# Patient Record
Sex: Male | Born: 1955 | Hispanic: No | Marital: Married | Smoking: Current every day smoker
Health system: Southern US, Community
[De-identification: ages and names within clinical notes are randomized; demographics above are authoritative.]

## PROBLEM LIST (undated history)

## (undated) DIAGNOSIS — F329 Major depressive disorder, single episode, unspecified: Secondary | ICD-10-CM

## (undated) DIAGNOSIS — J439 Emphysema, unspecified: Secondary | ICD-10-CM

## (undated) DIAGNOSIS — M1990 Unspecified osteoarthritis, unspecified site: Secondary | ICD-10-CM

## (undated) DIAGNOSIS — J449 Chronic obstructive pulmonary disease, unspecified: Secondary | ICD-10-CM

## (undated) DIAGNOSIS — N200 Calculus of kidney: Secondary | ICD-10-CM

## (undated) DIAGNOSIS — I219 Acute myocardial infarction, unspecified: Secondary | ICD-10-CM

## (undated) DIAGNOSIS — F32A Depression, unspecified: Secondary | ICD-10-CM

## (undated) DIAGNOSIS — M109 Gout, unspecified: Secondary | ICD-10-CM

## (undated) HISTORY — DX: Gout, unspecified: M10.9

## (undated) HISTORY — DX: Depression, unspecified: F32.A

## (undated) HISTORY — DX: Unspecified osteoarthritis, unspecified site: M19.90

## (undated) HISTORY — DX: Emphysema, unspecified: J43.9

## (undated) HISTORY — DX: Chronic obstructive pulmonary disease, unspecified: J44.9

## (undated) HISTORY — DX: Acute myocardial infarction, unspecified: I21.9

## (undated) HISTORY — DX: Calculus of kidney: N20.0

## (undated) HISTORY — PX: CARDIAC CATHETERIZATION: SHX172

---
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
## 2019-09-12 ENCOUNTER — Ambulatory Visit: Payer: HRSA Program | Attending: Internal Medicine

## 2019-09-12 DIAGNOSIS — Z20822 Contact with and (suspected) exposure to covid-19: Secondary | ICD-10-CM

## 2019-09-12 DIAGNOSIS — Z20828 Contact with and (suspected) exposure to other viral communicable diseases: Secondary | ICD-10-CM | POA: Insufficient documentation

## 2019-09-13 LAB — NOVEL CORONAVIRUS, NAA: SARS-CoV-2, NAA: NOT DETECTED

## 2019-09-25 ENCOUNTER — Telehealth: Payer: Self-pay

## 2019-09-25 NOTE — Telephone Encounter (Signed)
Confirmed appointment with patient. klh °

## 2019-09-27 ENCOUNTER — Encounter (INDEPENDENT_AMBULATORY_CARE_PROVIDER_SITE_OTHER): Payer: Self-pay

## 2019-09-27 ENCOUNTER — Other Ambulatory Visit: Payer: Self-pay

## 2019-09-27 ENCOUNTER — Ambulatory Visit: Payer: Medicare Other | Admitting: Adult Health

## 2019-09-27 ENCOUNTER — Encounter: Payer: Self-pay | Admitting: Adult Health

## 2019-09-27 VITALS — BP 118/73 | HR 60 | Temp 96.7°F | Resp 16 | Ht 73.0 in | Wt 231.0 lb

## 2019-09-27 DIAGNOSIS — J449 Chronic obstructive pulmonary disease, unspecified: Secondary | ICD-10-CM | POA: Diagnosis not present

## 2019-09-27 DIAGNOSIS — Z0000 Encounter for general adult medical examination without abnormal findings: Secondary | ICD-10-CM | POA: Diagnosis not present

## 2019-09-27 DIAGNOSIS — F17210 Nicotine dependence, cigarettes, uncomplicated: Secondary | ICD-10-CM

## 2019-09-27 DIAGNOSIS — R5383 Other fatigue: Secondary | ICD-10-CM

## 2019-09-27 DIAGNOSIS — M1712 Unilateral primary osteoarthritis, left knee: Secondary | ICD-10-CM

## 2019-09-27 MED ORDER — DICLOFENAC SODIUM 1 % EX GEL: 4.0000 g | Freq: Four times a day (QID) | CUTANEOUS | 0 refills | Status: DC

## 2019-09-27 NOTE — Progress Notes (Signed)
[facility] [place], [place] [address]  Internal MEDICINE  Office Visit Note  Patient Name: [name] [name]  [id number]  [id number]  Date of Service: 09/30/2019   Complaints/HPI Pt is here for establishment of PCP. Chief Complaint  Patient presents with  . New Patient (Initial Visit)  . Knee Pain    left   HPI  Patient is here today to establish care as a new patient. He recently moved to the area 5 weeks ago from [place]. 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 He has a sponsor now that is local and reports he has been sober for 2 years as of January 1st. His relatives such as mother and father and older brothers all suffer from addiction and still suffer. He has been diagnosed with COPD, he has been smoking since he was 64 years old but has recently started to back off. He was started on Chantix before leaving [place] and is down to smoking about 10 cigarettes a day. He has recently stopped taking Chantix as he was starting to have terrible nightmares as a side effect from the medication. He has never used a daily inhaler for his COPD but had an albuterol inhaler that he used on as needed basis, but has been without a prescription for several months. Feels as though his COPD is well controlled without the use of a daily inhaler. Reports a "smokers" cough that has been persistent for the last year, non-productive cough. He has not tried any treatments for the cough. Was treated in [place] for hepatitis C, completed the treatment course. His only acute complaint today is left knee pain, he was told in [place] that he had arthritis in that knee and was given a cortisone injection about 3-4 months ago. He feels as though that relieved his pain but it is starting to slowly  come back. He reports taking ibuprofen as well as tylenol arthritis OTC for his knee pain that provides him with some relief. He would like to see about getting another cortisone injection, I explained to him we would need to refer him to orthopaedics for this.   Current Medication: Outpatient Encounter Medications as of 09/27/2019  Medication Sig  . ibuprofen (ADVIL) 800 MG tablet Take 800 mg by mouth 2 (two) times daily as needed.  . diclofenac Sodium (VOLTAREN) 1 % GEL Apply 4 g topically 4 (four) times daily.   No facility-administered encounter medications on file as of 09/27/2019.    Surgical History: History reviewed. No pertinent surgical history.  Medical History: Past Medical History:  Diagnosis Date  . Arthritis   . COPD (chronic obstructive pulmonary disease) ([place])   . Depression   . Emphysema of lung ([place])     Family History: Family History  Problem Relation Age of Onset  . Alcohol abuse Mother   . Alcohol abuse Father   . Alcohol abuse Brother     Social History   Socioeconomic History  . Marital status: Single    Spouse name: Not on file  . Number of children: Not on file  . Years of education: Not on file  . Highest education level: Not on file  Occupational History  . Not on file  Tobacco Use  . Smoking status: Current Every Day Smoker  Types: Cigarettes  . Smokeless tobacco: Never Used  . Tobacco comment: 10 a day   Substance and Sexual Activity  . Alcohol use: Not Currently  . Drug use: Never  . Sexual activity: Not on file  Other Topics Concern  . Not on file  Social History Narrative  . Not on file   Social Determinants of Health   Financial Resource Strain:   . Difficulty of Paying Living Expenses: Not on file  Food Insecurity:   . Worried About Charity fundraiser in the Last Year: Not on file  . Ran Out of Food in the Last Year: Not on file  Transportation Needs:   . Lack of Transportation (Medical): Not on file  . Lack of  Transportation (Non-Medical): Not on file  Physical Activity:   . Days of Exercise per Week: Not on file  . Minutes of Exercise per Session: Not on file  Stress:   . Feeling of Stress : Not on file  Social Connections:   . Frequency of Communication with Friends and Family: Not on file  . Frequency of Social Gatherings with Friends and Family: Not on file  . Attends Religious Services: Not on file  . Active Member of Clubs or Organizations: Not on file  . Attends Archivist Meetings: Not on file  . Marital Status: Not on file  Intimate Partner Violence:   . Fear of Current or Ex-Partner: Not on file  . Emotionally Abused: Not on file  . Physically Abused: Not on file  . Sexually Abused: Not on file     Review of Systems  Constitutional: Negative.  Negative for chills, fatigue and unexpected weight change.  HENT: Negative.  Negative for congestion, rhinorrhea, sneezing and sore throat.   Eyes: Negative for redness.  Respiratory: Negative.  Negative for cough, chest tightness and shortness of breath.   Cardiovascular: Negative.  Negative for chest pain and palpitations.  Gastrointestinal: Negative.  Negative for abdominal pain, constipation, diarrhea, nausea and vomiting.  Endocrine: Negative.   Genitourinary: Negative.  Negative for dysuria and frequency.  Musculoskeletal: Negative.  Negative for arthralgias, back pain, joint swelling and neck pain.       Left knee pain  Skin: Negative.  Negative for rash.  Allergic/Immunologic: Negative.   Neurological: Negative.  Negative for tremors and numbness.  Hematological: Negative for adenopathy. Does not bruise/bleed easily.  Psychiatric/Behavioral: Negative.  Negative for behavioral problems, sleep disturbance and suicidal ideas. The patient is not nervous/anxious.       Vital Signs: BP 118/73   Pulse 60   Temp (!) 96.7 F (35.9 C)   Resp 16   Ht 6\' 1"  (1.854 m)   Wt 231 lb (104.8 kg)   SpO2 97%   BMI 30.48 kg/m     Physical Exam Vitals and nursing note reviewed.  Constitutional:      General: He is not in acute distress.    Appearance: He is well-developed. He is not diaphoretic.  HENT:     Head: Normocephalic and atraumatic.     Mouth/Throat:     Pharynx: No oropharyngeal exudate.  Eyes:     Pupils: Pupils are equal, round, and reactive to light.  Neck:     Thyroid: No thyromegaly.     Vascular: No JVD.     Trachea: No tracheal deviation.  Cardiovascular:     Rate and Rhythm: Normal rate and regular rhythm.     Heart sounds: Normal heart sounds. No murmur.  No friction rub. No gallop.   Pulmonary:     Effort: Pulmonary effort is normal. No respiratory distress.     Breath sounds: Normal breath sounds. No wheezing or rales.  Chest:     Chest wall: No tenderness.  Abdominal:     Palpations: Abdomen is soft.     Tenderness: There is no abdominal tenderness. There is no guarding.  Musculoskeletal:        General: Normal range of motion.     Cervical back: Normal range of motion and neck supple.  Lymphadenopathy:     Cervical: No cervical adenopathy.  Skin:    General: Skin is warm and dry.  Neurological:     Mental Status: He is alert and oriented to person, place, and time.     Cranial Nerves: No cranial nerve deficit.  Psychiatric:        Behavior: Behavior normal.        Thought Content: Thought content normal.        Judgment: Judgment normal.      Assessment/Plan: 1. Chronic obstructive pulmonary disease, unspecified COPD type ([place]) Cigarette smoker since age of 25, never used a daily inhaler has only been prescribed albuterol as a rescue inhaler that he rarely used. Does not have a current prescription for albuterol inhaler. States his last PFT was about a year ago. - Pulmonary function test; Future  2. Arthritis of left knee Had a cortisone injection 3-4 months ago in [place]. Is not ready at this point for surgery as he would like to continue with the cortisone  injections. - Ambulatory referral to Orthopedic Surgery  3. Cigarette nicotine dependence without complication Smoking cessation counseling: 1. Pt acknowledges the risks of long term smoking, she will try to quite smoking. 2. Options for different medications including nicotine products, chewing gum, patch etc, Wellbutrin and Chantix is discussed 3. Goal and date of compete cessation is discussed 4. Total time spent in smoking cessation is 15 min.   4. Encounter for medical examination to establish care Will follow-up with patient and schedule a physical to discuss labs at that appointment. - Lipid Panel With LDL/HDL Ratio - TSH - T4, free - Comprehensive metabolic panel - Urinalysis - PSA  5. Other fatigue - CBC with Differential/Platelet   General Counseling: Cecile verbalizes understanding of the findings of todays visit and agrees with plan of treatment. I have discussed any further diagnostic evaluation that may be needed or ordered today. We also reviewed his medications today. he has been encouraged to call the office with any questions or concerns that should arise related to todays visit.  Orders Placed This Encounter  Procedures  . CBC with Differential/Platelet  . Lipid Panel With LDL/HDL Ratio  . TSH  . T4, free  . Comprehensive metabolic panel  . Urinalysis  . PSA  . Ambulatory referral to Orthopedic Surgery  . Pulmonary function test    Meds ordered this encounter  Medications  . diclofenac Sodium (VOLTAREN) 1 % GEL    Sig: Apply 4 g topically 4 (four) times daily.    Dispense:  350 g    Refill:  0    Time spent:30 Minutes   This patient was seen by Orson Gear AGNP-C in Collaboration with Dr Lavera Guise as a part of collaborative care agreement  Kendell Bane AGNP-C Internal Medicine

## 2019-10-17 ENCOUNTER — Ambulatory Visit: Payer: Medicare Other | Admitting: Internal Medicine

## 2019-10-29 ENCOUNTER — Telehealth: Payer: Self-pay

## 2019-10-29 NOTE — Telephone Encounter (Signed)
Confirmed appointment on 10/31/2019 with patient and screened for covid. klh

## 2019-10-30 ENCOUNTER — Ambulatory Visit: Payer: Medicare Other | Admitting: Internal Medicine

## 2019-10-31 ENCOUNTER — Ambulatory Visit: Payer: Medicare Other | Admitting: Internal Medicine

## 2019-11-02 ENCOUNTER — Telehealth: Payer: Self-pay

## 2019-11-02 NOTE — Telephone Encounter (Signed)
Called lmom informing patient need to reschedule appointment on 11/12/2019 after the pft is done on 11/14/2019. klh

## 2019-11-03 LAB — TSH: TSH: 1.34 u[IU]/mL (ref 0.450–4.500)

## 2019-11-03 LAB — CBC WITH DIFFERENTIAL/PLATELET
Basophils Absolute: 0 10*3/uL (ref 0.0–0.2)
Basos: 0 %
EOS (ABSOLUTE): 0.2 10*3/uL (ref 0.0–0.4)
Eos: 2 %
Hematocrit: 47.7 % (ref 37.5–51.0)
Hemoglobin: 15.8 g/dL (ref 13.0–17.7)
Immature Grans (Abs): 0 10*3/uL (ref 0.0–0.1)
Immature Granulocytes: 0 %
Lymphocytes Absolute: 2.1 10*3/uL (ref 0.7–3.1)
Lymphs: 25 %
MCH: 29 pg (ref 26.6–33.0)
MCHC: 33.1 g/dL (ref 31.5–35.7)
MCV: 88 fL (ref 79–97)
Monocytes Absolute: 0.7 10*3/uL (ref 0.1–0.9)
Monocytes: 9 %
Neutrophils Absolute: 5.4 10*3/uL (ref 1.4–7.0)
Neutrophils: 64 %
Platelets: 204 10*3/uL (ref 150–450)
RBC: 5.44 x10E6/uL (ref 4.14–5.80)
RDW: 12.6 % (ref 11.6–15.4)
WBC: 8.4 10*3/uL (ref 3.4–10.8)

## 2019-11-03 LAB — COMPREHENSIVE METABOLIC PANEL
ALT: 26 IU/L (ref 0–44)
AST: 17 IU/L (ref 0–40)
Albumin/Globulin Ratio: 1.8 (ref 1.2–2.2)
Albumin: 4.7 g/dL (ref 3.8–4.8)
Alkaline Phosphatase: 80 IU/L (ref 39–117)
BUN/Creatinine Ratio: 13 (ref 10–24)
BUN: 15 mg/dL (ref 8–27)
Bilirubin Total: 0.2 mg/dL (ref 0.0–1.2)
CO2: 26 mmol/L (ref 20–29)
Calcium: 9.3 mg/dL (ref 8.6–10.2)
Chloride: 102 mmol/L (ref 96–106)
Creatinine, Ser: 1.15 mg/dL (ref 0.76–1.27)
GFR calc Af Amer: 76 mL/min/{1.73_m2} (ref >59)
GFR calc non Af Amer: 66 mL/min/{1.73_m2} (ref >59)
Globulin, Total: 2.6 g/dL (ref 1.5–4.5)
Glucose: 84 mg/dL (ref 65–99)
Potassium: 4.4 mmol/L (ref 3.5–5.2)
Sodium: 140 mmol/L (ref 134–144)
Total Protein: 7.3 g/dL (ref 6.0–8.5)

## 2019-11-03 LAB — LIPID PANEL WITH LDL/HDL RATIO
Cholesterol, Total: 167 mg/dL (ref 100–199)
HDL: 38 mg/dL — ABNORMAL LOW (ref >39)
LDL Chol Calc (NIH): 108 mg/dL — ABNORMAL HIGH (ref 0–99)
LDL/HDL Ratio: 2.8 ratio (ref 0.0–3.6)
Triglycerides: 118 mg/dL (ref 0–149)
VLDL Cholesterol Cal: 21 mg/dL (ref 5–40)

## 2019-11-03 LAB — PSA: Prostate Specific Ag, Serum: 7.5 ng/mL — ABNORMAL HIGH (ref 0.0–4.0)

## 2019-11-03 LAB — T4, FREE: Free T4: 1.28 ng/dL (ref 0.82–1.77)

## 2019-11-06 ENCOUNTER — Telehealth: Payer: Self-pay

## 2019-11-06 NOTE — Telephone Encounter (Signed)
Called confirmed appointment on 11/08/2019 and screened for covid. klh 

## 2019-11-07 ENCOUNTER — Telehealth: Payer: Self-pay

## 2019-11-07 NOTE — Telephone Encounter (Signed)
Confirmed virtual visit on 11/08/2019. klh

## 2019-11-08 ENCOUNTER — Ambulatory Visit: Payer: Medicare Other | Admitting: Adult Health

## 2019-11-08 ENCOUNTER — Other Ambulatory Visit: Payer: Self-pay

## 2019-11-12 ENCOUNTER — Ambulatory Visit: Payer: Medicare Other | Admitting: Internal Medicine

## 2019-11-14 ENCOUNTER — Other Ambulatory Visit: Payer: Self-pay

## 2019-11-14 ENCOUNTER — Other Ambulatory Visit: Payer: Self-pay | Admitting: Adult Health

## 2019-11-14 ENCOUNTER — Ambulatory Visit: Payer: Medicare Other | Admitting: Internal Medicine

## 2019-11-14 DIAGNOSIS — R0602 Shortness of breath: Secondary | ICD-10-CM

## 2019-11-14 DIAGNOSIS — R972 Elevated prostate specific antigen [PSA]: Secondary | ICD-10-CM

## 2019-11-14 LAB — PULMONARY FUNCTION TEST

## 2019-11-14 MED ORDER — BUPROPION HCL ER (SR) 150 MG PO TB12: 150.0000 mg | ORAL_TABLET | Freq: Two times a day (BID) | ORAL | 2 refills | Status: DC

## 2019-11-14 NOTE — Progress Notes (Signed)
Spoke with patient about elevated PSA.  Sent referral for urology.  Also sent RX for Wellbutrin to assist with smoking cessation per patient request.

## 2019-11-17 NOTE — Procedures (Signed)
[place], [id number]  DATE OF SERVICE: November 14, 2019  Complete Pulmonary Function Testing Interpretation:  FINDINGS:  Forced vital capacity is normal.  The FEV1 is normal.  FEV1 FVC ratio is normal.  Postbronchodilator no significant change in the FEV1 clinical improvement may still occur in the absence of spirometric improvement.  Total lung capacity is normal.  Residual volume is normal.  Residual in total lung capacity ratio is increased.  FRC is normal.  DLCO is moderately decreased.  IMPRESSION:  Pulmonary function study is consistent with normal spirometry and lung volumes the DLCO is moderately decreased.  Clinical correlation is recommended  Allyne Gee, MD [facility] Pulmonary Critical Care Medicine Sleep Medicine

## 2019-11-29 ENCOUNTER — Telehealth: Payer: Self-pay

## 2019-11-29 NOTE — Telephone Encounter (Signed)
Called lmom informing patient of appointment on 12/03/2019. klh

## 2019-12-03 ENCOUNTER — Encounter: Payer: Self-pay | Admitting: Adult Health

## 2019-12-03 ENCOUNTER — Ambulatory Visit (INDEPENDENT_AMBULATORY_CARE_PROVIDER_SITE_OTHER): Payer: Medicare Other | Admitting: Adult Health

## 2019-12-03 ENCOUNTER — Other Ambulatory Visit: Payer: Self-pay

## 2019-12-03 VITALS — BP 116/67 | HR 63 | Temp 97.1°F | Resp 16 | Ht 73.0 in | Wt 227.0 lb

## 2019-12-03 DIAGNOSIS — R300 Dysuria: Secondary | ICD-10-CM | POA: Diagnosis not present

## 2019-12-03 DIAGNOSIS — F410 Panic disorder [episodic paroxysmal anxiety] without agoraphobia: Secondary | ICD-10-CM | POA: Diagnosis not present

## 2019-12-03 DIAGNOSIS — R972 Elevated prostate specific antigen [PSA]: Secondary | ICD-10-CM | POA: Diagnosis not present

## 2019-12-03 DIAGNOSIS — Z0001 Encounter for general adult medical examination with abnormal findings: Secondary | ICD-10-CM | POA: Diagnosis not present

## 2019-12-03 DIAGNOSIS — F17210 Nicotine dependence, cigarettes, uncomplicated: Secondary | ICD-10-CM | POA: Diagnosis not present

## 2019-12-03 DIAGNOSIS — R0602 Shortness of breath: Secondary | ICD-10-CM

## 2019-12-03 MED ORDER — BUSPIRONE HCL 10 MG PO TABS: 10.0000 mg | ORAL_TABLET | Freq: Two times a day (BID) | ORAL | 1 refills | Status: DC

## 2019-12-03 NOTE — Progress Notes (Signed)
[facility] [place], [place] [address]  Internal MEDICINE  Office Visit Note  Patient Name: [name] [name]  [id number]  [id number]  Date of Service: 12/30/2019  Chief Complaint  Patient presents with  . Medicare Wellness    test results  . Depression     HPI Pt is here for routine health maintenance examination. He expresses concern today about anxiety/panic attacks. He is struggling with not being able to see his teenage son due to issues with his ex-wife and financial strain. He reports feeling his body jump and having extreme anxiety during the day, while resting. He reports he is sleeping well at this time. His blood pressure is stable today, denies chest pain or palpitations. Currently smoking 0.5 pack of cigarettes per day, has shortness of breath at baseline but this has been stable over time, not on oxygen therapy or using any inhalers. Scheduled to see urologist the first of next month due to elevated PSA levels on routine blood work, also adding to his anxiety.  Current Medication: Outpatient Encounter Medications as of 12/03/2019  Medication Sig  . buPROPion (WELLBUTRIN SR) 150 MG 12 hr tablet Take 1 tablet (150 mg total) by mouth 2 (two) times daily.  . diclofenac Sodium (VOLTAREN) 1 % GEL Apply 4 g topically 4 (four) times daily.  Marland Kitchen ibuprofen (ADVIL) 800 MG tablet Take 800 mg by mouth 2 (two) times daily as needed.  . busPIRone (BUSPAR) 10 MG tablet Take 1 tablet (10 mg total) by mouth 2 (two) times daily.   No facility-administered encounter medications on file as of 12/03/2019.    Surgical History: History reviewed. No pertinent surgical history.  Medical History: Past Medical History:  Diagnosis Date  . Arthritis   . COPD (chronic obstructive pulmonary disease) ([place])   . Depression   . Emphysema of lung ([place])     Family History: Family History  Problem Relation Age of Onset  . Alcohol abuse Mother   . Alcohol abuse Father    . Alcohol abuse Brother       Review of Systems  Constitutional: Negative.  Negative for chills, fatigue and unexpected weight change.  HENT: Negative.  Negative for congestion, rhinorrhea, sneezing and sore throat.   Eyes: Negative for redness.  Respiratory: Positive for shortness of breath. Negative for cough and chest tightness.   Cardiovascular: Negative.  Negative for chest pain and palpitations.  Gastrointestinal: Negative.  Negative for abdominal pain, constipation, diarrhea, nausea and vomiting.  Endocrine: Negative.   Genitourinary: Negative.  Negative for dysuria and frequency.  Musculoskeletal: Negative.  Negative for arthralgias, back pain, joint swelling and neck pain.  Skin: Negative.  Negative for rash.  Allergic/Immunologic: Negative.   Neurological: Negative.  Negative for tremors and numbness.  Hematological: Negative for adenopathy. Does not bruise/bleed easily.  Psychiatric/Behavioral: Negative for behavioral problems, sleep disturbance and suicidal ideas. The patient is nervous/anxious.        Panic attacks     Vital Signs: BP 116/67   Pulse 63   Temp (!) 97.1 F (36.2 C)   Resp 16   Ht 6\' 1"  (1.854 m)   Wt 227 lb (103 kg)   SpO2 98%   BMI 29.95 kg/m    Physical Exam Vitals and nursing note reviewed.  Constitutional:      General: He is not in acute distress.    Appearance: He is well-developed. He is not diaphoretic.  HENT:     Head: Normocephalic and atraumatic.  Mouth/Throat:     Pharynx: No oropharyngeal exudate.  Eyes:     Pupils: Pupils are equal, round, and reactive to light.  Neck:     Thyroid: No thyromegaly.     Vascular: No JVD.     Trachea: No tracheal deviation.  Cardiovascular:     Rate and Rhythm: Normal rate and regular rhythm.     Heart sounds: Normal heart sounds. No murmur. No friction rub. No gallop.   Pulmonary:     Effort: Pulmonary effort is normal. No respiratory distress.     Breath sounds: Normal breath  sounds. No wheezing or rales.  Chest:     Chest wall: No tenderness.  Abdominal:     Palpations: Abdomen is soft.     Tenderness: There is no abdominal tenderness. There is no guarding.  Musculoskeletal:        General: Normal range of motion.     Cervical back: Normal range of motion and neck supple.  Lymphadenopathy:     Cervical: No cervical adenopathy.  Skin:    General: Skin is warm and dry.  Neurological:     Mental Status: He is alert and oriented to person, place, and time.     Cranial Nerves: No cranial nerve deficit.  Psychiatric:        Behavior: Behavior normal.        Thought Content: Thought content normal.        Judgment: Judgment normal.      LABS: Recent Results (from the past 2160 hour(s))  CBC with Differential/Platelet     Status: None   Collection Time: 11/02/19  3:53 PM  Result Value Ref Range   WBC 8.4 3.4 - 10.8 x10E3/uL   RBC 5.44 4.14 - 5.80 x10E6/uL   Hemoglobin 15.8 13.0 - 17.7 g/dL   Hematocrit 47.7 37.5 - 51.0 %   MCV 88 79 - 97 fL   MCH 29.0 26.6 - 33.0 pg   MCHC 33.1 31.5 - 35.7 g/dL   RDW 12.6 11.6 - 15.4 %   Platelets 204 150 - 450 x10E3/uL   Neutrophils 64 Not Estab. %   Lymphs 25 Not Estab. %   Monocytes 9 Not Estab. %   Eos 2 Not Estab. %   Basos 0 Not Estab. %   Neutrophils Absolute 5.4 1.4 - 7.0 x10E3/uL   Lymphocytes Absolute 2.1 0.7 - 3.1 x10E3/uL   Monocytes Absolute 0.7 0.1 - 0.9 x10E3/uL   EOS (ABSOLUTE) 0.2 0.0 - 0.4 x10E3/uL   Basophils Absolute 0.0 0.0 - 0.2 x10E3/uL   Immature Granulocytes 0 Not Estab. %   Immature Grans (Abs) 0.0 0.0 - 0.1 x10E3/uL  Lipid Panel With LDL/HDL Ratio     Status: Abnormal   Collection Time: 11/02/19  3:53 PM  Result Value Ref Range   Cholesterol, Total 167 100 - 199 mg/dL   Triglycerides 118 0 - 149 mg/dL   HDL 38 (L) >39 mg/dL   VLDL Cholesterol Cal 21 5 - 40 mg/dL   LDL Chol Calc (NIH) 108 (H) 0 - 99 mg/dL   LDL/HDL Ratio 2.8 0.0 - 3.6 ratio    Comment:                                      LDL/HDL Ratio  Men  Women                               1/2 Avg.Risk  1.0    1.5                                   Avg.Risk  3.6    3.2                                2X Avg.Risk  6.2    5.0                                3X Avg.Risk  8.0    6.1   TSH     Status: None   Collection Time: 11/02/19  3:53 PM  Result Value Ref Range   TSH 1.340 0.450 - 4.500 uIU/mL  T4, free     Status: None   Collection Time: 11/02/19  3:53 PM  Result Value Ref Range   Free T4 1.28 0.82 - 1.77 ng/dL  Comprehensive metabolic panel     Status: None   Collection Time: 11/02/19  3:53 PM  Result Value Ref Range   Glucose 84 65 - 99 mg/dL   BUN 15 8 - 27 mg/dL   Creatinine, Ser 1.15 0.76 - 1.27 mg/dL   GFR calc non Af Amer 66 >59 mL/min/1.73   GFR calc Af Amer 76 >59 mL/min/1.73   BUN/Creatinine Ratio 13 10 - 24   Sodium 140 134 - 144 mmol/L   Potassium 4.4 3.5 - 5.2 mmol/L   Chloride 102 96 - 106 mmol/L   CO2 26 20 - 29 mmol/L   Calcium 9.3 8.6 - 10.2 mg/dL   Total Protein 7.3 6.0 - 8.5 g/dL   Albumin 4.7 3.8 - 4.8 g/dL   Globulin, Total 2.6 1.5 - 4.5 g/dL   Albumin/Globulin Ratio 1.8 1.2 - 2.2   Bilirubin Total 0.2 0.0 - 1.2 mg/dL   Alkaline Phosphatase 80 39 - 117 IU/L   AST 17 0 - 40 IU/L   ALT 26 0 - 44 IU/L  PSA     Status: Abnormal   Collection Time: 11/02/19  3:53 PM  Result Value Ref Range   Prostate Specific Ag, Serum 7.5 (H) 0.0 - 4.0 ng/mL    Comment: Roche ECLIA methodology. According to the American Urological Association, Serum PSA should decrease and remain at undetectable levels after radical prostatectomy. The AUA defines biochemical recurrence as an initial PSA value 0.2 ng/mL or greater followed by a subsequent confirmatory PSA value 0.2 ng/mL or greater. Values obtained with different assay methods or kits cannot be used interchangeably. Results cannot be interpreted as absolute evidence of the presence or absence of  malignant disease.   UA/M w/rflx Culture, Routine     Status: None   Collection Time: 12/03/19 12:00 AM   Specimen: Urine   URINE  Result Value Ref Range   Specific Gravity, UA 1.017 1.005 - 1.030   pH, UA 5.5 5.0 - 7.5   Color, UA Yellow Yellow   Appearance Ur Clear Clear   Leukocytes,UA Negative Negative   Protein,UA Negative Negative/Trace   Glucose, UA Negative Negative   Ketones, UA Negative Negative   RBC,  UA Negative Negative   Bilirubin, UA Negative Negative   Urobilinogen, Ur 1.0 0.2 - 1.0 mg/dL   Nitrite, UA Negative Negative   Microscopic Examination Comment     Comment: Microscopic follows if indicated.   Microscopic Examination See below:     Comment: Microscopic was indicated and was performed.   Urinalysis Reflex Comment     Comment: This specimen will not reflex to a Urine Culture.  Microscopic Examination     Status: None   Collection Time: 12/03/19 12:00 AM   URINE  Result Value Ref Range   WBC, UA 0-5 0 - 5 /hpf   RBC None seen 0 - 2 /hpf   Epithelial Cells (non renal) None seen 0 - 10 /hpf   Casts None seen None seen /lpf   Bacteria, UA None seen None seen/Few    Assessment/Plan: 1. Encounter for general adult medical examination with abnormal findings Well appearing 64 year old male, up to date on PHM.  2. Cigarette nicotine dependence without complication Smoking cessation counseling: 1. Pt acknowledges the risks of long term smoking, she will try to quite smoking. 2. Options for different medications including nicotine products, chewing gum, patch etc, Wellbutrin and Chantix is discussed 3. Goal and date of compete cessation is discussed 4. Total time spent in smoking cessation is 15 min.  3. Anxiety attack Has baseline anxiety that has been controlled with Wellbutrin for many years. Reports several new stressors in his life at this time that are causing panic attack symptoms. Will add Buspar to his daily routine to help with his overall anxiety  level, will follow-up. - busPIRone (BUSPAR) 10 MG tablet; Take 1 tablet (10 mg total) by mouth 2 (two) times daily.  Dispense: 60 tablet; Refill: 1  4. Elevated PSA PSA elevated on routine blood work, scheduled to see urologist beginning of April, will schedule next follow-up appointment closely after urology referral.  5. Shortness of breath Stable at this time, not requiring oxygen therapy or use of inhalers. Will continue to monitor. Most recent PFT WNL.  6. Dysuria - UA/M w/rflx Culture, Routine  General Counseling: [name] [name] verbalizes understanding of the findings of todays visit and agrees with plan of treatment. I have discussed any further diagnostic evaluation that may be needed or ordered today. We also reviewed his medications today. he has been encouraged to call the office with any questions or concerns that should arise related to todays visit.   Orders Placed This Encounter  Procedures  . Microscopic Examination  . UA/M w/rflx Culture, Routine    Meds ordered this encounter  Medications  . busPIRone (BUSPAR) 10 MG tablet    Sig: Take 1 tablet (10 mg total) by mouth 2 (two) times daily.    Dispense:  60 tablet    Refill:  1    Time spent: 30 Minutes   This patient was seen by Orson Gear AGNP-C in Collaboration with Dr Lavera Guise as a part of collaborative care agreement    Kendell Bane AGNP-C Internal Medicine

## 2019-12-04 LAB — MICROSCOPIC EXAMINATION
Bacteria, UA: NONE SEEN
Casts: NONE SEEN /lpf
Epithelial Cells (non renal): NONE SEEN /hpf (ref 0–10)
RBC, Urine: NONE SEEN /hpf (ref 0–2)

## 2019-12-04 LAB — UA/M W/RFLX CULTURE, ROUTINE
Bilirubin, UA: NEGATIVE
Glucose, UA: NEGATIVE
Ketones, UA: NEGATIVE
Leukocytes,UA: NEGATIVE
Nitrite, UA: NEGATIVE
Protein,UA: NEGATIVE
RBC, UA: NEGATIVE
Specific Gravity, UA: 1.017 (ref 1.005–1.030)
Urobilinogen, Ur: 1 mg/dL (ref 0.2–1.0)
pH, UA: 5.5 (ref 5.0–7.5)

## 2019-12-24 ENCOUNTER — Ambulatory Visit: Payer: Medicare Other | Admitting: Internal Medicine

## 2019-12-26 ENCOUNTER — Other Ambulatory Visit: Payer: Self-pay

## 2019-12-26 ENCOUNTER — Ambulatory Visit (INDEPENDENT_AMBULATORY_CARE_PROVIDER_SITE_OTHER): Payer: Medicare Other | Admitting: Urology

## 2019-12-26 ENCOUNTER — Encounter: Payer: Self-pay | Admitting: Urology

## 2019-12-26 VITALS — BP 120/82 | HR 64 | Ht 73.0 in | Wt 225.0 lb

## 2019-12-26 DIAGNOSIS — N401 Enlarged prostate with lower urinary tract symptoms: Secondary | ICD-10-CM | POA: Diagnosis not present

## 2019-12-26 DIAGNOSIS — R972 Elevated prostate specific antigen [PSA]: Secondary | ICD-10-CM | POA: Insufficient documentation

## 2019-12-26 DIAGNOSIS — R350 Frequency of micturition: Secondary | ICD-10-CM | POA: Diagnosis not present

## 2019-12-26 MED ORDER — TAMSULOSIN HCL 0.4 MG PO CAPS: 0.4000 mg | ORAL_CAPSULE | Freq: Every day | ORAL | 0 refills | Status: DC

## 2019-12-26 NOTE — Progress Notes (Signed)
12/26/2019 8:41 AM   [name] [name] 1956-03-19 [id number]  Referring provider: Kendell Bane, NP [place],  [place] [address]  Chief Complaint  Patient presents with  . Elevated PSA    HPI: [name] [name] is a 64 y.o. male seen at the request of [name] Lacks, NP for evaluation of an elevated PSA.  -PSA 11/02/2019 elevated at 7.5 -No prior PSA results for comparison -Denies history elevated PSA or prior urologic problems -Also complains of urinary frequency, urgency, nocturia x2 -Denies UTI, dysuria, gross hematuria -Denies flank, abdominal or pelvic pain   PMH: Past Medical History:  Diagnosis Date  . Arthritis   . COPD (chronic obstructive pulmonary disease) ([place])   . Depression   . Emphysema of lung [facility])     Surgical History: No past surgical history on file.  Home Medications:  Allergies as of 12/26/2019   No Known Allergies     Medication List       Accurate as of December 26, 2019  8:41 AM. If you have any questions, ask your nurse or doctor.        buPROPion 150 MG 12 hr tablet Commonly known as: Wellbutrin SR Take 1 tablet (150 mg total) by mouth 2 (two) times daily.   busPIRone 10 MG tablet Commonly known as: BUSPAR Take 1 tablet (10 mg total) by mouth 2 (two) times daily.   diclofenac Sodium 1 % Gel Commonly known as: Voltaren Apply 4 g topically 4 (four) times daily.   ibuprofen 800 MG tablet Commonly known as: ADVIL Take 800 mg by mouth 2 (two) times daily as needed.       Allergies: No Known Allergies  Family History: Family History  Problem Relation Age of Onset  . Alcohol abuse Mother   . Alcohol abuse Father   . Alcohol abuse Brother     Social History:  reports that he has been smoking cigarettes. He has never used smokeless tobacco. He reports previous alcohol use. He reports that he does not use drugs.   Physical Exam: BP 120/82   Pulse 64   Ht 6\' 1"  (1.854 m)   Wt 225 lb (102.1 kg)   BMI 29.69  kg/m   Constitutional:  Alert and oriented, No acute distress. HEENT: [place] AT, moist mucus membranes.  Trachea midline, no masses. Cardiovascular: No clubbing, cyanosis, or edema. Respiratory: Normal respiratory effort, no increased work of breathing. GU: Prostate 60+ grams, smooth without nodules Skin: No rashes, bruises or suspicious lesions. Neurologic: Grossly intact, no focal deficits, moving all 4 extremities. Psychiatric: Normal mood and affect.   Assessment & Plan:    - Elevated PSA Although PSA is a prostate cancer screening test he was informed that cancer is not the most common cause of an elevated PSA. Other potential causes including BPH and inflammation were discussed. He was informed that the only way to adequately diagnose prostate cancer would be a transrectal ultrasound and biopsy of the prostate. The procedure was discussed including potential risks of bleeding and infection/sepsis. He was also informed that a negative biopsy does not conclusively rule out the possibility that prostate cancer may be present and that continued monitoring is required. The use of newer adjunctive blood tests including PHI and 4kScore were discussed. The use of multiparametric prostate MRI was also discussed however is not typically used for initial evaluation of an elevated PSA. Continued periodic surveillance was also discussed.  Based on his urinary symptoms will start tamsulosin 0.4 mg daily  and obtain a repeat PSA in 1 month.  If PSA remains elevated would recommend prostate MRI.  - Urinary frequency/urgency Most likely secondary to BPH.  Rx tamsulosin as above.   Abbie Sons, [place] [address] [place], [place] [address] [phone]

## 2020-01-10 ENCOUNTER — Telehealth: Payer: Self-pay

## 2020-01-10 NOTE — Telephone Encounter (Signed)
Confirmed appointment on 01/14/2020 and screened for covid. klh 

## 2020-01-14 ENCOUNTER — Other Ambulatory Visit: Payer: Self-pay

## 2020-01-14 ENCOUNTER — Encounter: Payer: Self-pay | Admitting: Adult Health

## 2020-01-14 ENCOUNTER — Ambulatory Visit: Payer: Medicare Other | Admitting: Adult Health

## 2020-01-14 VITALS — BP 94/66 | HR 65 | Temp 97.2°F | Resp 16 | Ht 73.0 in | Wt 223.6 lb

## 2020-01-14 DIAGNOSIS — N528 Other male erectile dysfunction: Secondary | ICD-10-CM

## 2020-01-14 DIAGNOSIS — J449 Chronic obstructive pulmonary disease, unspecified: Secondary | ICD-10-CM

## 2020-01-14 DIAGNOSIS — R972 Elevated prostate specific antigen [PSA]: Secondary | ICD-10-CM

## 2020-01-14 DIAGNOSIS — F17210 Nicotine dependence, cigarettes, uncomplicated: Secondary | ICD-10-CM

## 2020-01-14 MED ORDER — SILDENAFIL CITRATE 100 MG PO TABS: 100.0000 mg | ORAL_TABLET | Freq: Every day | ORAL | 0 refills | Status: DC | PRN

## 2020-01-14 NOTE — Progress Notes (Signed)
[facility], [place] [address]  Internal MEDICINE  Office Visit Note  Patient Name: [name] [name]  [id number]  [id number]  Date of Service: 01/28/2020  Chief Complaint  Patient presents with  . Follow-up  . Depression    HPI  Pt is here to follow up on depression, copd, and anxiety.  He reports he is doing well at this time. His depression/anxiety is currently treated with wellbutrin and buspar.  His copd is managed without inhalers at this time. He has an elevated PSA and has been seen by urology at this time. He reports his urinary frequency has improved with flomax since seeing urology.    Current Medication: Outpatient Encounter Medications as of 01/14/2020  Medication Sig  . buPROPion (WELLBUTRIN SR) 150 MG 12 hr tablet Take 1 tablet (150 mg total) by mouth 2 (two) times daily.  . busPIRone (BUSPAR) 10 MG tablet Take 1 tablet (10 mg total) by mouth 2 (two) times daily.  . diclofenac Sodium (VOLTAREN) 1 % GEL Apply 4 g topically 4 (four) times daily.  Marland Kitchen ibuprofen (ADVIL) 800 MG tablet Take 800 mg by mouth 2 (two) times daily as needed.  . tamsulosin (FLOMAX) 0.4 MG CAPS capsule Take 1 capsule (0.4 mg total) by mouth daily.  . sildenafil (VIAGRA) 100 MG tablet Take 1 tablet (100 mg total) by mouth daily as needed for erectile dysfunction.   No facility-administered encounter medications on file as of 01/14/2020.    Surgical History: History reviewed. No pertinent surgical history.  Medical History: Past Medical History:  Diagnosis Date  . Arthritis   . COPD (chronic obstructive pulmonary disease) ([place])   . Depression   . Emphysema of lung ([place])     Family History: Family History  Problem Relation Age of Onset  . Alcohol abuse Mother   . Alcohol abuse Father   . Alcohol abuse Brother     Social History   Socioeconomic History  . Marital status: Single    Spouse name: Not on file  . Number of children: Not on file  .  Years of education: Not on file  . Highest education level: Not on file  Occupational History  . Not on file  Tobacco Use  . Smoking status: Current Every Day Smoker    Types: Cigarettes  . Smokeless tobacco: Never Used  . Tobacco comment: 10 a day   Substance and Sexual Activity  . Alcohol use: Not Currently  . Drug use: Never  . Sexual activity: Not on file  Other Topics Concern  . Not on file  Social History Narrative  . Not on file   Social Determinants of Health   Financial Resource Strain:   . Difficulty of Paying Living Expenses:   Food Insecurity:   . Worried About Charity fundraiser in the Last Year:   . Arboriculturist in the Last Year:   Transportation Needs:   . Film/video editor (Medical):   Marland Kitchen Lack of Transportation (Non-Medical):   Physical Activity:   . Days of Exercise per Week:   . Minutes of Exercise per Session:   Stress:   . Feeling of Stress :   Social Connections:   . Frequency of Communication with Friends and Family:   . Frequency of Social Gatherings with Friends and Family:   . Attends Religious Services:   . Active Member of Clubs or Organizations:   . Attends Archivist Meetings:   .  Marital Status:   Intimate Partner Violence:   . Fear of Current or Ex-Partner:   . Emotionally Abused:   Marland Kitchen Physically Abused:   . Sexually Abused:       Review of Systems  Constitutional: Negative.  Negative for chills, fatigue and unexpected weight change.  HENT: Negative.  Negative for congestion, rhinorrhea, sneezing and sore throat.   Eyes: Negative for redness.  Respiratory: Negative.  Negative for cough, chest tightness and shortness of breath.   Cardiovascular: Negative.  Negative for chest pain and palpitations.  Gastrointestinal: Negative.  Negative for abdominal pain, constipation, diarrhea, nausea and vomiting.  Endocrine: Negative.   Genitourinary: Negative.  Negative for dysuria and frequency.  Musculoskeletal: Negative.   Negative for arthralgias, back pain, joint swelling and neck pain.  Skin: Negative.  Negative for rash.  Allergic/Immunologic: Negative.   Neurological: Negative.  Negative for tremors and numbness.  Hematological: Negative for adenopathy. Does not bruise/bleed easily.  Psychiatric/Behavioral: Negative.  Negative for behavioral problems, sleep disturbance and suicidal ideas. The patient is not nervous/anxious.     Vital Signs: BP 94/66   Pulse 65   Temp (!) 97.2 F (36.2 C)   Resp 16   Ht 6\' 1"  (1.854 m)   Wt 223 lb 9.6 oz (101.4 kg)   SpO2 95%   BMI 29.50 kg/m    Physical Exam Vitals and nursing note reviewed.  Constitutional:      General: He is not in acute distress.    Appearance: He is well-developed. He is not diaphoretic.  HENT:     Head: Normocephalic and atraumatic.     Mouth/Throat:     Pharynx: No oropharyngeal exudate.  Eyes:     Pupils: Pupils are equal, round, and reactive to light.  Neck:     Thyroid: No thyromegaly.     Vascular: No JVD.     Trachea: No tracheal deviation.  Cardiovascular:     Rate and Rhythm: Normal rate and regular rhythm.     Heart sounds: Normal heart sounds. No murmur. No friction rub. No gallop.   Pulmonary:     Effort: Pulmonary effort is normal. No respiratory distress.     Breath sounds: Normal breath sounds. No wheezing or rales.  Chest:     Chest wall: No tenderness.  Abdominal:     Palpations: Abdomen is soft.     Tenderness: There is no abdominal tenderness. There is no guarding.  Musculoskeletal:        General: Normal range of motion.     Cervical back: Normal range of motion and neck supple.  Lymphadenopathy:     Cervical: No cervical adenopathy.  Skin:    General: Skin is warm and dry.  Neurological:     Mental Status: He is alert and oriented to person, place, and time.     Cranial Nerves: No cranial nerve deficit.  Psychiatric:        Behavior: Behavior normal.        Thought Content: Thought content  normal.        Judgment: Judgment normal.     Assessment/Plan: 1. Chronic obstructive pulmonary disease, unspecified COPD type ([place]) Controlled, continue current management.  2. Elevated PSA Continue to follow up per urology recommendations.   3. Other male erectile dysfunction Use Viagra as directed.  - sildenafil (VIAGRA) 100 MG tablet; Take 1 tablet (100 mg total) by mouth daily as needed for erectile dysfunction.  Dispense: 10 tablet; Refill: 0  4. Cigarette  nicotine dependence without complication Smoking cessation counseling: 1. Pt acknowledges the risks of long term smoking, she will try to quite smoking. 2. Options for different medications including nicotine products, chewing gum, patch etc, Wellbutrin and Chantix is discussed 3. Goal and date of compete cessation is discussed 4. Total time spent in smoking cessation is 15 min.   General Counseling: Olon verbalizes understanding of the findings of todays visit and agrees with plan of treatment. I have discussed any further diagnostic evaluation that may be needed or ordered today. We also reviewed his medications today. he has been encouraged to call the office with any questions or concerns that should arise related to todays visit.    No orders of the defined types were placed in this encounter.   Meds ordered this encounter  Medications  . sildenafil (VIAGRA) 100 MG tablet    Sig: Take 1 tablet (100 mg total) by mouth daily as needed for erectile dysfunction.    Dispense:  10 tablet    Refill:  0    Time spent: 30 Minutes   This patient was seen by Orson Gear AGNP-C in Collaboration with Dr Lavera Guise as a part of collaborative care agreement     Kendell Bane AGNP-C Internal medicine

## 2020-01-24 ENCOUNTER — Other Ambulatory Visit: Payer: Self-pay

## 2020-01-24 DIAGNOSIS — R972 Elevated prostate specific antigen [PSA]: Secondary | ICD-10-CM

## 2020-01-25 ENCOUNTER — Other Ambulatory Visit: Payer: Self-pay

## 2020-01-25 ENCOUNTER — Other Ambulatory Visit: Payer: Medicare Other

## 2020-01-25 DIAGNOSIS — R972 Elevated prostate specific antigen [PSA]: Secondary | ICD-10-CM

## 2020-01-26 LAB — PSA: Prostate Specific Ag, Serum: 6.6 ng/mL — ABNORMAL HIGH (ref 0.0–4.0)

## 2020-01-27 ENCOUNTER — Other Ambulatory Visit: Payer: Self-pay | Admitting: Urology

## 2020-01-27 DIAGNOSIS — R972 Elevated prostate specific antigen [PSA]: Secondary | ICD-10-CM

## 2020-01-28 ENCOUNTER — Telehealth: Payer: Self-pay | Admitting: *Deleted

## 2020-01-28 NOTE — Telephone Encounter (Signed)
-----   Message from Abbie Sons, MD sent at 01/27/2020 11:22 AM EDT ----- PSA lower but remains elevated at 6.6.  Recommend scheduling prostate MRI.  Order was entered.

## 2020-01-28 NOTE — Telephone Encounter (Signed)
Notified patient as instructed, patient pleased. Discussed follow-up appointments, patient agrees  

## 2020-02-05 ENCOUNTER — Other Ambulatory Visit: Payer: Self-pay | Admitting: *Deleted

## 2020-02-05 MED ORDER — TAMSULOSIN HCL 0.4 MG PO CAPS: 0.4000 mg | ORAL_CAPSULE | Freq: Every day | ORAL | 0 refills | Status: DC

## 2020-02-12 ENCOUNTER — Ambulatory Visit: Admission: RE | Admit: 2020-02-12 | Payer: Medicare Other | Source: Ambulatory Visit

## 2020-02-26 ENCOUNTER — Ambulatory Visit
Admission: RE | Admit: 2020-02-26 | Discharge: 2020-02-26 | Disposition: A | Discharge status: Home / Self Care | Payer: Medicare Other | Source: Ambulatory Visit | Attending: Urology | Admitting: Urology

## 2020-02-26 ENCOUNTER — Other Ambulatory Visit: Payer: Self-pay

## 2020-02-26 DIAGNOSIS — R972 Elevated prostate specific antigen [PSA]: Secondary | ICD-10-CM | POA: Insufficient documentation

## 2020-02-26 MED ORDER — GADOBUTROL 1 MMOL/ML IV SOLN
10.0000 mL | Freq: Once | INTRAVENOUS | Status: AC | PRN
  Administered 2020-02-26: 10 mL via INTRAVENOUS

## 2020-02-28 ENCOUNTER — Telehealth: Payer: Self-pay | Admitting: Radiology

## 2020-02-28 DIAGNOSIS — R972 Elevated prostate specific antigen [PSA]: Secondary | ICD-10-CM

## 2020-02-28 DIAGNOSIS — R935 Abnormal findings on diagnostic imaging of other abdominal regions, including retroperitoneum: Secondary | ICD-10-CM

## 2020-02-28 NOTE — Telephone Encounter (Signed)
Patient would like to discuss prostate MRI results. He may be reached at [phone]. He does not currently have a follow up appointment scheduled.

## 2020-02-29 NOTE — Telephone Encounter (Signed)
Called patient and got voicemail.

## 2020-02-29 NOTE — Telephone Encounter (Signed)
Patient called office again wanting to know his MRI results.

## 2020-03-01 NOTE — Telephone Encounter (Signed)
Called again and got VM-left message

## 2020-03-03 ENCOUNTER — Telehealth: Payer: Self-pay | Admitting: Urology

## 2020-03-03 NOTE — Telephone Encounter (Signed)
MRI results discussed in detail with patient.  He has a PI-RADS 4 lesion right apical area with possible extracapsular extension.  Referral for fusion biopsy at [facility] was entered

## 2020-03-03 NOTE — Telephone Encounter (Signed)
Patient called the after hours line on 03-01-20 said he was returning our call

## 2020-03-03 NOTE — Addendum Note (Signed)
Addended by: John Giovanni C on: 03/03/2020 01:02 PM   Modules accepted: Orders

## 2020-04-11 ENCOUNTER — Telehealth: Payer: Self-pay

## 2020-04-11 NOTE — Telephone Encounter (Signed)
Called lmom informing patient of appointment on 04/14/2020. klh

## 2020-04-14 ENCOUNTER — Ambulatory Visit: Payer: Medicare Other | Admitting: Adult Health

## 2020-04-14 ENCOUNTER — Telehealth: Payer: Self-pay

## 2020-04-14 NOTE — Telephone Encounter (Signed)
Called lmom informing patient of appointment on 04/15/2020. klh 

## 2020-04-15 ENCOUNTER — Telehealth: Payer: Self-pay

## 2020-04-15 NOTE — Telephone Encounter (Signed)
Called lmom informing patient of appointment on 04/17/2020. klh

## 2020-04-16 ENCOUNTER — Telehealth: Payer: Self-pay

## 2020-04-16 NOTE — Telephone Encounter (Signed)
Confirmed appointment on 04/17/2020 and screened for covid. klh 

## 2020-04-17 ENCOUNTER — Telehealth: Payer: Self-pay

## 2020-04-17 ENCOUNTER — Encounter: Payer: Self-pay | Admitting: Adult Health

## 2020-04-17 ENCOUNTER — Other Ambulatory Visit: Payer: Self-pay

## 2020-04-17 ENCOUNTER — Ambulatory Visit: Payer: Medicare Other | Admitting: Adult Health

## 2020-04-17 ENCOUNTER — Other Ambulatory Visit: Payer: Self-pay | Admitting: Urology

## 2020-04-17 VITALS — BP 129/79 | HR 56 | Temp 97.0°F | Resp 16 | Ht 73.0 in | Wt 223.3 lb

## 2020-04-17 DIAGNOSIS — J449 Chronic obstructive pulmonary disease, unspecified: Secondary | ICD-10-CM

## 2020-04-17 DIAGNOSIS — R972 Elevated prostate specific antigen [PSA]: Secondary | ICD-10-CM | POA: Diagnosis not present

## 2020-04-17 DIAGNOSIS — F17210 Nicotine dependence, cigarettes, uncomplicated: Secondary | ICD-10-CM

## 2020-04-17 DIAGNOSIS — F419 Anxiety disorder, unspecified: Secondary | ICD-10-CM | POA: Diagnosis not present

## 2020-04-17 MED ORDER — SHINGRIX 50 MCG/0.5ML IM SUSR: 0.5000 mL | Freq: Once | INTRAMUSCULAR | 0 refills | Status: AC

## 2020-04-17 NOTE — Telephone Encounter (Signed)
Confirmed appointment on 04/21/2020. klh 

## 2020-04-17 NOTE — Progress Notes (Signed)
[facility] [place], [place] [address]  Internal MEDICINE  Office Visit Note  Patient Name: [name] [name]  [id number]  [id number]  Date of Service: 04/17/2020  Chief Complaint  Patient presents with  . Follow-up  . COPD  . Quality Metric Gaps    tdap, covid-19 vaccine    HPI  Pt is here for follow up on depression, copd, and anxiety. Overall he is doing well.  He feels that he is at baseline.  He recently had his prostate biopsy performed.  He has not heard any results at this time. He continues to see urology as scheduled.  At our last visit we added Wellbutrin due to increased anxiety and depression.  This has done well, and he reports his symptoms are very manageable at this time.         Current Medication: Outpatient Encounter Medications as of 04/17/2020  Medication Sig  . buPROPion (WELLBUTRIN SR) 150 MG 12 hr tablet Take 1 tablet (150 mg total) by mouth 2 (two) times daily.  . busPIRone (BUSPAR) 10 MG tablet Take 1 tablet (10 mg total) by mouth 2 (two) times daily.  . diclofenac Sodium (VOLTAREN) 1 % GEL Apply 4 g topically 4 (four) times daily.  Marland Kitchen ibuprofen (ADVIL) 800 MG tablet Take 800 mg by mouth 2 (two) times daily as needed.  . sildenafil (VIAGRA) 100 MG tablet Take 1 tablet (100 mg total) by mouth daily as needed for erectile dysfunction.  . tamsulosin (FLOMAX) 0.4 MG CAPS capsule Take 1 capsule (0.4 mg total) by mouth daily.   No facility-administered encounter medications on file as of 04/17/2020.    Surgical History: History reviewed. No pertinent surgical history.  Medical History: Past Medical History:  Diagnosis Date  . Arthritis   . COPD (chronic obstructive pulmonary disease) ([place])   . Depression   . Emphysema of lung ([place])     Family History: Family History  Problem Relation Age of Onset  . Alcohol abuse Mother   . Alcohol abuse Father   . Alcohol abuse Brother     Social History   Socioeconomic History  .  Marital status: Single    Spouse name: Not on file  . Number of children: Not on file  . Years of education: Not on file  . Highest education level: Not on file  Occupational History  . Not on file  Tobacco Use  . Smoking status: Current Every Day Smoker    Types: Cigarettes  . Smokeless tobacco: Never Used  . Tobacco comment: 10 a day   Substance and Sexual Activity  . Alcohol use: Not Currently  . Drug use: Never  . Sexual activity: Not on file  Other Topics Concern  . Not on file  Social History Narrative  . Not on file   Social Determinants of Health   Financial Resource Strain:   . Difficulty of Paying Living Expenses:   Food Insecurity:   . Worried About Charity fundraiser in the Last Year:   . Arboriculturist in the Last Year:   Transportation Needs:   . Film/video editor (Medical):   Marland Kitchen Lack of Transportation (Non-Medical):   Physical Activity:   . Days of Exercise per Week:   . Minutes of Exercise per Session:   Stress:   . Feeling of Stress :   Social Connections:   . Frequency of Communication with Friends and Family:   . Frequency of Social Gatherings with  Friends and Family:   . Attends Religious Services:   . Active Member of Clubs or Organizations:   . Attends Archivist Meetings:   Marland Kitchen Marital Status:   Intimate Partner Violence:   . Fear of Current or Ex-Partner:   . Emotionally Abused:   Marland Kitchen Physically Abused:   . Sexually Abused:       Review of Systems  Constitutional: Negative.  Negative for chills, fatigue and unexpected weight change.  HENT: Negative.  Negative for congestion, rhinorrhea, sneezing and sore throat.   Eyes: Negative for redness.  Respiratory: Negative.  Negative for cough, chest tightness and shortness of breath.   Cardiovascular: Negative.  Negative for chest pain and palpitations.  Gastrointestinal: Negative.  Negative for abdominal pain, constipation, diarrhea, nausea and vomiting.  Endocrine: Negative.    Genitourinary: Negative.  Negative for dysuria and frequency.  Musculoskeletal: Negative.  Negative for arthralgias, back pain, joint swelling and neck pain.  Skin: Negative.  Negative for rash.  Allergic/Immunologic: Negative.   Neurological: Negative.  Negative for tremors and numbness.  Hematological: Negative for adenopathy. Does not bruise/bleed easily.  Psychiatric/Behavioral: Negative.  Negative for behavioral problems, sleep disturbance and suicidal ideas. The patient is not nervous/anxious.     Vital Signs: BP (!) 129/79   Pulse 56   Temp (!) 97 F (36.1 C)   Resp 16   Ht 6\' 1"  (1.854 m)   Wt (!) 223 lb 4.8 oz (101.3 kg)   SpO2 99%   BMI 29.46 kg/m    Physical Exam Vitals and nursing note reviewed.  Constitutional:      General: He is not in acute distress.    Appearance: He is well-developed. He is not diaphoretic.  HENT:     Head: Normocephalic and atraumatic.     Mouth/Throat:     Pharynx: No oropharyngeal exudate.  Eyes:     Pupils: Pupils are equal, round, and reactive to light.  Neck:     Thyroid: No thyromegaly.     Vascular: No JVD.     Trachea: No tracheal deviation.  Cardiovascular:     Rate and Rhythm: Normal rate and regular rhythm.     Heart sounds: Normal heart sounds. No murmur heard.  No friction rub. No gallop.   Pulmonary:     Effort: Pulmonary effort is normal. No respiratory distress.     Breath sounds: Normal breath sounds. No wheezing or rales.  Chest:     Chest wall: No tenderness.  Abdominal:     Palpations: Abdomen is soft.     Tenderness: There is no abdominal tenderness. There is no guarding.  Musculoskeletal:        General: Normal range of motion.     Cervical back: Normal range of motion and neck supple.  Lymphadenopathy:     Cervical: No cervical adenopathy.  Skin:    General: Skin is warm and dry.  Neurological:     Mental Status: He is alert and oriented to person, place, and time.     Cranial Nerves: No cranial  nerve deficit.  Psychiatric:        Behavior: Behavior normal.        Thought Content: Thought content normal.        Judgment: Judgment normal.    Assessment/Plan: 1. Chronic obstructive pulmonary disease, unspecified COPD type ([place]) Good relief of symptoms, continue current management.   2. Anxiety Good control of symptoms, continue to follow. Continues current treatment.   3.  Elevated PSA Awaiting results of prostate biopsy. Continue to monitor.   4. Cigarette nicotine dependence without complication Smoking cessation counseling: 1. Pt acknowledges the risks of long term smoking, she will try to quite smoking. 2. Options for different medications including nicotine products, chewing gum, patch etc, Wellbutrin and Chantix is discussed 3. Goal and date of compete cessation is discussed 4. Total time spent in smoking cessation is 15 min.   General Counseling: Paolo verbalizes understanding of the findings of todays visit and agrees with plan of treatment. I have discussed any further diagnostic evaluation that may be needed or ordered today. We also reviewed his medications today. he has been encouraged to call the office with any questions or concerns that should arise related to todays visit.    No orders of the defined types were placed in this encounter.   No orders of the defined types were placed in this encounter.   Time spent: 30 Minutes   This patient was seen by Orson Gear AGNP-C in Collaboration with Dr Lavera Guise as a part of collaborative care agreement     Kendell Bane AGNP-C Internal medicine

## 2020-04-21 ENCOUNTER — Ambulatory Visit: Payer: Medicare Other | Admitting: Hospice and Palliative Medicine

## 2020-04-21 ENCOUNTER — Other Ambulatory Visit: Payer: Self-pay

## 2020-04-21 ENCOUNTER — Encounter: Payer: Self-pay | Admitting: Internal Medicine

## 2020-04-21 VITALS — BP 111/71 | HR 58 | Temp 97.5°F | Resp 16 | Ht 73.0 in | Wt 224.0 lb

## 2020-04-21 DIAGNOSIS — H6123 Impacted cerumen, bilateral: Secondary | ICD-10-CM

## 2020-04-21 DIAGNOSIS — F419 Anxiety disorder, unspecified: Secondary | ICD-10-CM | POA: Diagnosis not present

## 2020-04-21 NOTE — Progress Notes (Signed)
[facility] [place], [place] [address]  Internal MEDICINE  Office Visit Note  Patient Name: [name] [name]  [id number]  [id number]  Date of Service: 04/28/2020  Chief Complaint  Patient presents with  . Acute Visit    ear impaction  . Quality Metric Gaps    hep C     HPI Pt is here for a sick visit.  Complaints of bilateral ear fullness and hearing loss. Recently went to be seen for potential hearing aids as he felt as though his hearing had diminished. At appointment for hearing aids, was told he needed to be seen by PCP to have ears cleaned out as they found cerumen impaction. No other complaints at this time.   Current Medication:  Outpatient Encounter Medications as of 04/21/2020  Medication Sig  . buPROPion (WELLBUTRIN SR) 150 MG 12 hr tablet Take 1 tablet (150 mg total) by mouth 2 (two) times daily.  . busPIRone (BUSPAR) 10 MG tablet Take 1 tablet (10 mg total) by mouth 2 (two) times daily.  . diclofenac Sodium (VOLTAREN) 1 % GEL Apply 4 g topically 4 (four) times daily.  Marland Kitchen ibuprofen (ADVIL) 800 MG tablet Take 800 mg by mouth 2 (two) times daily as needed.  . sildenafil (VIAGRA) 100 MG tablet Take 1 tablet (100 mg total) by mouth daily as needed for erectile dysfunction.  . tamsulosin (FLOMAX) 0.4 MG CAPS capsule Take 1 capsule (0.4 mg total) by mouth daily.   No facility-administered encounter medications on file as of 04/21/2020.    Medical History: Past Medical History:  Diagnosis Date  . Arthritis   . COPD (chronic obstructive pulmonary disease) ([place])   . Depression   . Emphysema of lung ([place])    Review of Systems  Constitutional: Negative.   HENT: Negative.   Eyes: Negative.   Gastrointestinal: Negative.   Genitourinary: Negative.   Musculoskeletal: Negative.     Vital Signs: BP 111/71   Pulse (!) 58   Temp (!) 97.5 F (36.4 C)   Resp 16   Ht 6\' 1"  (1.854 m)   Wt 224 lb (101.6 kg)   SpO2 95%   BMI 29.55 kg/m       Physical Exam Constitutional:      Appearance: Normal appearance.  HENT:     Right Ear: Decreased hearing noted. There is impacted cerumen.     Left Ear: Decreased hearing noted. There is impacted cerumen.     Mouth/Throat:     Mouth: Mucous membranes are moist.     Pharynx: Oropharynx is clear.  Cardiovascular:     Rate and Rhythm: Normal rate and regular rhythm.     Pulses: Normal pulses.  Pulmonary:     Effort: Pulmonary effort is normal.     Breath sounds: Normal breath sounds.  Abdominal:     General: Abdomen is flat. Bowel sounds are normal.  Musculoskeletal:        General: Normal range of motion.  Skin:    General: Skin is warm and dry.  Neurological:     General: No focal deficit present.     Mental Status: He is alert and oriented to person, place, and time. Mental status is at baseline.  Psychiatric:        Mood and Affect: Mood normal.        Thought Content: Thought content normal.    Assessment/Plan: 1. Bilateral impacted cerumen Cerumen removed from bilateral ears by lavage. Post lavage, TM visualized and  hearing returned to baseline. Discussed appropriate interventions in keeping ears clean. Avoid Q-tips, may use cerumen softener as needed to avoid impaction. - Ear Lavage  2. Anxiety Stable at this time, continue with current therapy.  General Counseling: [name] [name] verbalizes understanding of the findings of todays visit and agrees with plan of treatment. I have discussed any further diagnostic evaluation that may be needed or ordered today. We also reviewed his medications today. he has been encouraged to call the office with any questions or concerns that should arise related to todays visit.   Orders Placed This Encounter  Procedures  . Ear Lavage     Time spent:35  Minutes  This patient was seen by [name] [name], AGNP-C in collaboration with Dr. Lavera [name] as part of a collaborative care agreement.  [name] [name] [name] [name] Internal  Medicine

## 2020-04-28 ENCOUNTER — Telehealth: Payer: Self-pay

## 2020-04-28 NOTE — Telephone Encounter (Signed)
Patient left a vmail returning your call.

## 2020-04-30 NOTE — Telephone Encounter (Signed)
Patient left a vmail returning your call for results

## 2020-05-01 ENCOUNTER — Ambulatory Visit (INDEPENDENT_AMBULATORY_CARE_PROVIDER_SITE_OTHER): Payer: Medicare Other | Admitting: Urology

## 2020-05-01 ENCOUNTER — Other Ambulatory Visit: Payer: Self-pay

## 2020-05-01 ENCOUNTER — Encounter: Payer: Self-pay | Admitting: Urology

## 2020-05-01 VITALS — BP 109/71 | HR 69 | Ht 73.0 in | Wt 224.0 lb

## 2020-05-01 DIAGNOSIS — C61 Malignant neoplasm of prostate: Secondary | ICD-10-CM | POA: Diagnosis not present

## 2020-05-02 ENCOUNTER — Encounter: Payer: Self-pay | Admitting: Urology

## 2020-05-02 NOTE — Progress Notes (Signed)
05/01/2020 7:31 AM   [name] [name] 1956-05-24 [id number]  Referring provider: Kendell Bane, NP [place],  [place] [address]  Chief Complaint  Patient presents with  . Follow-up    HPI: 64 y.o. male presents for prostate biopsy follow-up.   Initially seen 12/2019 elevated PSA 7.5 drawn 11/02/2019  Repeat PSA remains elevated 6.6  Prostate MRI 02/2020 39 cc gland  PI-RADS 4 lesion right medial apex with suspicion mild extracapsular extension  Fusion biopsy [facility] 04/08/2020  No post biopsy complaints  55 cc gland  4 ROI biopsies with 12 standard biopsies  ROI lesion with Gleason 3+3 adenocarcinoma involving 5% of core  Foci atypical glands and HGPIN in 2 additional cores  12 core biopsy with foci atypia and HGPIN 4/6 cores on right  PMH: Past Medical History:  Diagnosis Date  . Arthritis   . COPD (chronic obstructive pulmonary disease) ([place])   . Depression   . Emphysema of lung [facility])     Surgical History: No past surgical history on file.  Home Medications:  Allergies as of 05/01/2020   No Known Allergies     Medication List       Accurate as of May 01, 2020 11:59 PM. If you have any questions, ask your nurse or doctor.        buPROPion 150 MG 12 hr tablet Commonly known as: Wellbutrin SR Take 1 tablet (150 mg total) by mouth 2 (two) times daily.   busPIRone 10 MG tablet Commonly known as: BUSPAR Take 1 tablet (10 mg total) by mouth 2 (two) times daily.   diazepam 10 MG tablet Commonly known as: VALIUM Take 10 mg by mouth daily.   diclofenac Sodium 1 % Gel Commonly known as: Voltaren Apply 4 g topically 4 (four) times daily.   ibuprofen 800 MG tablet Commonly known as: ADVIL Take 800 mg by mouth 2 (two) times daily as needed.   sildenafil 100 MG tablet Commonly known as: VIAGRA Take 1 tablet (100 mg total) by mouth daily as needed for erectile dysfunction.   tamsulosin 0.4 MG Caps capsule Commonly known  as: FLOMAX Take 1 capsule (0.4 mg total) by mouth daily.       Allergies: No Known Allergies  Family History: Family History  Problem Relation Age of Onset  . Alcohol abuse Mother   . Alcohol abuse Father   . Alcohol abuse Brother     Social History:  reports that he has been smoking cigarettes. He has never used smokeless tobacco. He reports previous alcohol use. He reports that he does not use drugs.   Physical Exam: BP 109/71   Pulse 69   Ht 6\' 1"  (1.854 m)   Wt 224 lb (101.6 kg)   SpO2 93%   BMI 29.55 kg/m   Constitutional:  Alert and oriented, No acute distress. HEENT: [place] AT, moist mucus membranes.  Trachea midline, no masses. Cardiovascular: No clubbing, cyanosis, or edema. Respiratory: Normal respiratory effort, no increased work of breathing.   Assessment & Plan:    1.  Clinical T1c prostate cancer  The pathology report was discussed in detail with [name] [name] and his wife  Although ROI biopsy showed 5% Gleason 3+3 adenocarcinoma there is a suggestion of extracapsular extension on MRI  We discussed the most common curative treatment options of radical prostatectomy and radiation modalities including IMRT and brachytherapy  Active surveillance was also discussed however MRI suggest extracapsular extension  I recommended a second opinion at  the [facility] multidisciplinary oncology clinic which they are interested in pursuing   Abbie Sons, [place] [address] [place], [place] [address] [phone]

## 2020-05-02 NOTE — Telephone Encounter (Signed)
Had office visit 05/01/2020

## 2020-06-10 ENCOUNTER — Other Ambulatory Visit: Payer: Self-pay | Admitting: Urology

## 2020-06-10 DIAGNOSIS — C61 Malignant neoplasm of prostate: Secondary | ICD-10-CM

## 2020-08-26 ENCOUNTER — Encounter: Payer: Self-pay | Admitting: Hospice and Palliative Medicine

## 2020-08-26 ENCOUNTER — Other Ambulatory Visit: Payer: Self-pay

## 2020-08-26 ENCOUNTER — Ambulatory Visit (INDEPENDENT_AMBULATORY_CARE_PROVIDER_SITE_OTHER): Payer: Medicare Other | Admitting: Hospice and Palliative Medicine

## 2020-08-26 VITALS — BP 110/72 | HR 62 | Temp 97.0°F | Resp 16 | Ht 73.0 in | Wt 224.0 lb

## 2020-08-26 DIAGNOSIS — M1712 Unilateral primary osteoarthritis, left knee: Secondary | ICD-10-CM | POA: Diagnosis not present

## 2020-08-26 DIAGNOSIS — M109 Gout, unspecified: Secondary | ICD-10-CM | POA: Diagnosis not present

## 2020-08-26 MED ORDER — COLCHICINE 0.6 MG PO TABS: 0.6000 mg | ORAL_TABLET | Freq: Two times a day (BID) | ORAL | 0 refills | Status: DC

## 2020-08-26 NOTE — Progress Notes (Signed)
[facility] [place], [place] [address]  Internal MEDICINE  Office Visit Note  Patient Name: [name] [name]  [id number]  [id number]  Date of Service: 08/29/2020  Chief Complaint  Patient presents with  . Acute Visit    left cafe pt has gout  . Gastroesophageal Reflux  . Depression  . COPD  . policy update form    received  . Quality Metric Gaps    PNA     HPI Pt is here for a sick visit. He was seen by orthopaedics and received left knee cortisone injection yesterday, they also obtained an xray of his left knee and advised him he has gout in his joint and to contact PCP for treatment He has never had gout in the past Pain in his knee radiates to the back of his left leg and down into his calf muscle, painful to the touch, no recent injury Pain has not improved since cortisone injection which prompted him to come into today to get treatment for his gout  Current Medication:  Outpatient Encounter Medications as of 08/26/2020  Medication Sig  . buPROPion (WELLBUTRIN SR) 150 MG 12 hr tablet Take 1 tablet (150 mg total) by mouth 2 (two) times daily.  . busPIRone (BUSPAR) 10 MG tablet Take 1 tablet (10 mg total) by mouth 2 (two) times daily.  . colchicine 0.6 MG tablet Take 1 tablet (0.6 mg total) by mouth 2 (two) times daily.  . diazepam (VALIUM) 10 MG tablet Take 10 mg by mouth daily.  . diclofenac Sodium (VOLTAREN) 1 % GEL Apply 4 g topically 4 (four) times daily.  Marland Kitchen ibuprofen (ADVIL) 800 MG tablet Take 800 mg by mouth 2 (two) times daily as needed.  . sildenafil (VIAGRA) 100 MG tablet Take 1 tablet (100 mg total) by mouth daily as needed for erectile dysfunction.  . tamsulosin (FLOMAX) 0.4 MG CAPS capsule Take 1 capsule (0.4 mg total) by mouth daily.   No facility-administered encounter medications on file as of 08/26/2020.      Medical History: Past Medical History:  Diagnosis Date  . Arthritis   . COPD (chronic obstructive pulmonary  disease) ([place])   . Depression   . Emphysema of lung ([facility])      Vital Signs: BP 110/72   Pulse 62   Temp (!) 97 F (36.1 C)   Resp 16   Ht 6\' 1"  (1.854 m)   Wt 224 lb (101.6 kg)   SpO2 96%   BMI 29.55 kg/m    Review of Systems  Constitutional: Negative for chills, fatigue and unexpected weight change.  HENT: Negative for congestion, postnasal drip, rhinorrhea, sneezing and sore throat.   Eyes: Negative for redness.  Respiratory: Negative for cough, chest tightness and shortness of breath.   Cardiovascular: Negative for chest pain and palpitations.  Gastrointestinal: Negative for abdominal pain, constipation, diarrhea, nausea and vomiting.  Genitourinary: Negative for dysuria and frequency.  Musculoskeletal: Positive for arthralgias. Negative for back pain, joint swelling and neck pain.       Left knee pain, radiates to left calf muscle  Skin: Negative for rash.  Neurological: Negative for tremors and numbness.  Hematological: Negative for adenopathy. Does not bruise/bleed easily.  Psychiatric/Behavioral: Negative for behavioral problems (Depression), sleep disturbance and suicidal ideas. The patient is not nervous/anxious.     Physical Exam Vitals reviewed.  Constitutional:      Appearance: Normal appearance. He is normal weight.  Cardiovascular:     Rate and  Rhythm: Normal rate and regular rhythm.     Pulses: Normal pulses.     Heart sounds: Normal heart sounds.  Pulmonary:     Effort: Pulmonary effort is normal.     Breath sounds: Normal breath sounds.  Musculoskeletal:        General: Tenderness present. Normal range of motion.     Comments: Left knee tender to touch  Skin:    General: Skin is warm.  Neurological:     General: No focal deficit present.     Mental Status: He is alert and oriented to person, place, and time. Mental status is at baseline.  Psychiatric:        Mood and Affect: Mood normal.        Behavior: Behavior normal.        Thought  Content: Thought content normal.    Assessment/Plan: 1. Acute gout of left knee, unspecified cause Start colchicine for acute gout flare Will reassess pain on Friday--may consider switching to preventative therapy at that time if pain improved, it not may consider uric acid level monitoring - colchicine 0.6 MG tablet; Take 1 tablet (0.6 mg total) by mouth 2 (two) times daily.  Dispense: 30 tablet; Refill: 0  2. Arthritis of left knee Continue with orthopaedic plan and routine cortisone injections for symptom improvement  General Counseling: Cliff verbalizes understanding of the findings of todays visit and agrees with plan of treatment. I have discussed any further diagnostic evaluation that may be needed or ordered today. We also reviewed his medications today. he has been encouraged to call the office with any questions or concerns that should arise related to todays visit.     Meds ordered this encounter  Medications  . colchicine 0.6 MG tablet    Sig: Take 1 tablet (0.6 mg total) by mouth 2 (two) times daily.    Dispense:  30 tablet    Refill:  0    Time spent: 30 Minutes Time spent includes review of chart, medications, test results and follow-up plan with the patient. This patient was seen by Theodoro Grist AGNP-C in Collaboration with Dr Lavera Guise as a part of collaborative care agreement.  Tanna Furry [facility] Internal Medicine

## 2020-08-29 ENCOUNTER — Other Ambulatory Visit: Payer: Self-pay | Admitting: Hospice and Palliative Medicine

## 2020-08-29 ENCOUNTER — Encounter: Payer: Self-pay | Admitting: Hospice and Palliative Medicine

## 2020-08-29 ENCOUNTER — Telehealth: Payer: Self-pay

## 2020-08-29 DIAGNOSIS — M109 Gout, unspecified: Secondary | ICD-10-CM

## 2020-08-29 NOTE — Patient Instructions (Signed)
 Gout  Gout is painful swelling of your joints. Gout is a type of arthritis. It is caused by having too much uric acid in your body. Uric acid is a chemical that is made when your body breaks down substances called purines. If your body has too much uric acid, sharp crystals can form and build up in your joints. This causes pain and swelling. Gout attacks can happen quickly and be very painful (acute gout). Over time, the attacks can affect more joints and happen more often (chronic gout). What are the causes?  Too much uric acid in your blood. This can happen because: ? Your kidneys do not remove enough uric acid from your blood. ? Your body makes too much uric acid. ? You eat too many foods that are high in purines. These foods include organ meats, some seafood, and beer.  Trauma or stress. What increases the risk?  Having a family history of gout.  Being male and middle-aged.  Being male and having gone through menopause.  Being very overweight (obese).  Drinking alcohol, especially beer.  Not having enough water in the body (being dehydrated).  Losing weight too quickly.  Having an organ transplant.  Having lead poisoning.  Taking certain medicines.  Having kidney disease.  Having a skin condition called psoriasis. What are the signs or symptoms? An attack of acute gout usually happens in just one joint. The most common place is the big toe. Attacks often start at night. Other joints that may be affected include joints of the feet, ankle, knee, fingers, wrist, or elbow. Symptoms of an attack may include:  Very bad pain.  Warmth.  Swelling.  Stiffness.  Shiny, red, or purple skin.  Tenderness. The affected joint may be very painful to touch.  Chills and fever. Chronic gout may cause symptoms more often. More joints may be involved. You may also have white or yellow lumps (tophi) on your hands or feet or in other areas near your joints. How is this  treated?  Treatment for this condition has two phases: treating an acute attack and preventing future attacks.  Acute gout treatment may include: ? NSAIDs. ? Steroids. These are taken by mouth or injected into a joint. ? Colchicine. This medicine relieves pain and swelling. It can be given by mouth or through an IV tube.  Preventive treatment may include: ? Taking small doses of NSAIDs or colchicine daily. ? Using a medicine that reduces uric acid levels in your blood. ? Making changes to your diet. You may need to see a food expert (dietitian) about what to eat and drink to prevent gout. Follow these instructions at home: During a gout attack   If told, put ice on the painful area: ? Put ice in a plastic bag. ? Place a towel between your skin and the bag. ? Leave the ice on for 20 minutes, 2-3 times a day.  Raise (elevate) the painful joint above the level of your heart as often as you can.  Rest the joint as much as possible. If the joint is in your leg, you may be given crutches.  Follow instructions from your doctor about what you cannot eat or drink. Avoiding future gout attacks  Eat a low-purine diet. Avoid foods and drinks such as: ? Liver. ? Kidney. ? Anchovies. ? Asparagus. ? Herring. ? Mushrooms. ? Mussels. ? Beer.  Stay at a healthy weight. If you want to lose weight, talk with your doctor. Do not lose   weight too fast.  Start or continue an exercise plan as told by your doctor. Eating and drinking  Drink enough fluids to keep your pee (urine) pale yellow.  If you drink alcohol: ? Limit how much you use to:  0-1 drink a day for women.  0-2 drinks a day for men. ? Be aware of how much alcohol is in your drink. In the [place] one drink equals one 12 oz bottle of beer (355 mL), one 5 oz glass of wine (148 mL), or one 1 oz glass of hard liquor (44 mL). General instructions  Take over-the-counter and prescription medicines only as told by your doctor.  Do  not drive or use heavy machinery while taking prescription pain medicine.  Return to your normal activities as told by your doctor. Ask your doctor what activities are safe for you.  Keep all follow-up visits as told by your doctor. This is important. Contact a doctor if:  You have another gout attack.  You still have symptoms of a gout attack after 10 days of treatment.  You have problems (side effects) because of your medicines.  You have chills or a fever.  You have burning pain when you pee (urinate).  You have pain in your lower back or belly. Get help right away if:  You have very bad pain.  Your pain cannot be controlled.  You cannot pee. Summary  Gout is painful swelling of the joints.  The most common site of pain is the big toe, but it can affect other joints.  Medicines and avoiding some foods can help to prevent and treat gout attacks. This information is not intended to replace advice given to you by your health care provider. Make sure you discuss any questions you have with your health care provider. Document Revised: 03/29/2018 Document Reviewed: 03/29/2018 Elsevier Patient Education  2020 Elsevier Inc.  

## 2020-09-02 ENCOUNTER — Other Ambulatory Visit: Payer: Self-pay | Admitting: Hospice and Palliative Medicine

## 2020-09-02 LAB — URIC ACID: Uric Acid: 6.1 mg/dL (ref 3.8–8.4)

## 2020-09-02 MED ORDER — ALLOPURINOL 100 MG PO TABS: 100.0000 mg | ORAL_TABLET | Freq: Every day | ORAL | 1 refills | Status: DC

## 2020-09-02 NOTE — Progress Notes (Signed)
Please call patient and let him know that his uric acid level has normalized, he can stop colchicine at this time and we will start allopurinol to avoid future gout attacks. Do we have a diet plan print out for gout patients? If not, come to me and I will find one for you to send him in the mail. Thanks!

## 2020-09-04 ENCOUNTER — Telehealth: Payer: Self-pay

## 2020-09-04 NOTE — Telephone Encounter (Signed)
Mailed gout diet

## 2020-09-05 ENCOUNTER — Telehealth: Payer: Self-pay

## 2020-09-05 ENCOUNTER — Other Ambulatory Visit: Payer: Self-pay

## 2020-09-05 ENCOUNTER — Other Ambulatory Visit: Payer: Self-pay | Admitting: Hospice and Palliative Medicine

## 2020-09-05 MED ORDER — INDOMETHACIN 50 MG PO CAPS: 50.0000 mg | ORAL_CAPSULE | Freq: Two times a day (BID) | ORAL | 0 refills | Status: DC

## 2020-09-05 NOTE — Telephone Encounter (Signed)
ADVISED PT THAT WE SEND PRES TO [facility] DON'T TAKE IBUPROFEN WITH INDOMETHACIN AND ADVISED TO USED [facility]

## 2020-09-05 NOTE — Progress Notes (Signed)
Stop allopurinol start indomethacin as pain from gout has returned. Advised to take 7 days of Indomethacin and contact office to discuss symptoms at that time.

## 2020-09-11 NOTE — Telephone Encounter (Signed)
Done

## 2020-09-29 ENCOUNTER — Other Ambulatory Visit: Payer: Self-pay | Admitting: Hospice and Palliative Medicine

## 2020-09-29 MED ORDER — INDOMETHACIN 50 MG PO CAPS: 50.0000 mg | ORAL_CAPSULE | Freq: Two times a day (BID) | ORAL | 0 refills | Status: DC

## 2020-10-31 ENCOUNTER — Other Ambulatory Visit: Payer: Self-pay | Admitting: Hospice and Palliative Medicine

## 2020-10-31 ENCOUNTER — Telehealth: Payer: Self-pay

## 2020-10-31 DIAGNOSIS — R5383 Other fatigue: Secondary | ICD-10-CM

## 2020-10-31 DIAGNOSIS — M109 Gout, unspecified: Secondary | ICD-10-CM | POA: Diagnosis not present

## 2020-10-31 DIAGNOSIS — Z125 Encounter for screening for malignant neoplasm of prostate: Secondary | ICD-10-CM

## 2020-10-31 NOTE — Telephone Encounter (Signed)
Called Pt advise Pt as per Lovena Le labs sent in and Pt needs to go have them done. Discontinued indomethacin and will see what medication is appropriate after labs are done.

## 2020-11-01 LAB — CBC WITH DIFFERENTIAL/PLATELET
Basophils Absolute: 0.1 10*3/uL (ref 0.0–0.2)
Basos: 1 %
EOS (ABSOLUTE): 0.2 10*3/uL (ref 0.0–0.4)
Eos: 3 %
Hematocrit: 43.5 % (ref 37.5–51.0)
Hemoglobin: 14.6 g/dL (ref 13.0–17.7)
Immature Grans (Abs): 0 10*3/uL (ref 0.0–0.1)
Immature Granulocytes: 0 %
Lymphocytes Absolute: 1.7 10*3/uL (ref 0.7–3.1)
Lymphs: 24 %
MCH: 28.6 pg (ref 26.6–33.0)
MCHC: 33.6 g/dL (ref 31.5–35.7)
MCV: 85 fL (ref 79–97)
Monocytes Absolute: 0.6 10*3/uL (ref 0.1–0.9)
Monocytes: 8 %
Neutrophils Absolute: 4.6 10*3/uL (ref 1.4–7.0)
Neutrophils: 64 %
Platelets: 185 10*3/uL (ref 150–450)
RBC: 5.1 x10E6/uL (ref 4.14–5.80)
RDW: 12.7 % (ref 11.6–15.4)
WBC: 7 10*3/uL (ref 3.4–10.8)

## 2020-11-01 LAB — LIPID PANEL WITH LDL/HDL RATIO
Cholesterol, Total: 181 mg/dL (ref 100–199)
HDL: 33 mg/dL — ABNORMAL LOW (ref >39)
LDL Chol Calc (NIH): 114 mg/dL — ABNORMAL HIGH (ref 0–99)
LDL/HDL Ratio: 3.5 ratio (ref 0.0–3.6)
Triglycerides: 194 mg/dL — ABNORMAL HIGH (ref 0–149)
VLDL Cholesterol Cal: 34 mg/dL (ref 5–40)

## 2020-11-01 LAB — COMPREHENSIVE METABOLIC PANEL
ALT: 24 IU/L (ref 0–44)
AST: 22 IU/L (ref 0–40)
Albumin/Globulin Ratio: 1.8 (ref 1.2–2.2)
Albumin: 4.4 g/dL (ref 3.8–4.8)
Alkaline Phosphatase: 76 IU/L (ref 44–121)
BUN/Creatinine Ratio: 12 (ref 10–24)
BUN: 14 mg/dL (ref 8–27)
Bilirubin Total: 0.3 mg/dL (ref 0.0–1.2)
CO2: 20 mmol/L (ref 20–29)
Calcium: 9 mg/dL (ref 8.6–10.2)
Chloride: 105 mmol/L (ref 96–106)
Creatinine, Ser: 1.15 mg/dL (ref 0.76–1.27)
GFR calc Af Amer: 76 mL/min/{1.73_m2} (ref >59)
GFR calc non Af Amer: 65 mL/min/{1.73_m2} (ref >59)
Globulin, Total: 2.4 g/dL (ref 1.5–4.5)
Glucose: 117 mg/dL — ABNORMAL HIGH (ref 65–99)
Potassium: 4.4 mmol/L (ref 3.5–5.2)
Sodium: 142 mmol/L (ref 134–144)
Total Protein: 6.8 g/dL (ref 6.0–8.5)

## 2020-11-01 LAB — FPSA% REFLEX
% FREE PSA: 18.9 %
PSA, FREE: 1.72 ng/mL

## 2020-11-01 LAB — TSH+FREE T4
Free T4: 1.22 ng/dL (ref 0.82–1.77)
TSH: 2.61 u[IU]/mL (ref 0.450–4.500)

## 2020-11-01 LAB — PSA TOTAL (REFLEX TO FREE): Prostate Specific Ag, Serum: 9.1 ng/mL — ABNORMAL HIGH (ref 0.0–4.0)

## 2020-11-01 LAB — URIC ACID: Uric Acid: 6.7 mg/dL (ref 3.8–8.4)

## 2020-11-03 NOTE — Progress Notes (Signed)
Labs reviewed, will be discussed at upcoming visit. PSA--followed by urology, will discuss next steps.

## 2020-11-07 ENCOUNTER — Other Ambulatory Visit: Payer: Self-pay

## 2020-11-07 ENCOUNTER — Other Ambulatory Visit: Payer: Self-pay | Admitting: Hospice and Palliative Medicine

## 2020-11-07 MED ORDER — ALLOPURINOL 100 MG PO TABS: 100.0000 mg | ORAL_TABLET | Freq: Every day | ORAL | 6 refills | Status: DC

## 2020-11-07 MED ORDER — MELOXICAM 7.5 MG PO TABS: 7.5000 mg | ORAL_TABLET | Freq: Every day | ORAL | 0 refills | Status: DC

## 2020-11-07 MED ORDER — ALLOPURINOL 100 MG PO TABS: ORAL_TABLET | ORAL | 3 refills | Status: DC

## 2020-11-07 NOTE — Telephone Encounter (Signed)
lmom that we send increase allopurinol 200 mg daily and add meloxicam once daily as per taylor

## 2020-11-13 ENCOUNTER — Telehealth: Payer: Self-pay

## 2020-11-13 NOTE — Telephone Encounter (Signed)
Left patient and asked if he could come at later appointment on 11/26/20.beth

## 2020-11-18 ENCOUNTER — Telehealth: Payer: Self-pay

## 2020-11-18 ENCOUNTER — Encounter: Payer: Self-pay | Admitting: Hospice and Palliative Medicine

## 2020-11-18 ENCOUNTER — Other Ambulatory Visit: Payer: Self-pay

## 2020-11-18 ENCOUNTER — Ambulatory Visit: Payer: Medicare Other | Admitting: Hospice and Palliative Medicine

## 2020-11-18 VITALS — Temp 97.0°F | Ht 72.0 in | Wt 216.0 lb

## 2020-11-18 DIAGNOSIS — R112 Nausea with vomiting, unspecified: Secondary | ICD-10-CM | POA: Diagnosis not present

## 2020-11-18 DIAGNOSIS — R1033 Periumbilical pain: Secondary | ICD-10-CM

## 2020-11-18 MED ORDER — ONDANSETRON 4 MG PO TBDP: 4.0000 mg | ORAL_TABLET | Freq: Three times a day (TID) | ORAL | 0 refills | Status: DC | PRN

## 2020-11-18 NOTE — Telephone Encounter (Signed)
Called patient and got him scheduled for virtual visit. 

## 2020-11-18 NOTE — Progress Notes (Signed)
[facility] [place],  [address]  Internal MEDICINE  Telephone Visit  Patient Name: [name] [name]  [id number]  [id number]  Date of Service: 11/19/2020  I connected with the patient at 1337 by telephone and verified the patients identity using two identifiers.   I discussed the limitations, risks, security and privacy concerns of performing an evaluation and management service by telephone and the availability of in person appointments. I also discussed with the patient that there may be a patient responsible charge related to the service.  The patient expressed understanding and agrees to proceed.    Chief Complaint  Patient presents with  . Telephone Assessment    Video [phone]  . Telephone Screen    Covid test is negative  . Diarrhea    Start Saturday with fever temp101.1  . Vomiting  . Nausea    HPI Patient being seen virtually today for acute sick visit Complaining of abdominal pain, diarrhea and vomiting-symptoms initially started over the weekend and have been ongoing since Unable to keep solid or liquids down, has been increasing his fluid intake to avoid dehydration Had him palpate his abdomen and pain response noted at periumbilical area Denies any blood in stool or emesis  Had a fever over the weekend but has remain afebrile since Saturday  Current Medication: Outpatient Encounter Medications as of 11/18/2020  Medication Sig  . ondansetron (ZOFRAN ODT) 4 MG disintegrating tablet Take 1 tablet (4 mg total) by mouth every 8 (eight) hours as needed for nausea or vomiting.  Marland Kitchen allopurinol (ZYLOPRIM) 100 MG tablet Take by mouth. Take 2 tab po daily  . allopurinol (ZYLOPRIM) 100 MG tablet Take 2 tab po daily  . buPROPion (WELLBUTRIN SR) 150 MG 12 hr tablet Take 1 tablet (150 mg total) by mouth 2 (two) times daily.  . busPIRone (BUSPAR) 10 MG tablet Take 1 tablet (10 mg total) by mouth 2 (two) times daily.  . diazepam (VALIUM) 10 MG  tablet Take 10 mg by mouth daily.  . diclofenac Sodium (VOLTAREN) 1 % GEL Apply 4 g topically 4 (four) times daily.  Marland Kitchen ibuprofen (ADVIL) 800 MG tablet Take 800 mg by mouth 2 (two) times daily as needed.  . meloxicam (MOBIC) 7.5 MG tablet Take 1 tablet (7.5 mg total) by mouth daily.  . sildenafil (VIAGRA) 100 MG tablet Take 1 tablet (100 mg total) by mouth daily as needed for erectile dysfunction.  . tamsulosin (FLOMAX) 0.4 MG CAPS capsule Take 1 capsule (0.4 mg total) by mouth daily.   No facility-administered encounter medications on file as of 11/18/2020.    Surgical History: No past surgical history on file.  Medical History: Past Medical History:  Diagnosis Date  . Arthritis   . COPD (chronic obstructive pulmonary disease) ([place])   . Depression   . Emphysema of lung ([place])     Family History: Family History  Problem Relation Age of Onset  . Alcohol abuse Mother   . Alcohol abuse Father   . Alcohol abuse Brother     Social History   Socioeconomic History  . Marital status: Single    Spouse name: Not on file  . Number of children: Not on file  . Years of education: Not on file  . Highest education level: Not on file  Occupational History  . Not on file  Tobacco Use  . Smoking status: Current Every Day Smoker    Types: Cigarettes  . Smokeless tobacco: Never Used  . Tobacco  comment: 10 a day   Substance and Sexual Activity  . Alcohol use: Not Currently  . Drug use: Never  . Sexual activity: Not on file  Other Topics Concern  . Not on file  Social History Narrative  . Not on file   Social Determinants of Health   Financial Resource Strain: Not on file  Food Insecurity: Not on file  Transportation Needs: Not on file  Physical Activity: Not on file  Stress: Not on file  Social Connections: Not on file  Intimate Partner Violence: Not on file   Review of Systems  Constitutional: Positive for fatigue. Negative for chills and unexpected weight change.  HENT:  Negative for congestion, postnasal drip, rhinorrhea, sneezing and sore throat.   Eyes: Negative for redness.  Respiratory: Negative for cough, chest tightness and shortness of breath.   Cardiovascular: Negative for chest pain and palpitations.  Gastrointestinal: Positive for abdominal pain, diarrhea, nausea and vomiting. Negative for constipation.  Genitourinary: Negative for dysuria and frequency.  Musculoskeletal: Negative for arthralgias, back pain, joint swelling and neck pain.  Skin: Negative for rash.  Neurological: Negative for tremors and numbness.  Hematological: Negative for adenopathy. Does not bruise/bleed easily.  Psychiatric/Behavioral: Negative for behavioral problems (Depression), sleep disturbance and suicidal ideas. The patient is not nervous/anxious.     Vital Signs: Temp (!) 97 F (36.1 C)   Ht 6' (1.829 m)   Wt 216 lb (98 kg)   BMI 29.29 kg/m    Observation/Objective: Alert and oriented, answers questions appropriately. Noted pain response at periumbilical area of abdomen upon self palpation.   Assessment/Plan: 1. Periumbilical abdominal pain Will review US//possible acute pancreatitis/cholecystitis - US Abdomen Complete; Future  2. Non-intractable vomiting with nausea, unspecified vomiting type Zofran as needed for ongoing N/V Encouraged to continue with increased fluid intake - ondansetron (ZOFRAN ODT) 4 MG disintegrating tablet; Take 1 tablet (4 mg total) by mouth every 8 (eight) hours as needed for nausea or vomiting.  Dispense: 20 tablet; Refill: 0 - US Abdomen Complete; Future  General Counseling: Maleko verbalizes understanding of the findings of today's phone visit and agrees with plan of treatment. I have discussed any further diagnostic evaluation that may be needed or ordered today. We also reviewed his medications today. he has been encouraged to call the office with any questions or concerns that should arise related to todays visit.    Orders  Placed This Encounter  Procedures  . US Abdomen Complete    Meds ordered this encounter  Medications  . ondansetron (ZOFRAN ODT) 4 MG disintegrating tablet    Sig: Take 1 tablet (4 mg total) by mouth every 8 (eight) hours as needed for nausea or vomiting.    Dispense:  20 tablet    Refill:  0     Time spent: 30 Minutes Time spent includes review of chart, medications, test results and follow-up plan with the patient.  Tanna Furry Amberly Livas AGNP-C Internal medicine

## 2020-11-18 NOTE — Telephone Encounter (Signed)
Can u call him,

## 2020-11-19 ENCOUNTER — Other Ambulatory Visit: Payer: Self-pay

## 2020-11-19 ENCOUNTER — Ambulatory Visit
Admission: RE | Admit: 2020-11-19 | Discharge: 2020-11-19 | Disposition: A | Discharge status: Home / Self Care | Payer: Medicare Other | Source: Ambulatory Visit | Attending: Hospice and Palliative Medicine | Admitting: Hospice and Palliative Medicine

## 2020-11-19 ENCOUNTER — Encounter: Payer: Self-pay | Admitting: Hospice and Palliative Medicine

## 2020-11-19 DIAGNOSIS — R112 Nausea with vomiting, unspecified: Secondary | ICD-10-CM | POA: Diagnosis not present

## 2020-11-19 DIAGNOSIS — K7689 Other specified diseases of liver: Secondary | ICD-10-CM | POA: Diagnosis not present

## 2020-11-19 DIAGNOSIS — R1033 Periumbilical pain: Secondary | ICD-10-CM | POA: Insufficient documentation

## 2020-11-19 DIAGNOSIS — K760 Fatty (change of) liver, not elsewhere classified: Secondary | ICD-10-CM | POA: Diagnosis not present

## 2020-11-19 DIAGNOSIS — N281 Cyst of kidney, acquired: Secondary | ICD-10-CM | POA: Diagnosis not present

## 2020-11-19 NOTE — Progress Notes (Signed)
Please let him know [place] of abdomen does not show acute abnormalities. Evidence of fatty liver unlikely causing his symptoms. He is scheduled to be seen in office next week. If symptoms are still present may need to send to GI.

## 2020-11-21 ENCOUNTER — Telehealth: Payer: Self-pay

## 2020-11-21 NOTE — Telephone Encounter (Signed)
LMOM to check on pt to see if he is feeling better.

## 2020-11-26 ENCOUNTER — Encounter: Payer: Self-pay | Admitting: Hospice and Palliative Medicine

## 2020-11-26 ENCOUNTER — Ambulatory Visit (INDEPENDENT_AMBULATORY_CARE_PROVIDER_SITE_OTHER): Payer: Medicare Other | Admitting: Hospice and Palliative Medicine

## 2020-11-26 VITALS — Temp 97.9°F | Resp 16 | Ht 73.0 in | Wt 221.4 lb

## 2020-11-26 DIAGNOSIS — Z8739 Personal history of other diseases of the musculoskeletal system and connective tissue: Secondary | ICD-10-CM

## 2020-11-26 DIAGNOSIS — N5089 Other specified disorders of the male genital organs: Secondary | ICD-10-CM

## 2020-11-26 DIAGNOSIS — R0609 Other forms of dyspnea: Secondary | ICD-10-CM

## 2020-11-26 DIAGNOSIS — R42 Dizziness and giddiness: Secondary | ICD-10-CM | POA: Diagnosis not present

## 2020-11-26 DIAGNOSIS — R0600 Dyspnea, unspecified: Secondary | ICD-10-CM | POA: Diagnosis not present

## 2020-11-26 DIAGNOSIS — R972 Elevated prostate specific antigen [PSA]: Secondary | ICD-10-CM | POA: Diagnosis not present

## 2020-11-26 MED ORDER — ALLOPURINOL 100 MG PO TABS: ORAL_TABLET | ORAL | 3 refills | Status: DC

## 2020-11-26 NOTE — Progress Notes (Signed)
[facility] [place], [place] [address]  Internal MEDICINE  Office Visit Note  Patient Name: [name] [name]  [id number]  [id number]  Date of Service: 11/27/2020  Chief Complaint  Patient presents with  . Follow-up  . Gout  . COPD    Would like to get an inhaler.  . Dizziness    Gets dizzy spells, feels off balance    HPI Patient is here for routine follow-up Recently seen for acute visit for presumably viral gastritis--symptoms have resolved at this time Gout pain has improved, pain is now intermittent in left knee--dull aching pain when he is active or climbing stairs Continues to take Allopurinol to prevent future gout flares Has seen ortho for his knee pain--not wanting to pursuit further evaluation at this time  [facility] with exertion--requesting inhaler, has never been on inhalers in the past, personal smoking history Awaiting final report from recent PFT No echocardiogram on file for review Has also felt dizziness and lightheadedness the last couple of week--started prior to acute gastritis, notices dizziness is more prominent when changing positions  C/o left testicular swelling, has been ongoing for about 1 week, no pain associated, denies urinary or sexual dysfunction No known history of inguinal hernia Declines assessment Established with [facility] urology for elevated PSA levels, has not yet contacted urologist office yet to discuss testicular swelling   Current Medication: Outpatient Encounter Medications as of 11/26/2020  Medication Sig  . busPIRone (BUSPAR) 10 MG tablet Take 1 tablet (10 mg total) by mouth 2 (two) times daily.  . diclofenac Sodium (VOLTAREN) 1 % GEL Apply 4 g topically 4 (four) times daily.  Marland Kitchen ibuprofen (ADVIL) 800 MG tablet Take 800 mg by mouth 2 (two) times daily as needed.  . meloxicam (MOBIC) 7.5 MG tablet Take 1 tablet (7.5 mg total) by mouth daily.  . ondansetron (ZOFRAN ODT) 4 MG disintegrating tablet Take 1 tablet  (4 mg total) by mouth every 8 (eight) hours as needed for nausea or vomiting.  . sildenafil (VIAGRA) 100 MG tablet Take 1 tablet (100 mg total) by mouth daily as needed for erectile dysfunction.  . tamsulosin (FLOMAX) 0.4 MG CAPS capsule Take 1 capsule (0.4 mg total) by mouth daily.  . [DISCONTINUED] allopurinol (ZYLOPRIM) 100 MG tablet Take by mouth. Take 2 tab po daily  . [DISCONTINUED] allopurinol (ZYLOPRIM) 100 MG tablet Take 2 tab po daily  . [DISCONTINUED] diazepam (VALIUM) 10 MG tablet Take 10 mg by mouth daily.  Marland Kitchen allopurinol (ZYLOPRIM) 100 MG tablet Take 2 tab po daily  . buPROPion (WELLBUTRIN SR) 150 MG 12 hr tablet Take 1 tablet (150 mg total) by mouth 2 (two) times daily.   No facility-administered encounter medications on file as of 11/26/2020.    Surgical History: History reviewed. No pertinent surgical history.  Medical History: Past Medical History:  Diagnosis Date  . Arthritis   . COPD (chronic obstructive pulmonary disease) ([place])   . Depression   . Emphysema of lung ([place])     Family History: Family History  Problem Relation Age of Onset  . Alcohol abuse Mother   . Alcohol abuse Father   . Alcohol abuse Brother     Social History   Socioeconomic History  . Marital status: Single    Spouse name: Not on file  . Number of children: Not on file  . Years of education: Not on file  . Highest education level: Not on file  Occupational History  . Not on file  Tobacco Use  . Smoking status: Current Every Day Smoker    Types: Cigarettes  . Smokeless tobacco: Never Used  . Tobacco comment: 10 a day   Substance and Sexual Activity  . Alcohol use: Not Currently  . Drug use: Never  . Sexual activity: Not on file  Other Topics Concern  . Not on file  Social History Narrative  . Not on file   Social Determinants of Health   Financial Resource Strain: Not on file  Food Insecurity: Not on file  Transportation Needs: Not on file  Physical Activity: Not on file   Stress: Not on file  Social Connections: Not on file  Intimate Partner Violence: Not on file      Review of Systems  Constitutional: Negative for chills, fatigue and unexpected weight change.  HENT: Negative for congestion, postnasal drip, rhinorrhea, sneezing and sore throat.   Eyes: Negative for redness.  Respiratory: Positive for shortness of breath. Negative for cough and chest tightness.   Cardiovascular: Negative for chest pain and palpitations.  Gastrointestinal: Negative for abdominal pain, constipation, diarrhea, nausea and vomiting.  Genitourinary: Positive for scrotal swelling. Negative for dysuria and frequency.  Musculoskeletal: Negative for arthralgias, back pain, joint swelling and neck pain.  Skin: Negative for rash.  Neurological: Positive for dizziness. Negative for tremors and numbness.  Hematological: Negative for adenopathy. Does not bruise/bleed easily.  Psychiatric/Behavioral: Negative for behavioral problems (Depression), sleep disturbance and suicidal ideas. The patient is not nervous/anxious.     Vital Signs: Temp 97.9 F (36.6 C)   Resp 16   Ht 6\' 1"  (1.854 m)   Wt 221 lb 6.4 oz (100.4 kg)   SpO2 99%   BMI 29.21 kg/m    Physical Exam Vitals reviewed.  Constitutional:      Appearance: Normal appearance. He is normal weight.  Cardiovascular:     Rate and Rhythm: Normal rate and regular rhythm.     Pulses: Normal pulses.     Heart sounds: Normal heart sounds.  Pulmonary:     Effort: Pulmonary effort is normal.     Breath sounds: Normal breath sounds.  Abdominal:     General: Abdomen is flat.     Palpations: Abdomen is soft.  Musculoskeletal:        General: Normal range of motion.     Cervical back: Normal range of motion.  Skin:    General: Skin is warm.  Neurological:     General: No focal deficit present.     Mental Status: He is alert and oriented to person, place, and time. Mental status is at baseline.  Psychiatric:        Mood  and Affect: Mood normal.        Behavior: Behavior normal.        Thought Content: Thought content normal.        Judgment: Judgment normal.   Assessment/Plan: 1. Dyspnea on exertion Samples of Spiriva given in office today--will await results of PFT before prescribing inhaler Will review echocardiogram for possible underlying cardiac etiology - ECHOCARDIOGRAM COMPLETE; Future  2. Testicular swelling, left Advised to contact urologist today to schedule in office visit Advised if he develops pain in testicle that can be indicator of medical emergency//testicular torsion and would need emergent hospital evaluation Declines physical examination of scrotum//prefers male urologist to examine  3. Personal history of gout Pain resolved at this time in left knee, continue current dose of allopurinol - allopurinol (ZYLOPRIM) 100 MG tablet; Take 2  tab po daily  Dispense: 60 tablet; Refill: 3  4. Elevated PSA Followed and managed by urology  5. Dizziness Negative orthostatic pressures Will review echocardiogram for underlying cause  General Counseling: Kevork verbalizes understanding of the findings of todays visit and agrees with plan of treatment. I have discussed any further diagnostic evaluation that may be needed or ordered today. We also reviewed his medications today. he has been encouraged to call the office with any questions or concerns that should arise related to todays visit.    Orders Placed This Encounter  Procedures  . ECHOCARDIOGRAM COMPLETE    Meds ordered this encounter  Medications  . allopurinol (ZYLOPRIM) 100 MG tablet    Sig: Take 2 tab po daily    Dispense:  60 tablet    Refill:  3    Time spent: 30 Minutes Time spent includes review of chart, medications, test results and follow-up plan with the patient.  This patient was seen by Theodoro Grist AGNP-C in Collaboration with Dr Lavera Guise as a part of collaborative care agreement     Tanna Furry. Pranika Finks  AGNP-C Internal medicine

## 2020-11-27 ENCOUNTER — Encounter: Payer: Self-pay | Admitting: Hospice and Palliative Medicine

## 2020-11-28 ENCOUNTER — Encounter: Payer: Self-pay | Admitting: Hospice and Palliative Medicine

## 2020-11-28 ENCOUNTER — Other Ambulatory Visit: Payer: Self-pay | Admitting: Hospice and Palliative Medicine

## 2020-11-28 ENCOUNTER — Encounter: Payer: Self-pay | Admitting: Urology

## 2020-11-28 ENCOUNTER — Ambulatory Visit (INDEPENDENT_AMBULATORY_CARE_PROVIDER_SITE_OTHER): Payer: Medicare Other | Admitting: Urology

## 2020-11-28 ENCOUNTER — Other Ambulatory Visit: Payer: Self-pay

## 2020-11-28 VITALS — BP 103/67 | HR 86 | Ht 73.0 in | Wt 226.0 lb

## 2020-11-28 DIAGNOSIS — N433 Hydrocele, unspecified: Secondary | ICD-10-CM | POA: Diagnosis not present

## 2020-11-28 DIAGNOSIS — N528 Other male erectile dysfunction: Secondary | ICD-10-CM

## 2020-11-28 DIAGNOSIS — F410 Panic disorder [episodic paroxysmal anxiety] without agoraphobia: Secondary | ICD-10-CM

## 2020-11-28 LAB — URINALYSIS, COMPLETE
Bilirubin, UA: NEGATIVE
Glucose, UA: NEGATIVE
Ketones, UA: NEGATIVE
Leukocytes,UA: NEGATIVE
Nitrite, UA: NEGATIVE
Protein,UA: NEGATIVE
Specific Gravity, UA: 1.015 (ref 1.005–1.030)
Urobilinogen, Ur: 1 mg/dL (ref 0.2–1.0)
pH, UA: 5.5 (ref 5.0–7.5)

## 2020-11-28 LAB — MICROSCOPIC EXAMINATION
Bacteria, UA: NONE SEEN
RBC, Urine: NONE SEEN /hpf (ref 0–2)

## 2020-11-28 MED ORDER — BUSPIRONE HCL 10 MG PO TABS: 10.0000 mg | ORAL_TABLET | Freq: Two times a day (BID) | ORAL | 1 refills | Status: DC

## 2020-11-28 MED ORDER — SILDENAFIL CITRATE 100 MG PO TABS: 100.0000 mg | ORAL_TABLET | Freq: Every day | ORAL | 0 refills | Status: DC | PRN

## 2020-11-28 MED ORDER — BUPROPION HCL ER (SR) 150 MG PO TB12: 150.0000 mg | ORAL_TABLET | Freq: Two times a day (BID) | ORAL | 1 refills | Status: DC

## 2020-11-28 NOTE — Progress Notes (Signed)
11/28/2020 3:34 PM   [name] [name] [birth year] [id number]  Referring provider: Luiz Ochoa, NP [place],  [place] [address]  Chief Complaint  Patient presents with  . Groin Swelling    HPI: 65 y.o. male presents for evaluation of left hemiscrotal swelling.   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 Arthritis   . COPD (chronic obstructive pulmonary disease) ([place])   . Depression   . Emphysema of lung [facility])     Surgical History: History reviewed. No pertinent surgical history.  Home Medications:  Allergies as of 11/28/2020   No Known Allergies     Medication List       Accurate as of November 28, 2020  3:34 PM. If you have any questions, ask your nurse or doctor.        allopurinol 100 MG tablet Commonly known as: ZYLOPRIM Take 2 tab po daily   buPROPion 150 MG 12 hr tablet Commonly known as: Wellbutrin SR Take 1 tablet (150 mg total) by mouth 2 (two) times daily.   busPIRone 10 MG tablet Commonly known as: BUSPAR Take 1 tablet (10 mg total) by mouth 2 (two) times daily.   diclofenac Sodium 1 % Gel Commonly known as: Voltaren Apply 4 g topically 4 (four) times daily.   ibuprofen 800 MG tablet Commonly known as: ADVIL Take 800 mg by mouth 2 (two) times daily as needed.   meloxicam 7.5 MG tablet Commonly known as: MOBIC Take 1 tablet (7.5 mg total) by mouth daily.   ondansetron 4 MG  disintegrating tablet Commonly known as: Zofran ODT Take 1 tablet (4 mg total) by mouth every 8 (eight) hours as needed for nausea or vomiting.   sildenafil 100 MG tablet Commonly known as: VIAGRA Take 1 tablet (100 mg total) by mouth daily as needed for erectile dysfunction.   tamsulosin 0.4 MG Caps capsule Commonly known as: FLOMAX Take 1 capsule (0.4 mg total) by mouth daily.       Allergies: No Known Allergies  Family History: Family History  Problem Relation Age of Onset  . Alcohol abuse Mother   . Alcohol abuse Father   . Alcohol abuse Brother     Social History:  reports that he has been smoking cigarettes. He has never used smokeless tobacco. He reports previous alcohol use. He reports that he does not use drugs.   Physical Exam: BP 103/67   Pulse 86   Ht 6\' 1"  (1.854 m)   Wt 226 lb (102.5 kg)   BMI 29.82 kg/m   Constitutional:  Alert and oriented, No acute distress. HEENT:  AT, moist mucus membranes.  Trachea midline, no masses. Cardiovascular: No clubbing, cyanosis, or edema. Respiratory: Normal respiratory effort, no increased work of breathing. GU: Phallus without lesions, right testis descended palpably normal; moderate-large left hydrocele, left testis not palpable.  No evidence hernia Skin: No rashes, bruises or suspicious lesions. Neurologic: Grossly intact, no focal deficits, moving all 4 extremities. Psychiatric: Normal  mood and affect.    Assessment & Plan:    1.  Left hydrocele  New problem  Asymptomatic  Recommend scrotal ultrasound since left testis not palpable  Will call with results  2.  T1c prostate cancer  Very low risk  On active surveillance at [facility], [place] [address] [place], [place] [address] [phone]

## 2020-12-08 ENCOUNTER — Ambulatory Visit: Payer: Medicare Other | Admitting: Physician Assistant

## 2020-12-09 ENCOUNTER — Telehealth: Payer: Self-pay

## 2020-12-09 NOTE — Telephone Encounter (Signed)
Confirmed and screened for 12-10-20 ultrasound. Advised of copay. Loma Sousa

## 2020-12-10 ENCOUNTER — Ambulatory Visit: Payer: Medicare Other

## 2020-12-10 ENCOUNTER — Other Ambulatory Visit: Payer: Self-pay | Admitting: Hospice and Palliative Medicine

## 2020-12-10 ENCOUNTER — Other Ambulatory Visit: Payer: Self-pay

## 2020-12-10 DIAGNOSIS — R0609 Other forms of dyspnea: Secondary | ICD-10-CM

## 2020-12-10 DIAGNOSIS — R0600 Dyspnea, unspecified: Secondary | ICD-10-CM

## 2020-12-10 DIAGNOSIS — R0602 Shortness of breath: Secondary | ICD-10-CM | POA: Diagnosis not present

## 2020-12-15 ENCOUNTER — Encounter: Payer: Self-pay | Admitting: Emergency Medicine

## 2020-12-15 ENCOUNTER — Ambulatory Visit
Admission: EM | Admit: 2020-12-15 | Discharge: 2020-12-15 | Disposition: A | Discharge status: Home / Self Care | Payer: Medicare Other | Attending: Family Medicine | Admitting: Family Medicine

## 2020-12-15 DIAGNOSIS — R059 Cough, unspecified: Secondary | ICD-10-CM

## 2020-12-15 MED ORDER — BENZONATATE 100 MG PO CAPS: 100.0000 mg | ORAL_CAPSULE | Freq: Three times a day (TID) | ORAL | 0 refills | Status: DC

## 2020-12-15 MED ORDER — AZITHROMYCIN 250 MG PO TABS: 250.0000 mg | ORAL_TABLET | Freq: Every day | ORAL | 0 refills | Status: DC

## 2020-12-15 NOTE — ED Triage Notes (Signed)
Patient in office [place] for persistent cough x 2wk is currently a smoker. Has productive mucus    Denies N/V, Fever  AFO:ADLK

## 2020-12-15 NOTE — Discharge Instructions (Signed)
Take the medications as prescribed Follow up as needed for continued or worsening symptoms

## 2020-12-16 NOTE — ED Provider Notes (Signed)
Roderic Palau    CSN: [id number] Arrival date & time: 12/15/20  1538      History   Chief Complaint Chief Complaint  Patient presents with  . Cough    HPI [name] [name] is a 65 y.o. male.   Pt is a 65 year old male presents today with complaints of productive cough x2 weeks.  History of COPD and current everyday smoker.  Has had green/yellow mucus.  Denies any chest pain, shortness of breath, fevers.  Has not taken any medicines for symptoms.  Does use inhalers daily.  No associated nasal congestion, rhinorrhea, sinus pressure, ear pain or sore throat   Cough   Past Medical History:  Diagnosis Date  . Arthritis   . COPD (chronic obstructive pulmonary disease) ([place])   . Depression   . Emphysema of lung [facility])     Patient Active Problem List   Diagnosis Date Noted  . Elevated PSA 12/26/2019  . Benign prostatic hyperplasia with urinary frequency 12/26/2019    History reviewed. No pertinent surgical history.     Home Medications    Prior to Admission medications   Medication Sig Start Date End Date Taking? Authorizing Provider  azithromycin (ZITHROMAX) 250 MG tablet Take 1 tablet (250 mg total) by mouth daily. Take first 2 tablets together, then 1 every day until finished. 12/15/20  Yes Lanaysia Fritchman A, NP  benzonatate (TESSALON) 100 MG capsule Take 1 capsule (100 mg total) by mouth every 8 (eight) hours. 12/15/20  Yes Alvin Diffee A, NP  allopurinol (ZYLOPRIM) 100 MG tablet Take 2 tab po daily 11/26/20   Luiz Ochoa, NP  buPROPion [facility] SR) 150 MG 12 hr tablet Take 1 tablet (150 mg total) by mouth 2 (two) times daily. 11/28/20 05/27/21  Luiz Ochoa, NP  busPIRone (BUSPAR) 10 MG tablet Take 1 tablet (10 mg total) by mouth 2 (two) times daily. 11/28/20   Luiz Ochoa, NP  ibuprofen (ADVIL) 800 MG tablet Take 800 mg by mouth 2 (two) times daily as needed.    [provider]  meloxicam (MOBIC) 7.5 MG tablet TAKE 1 TABLET(7.5 MG) BY MOUTH  DAILY 12/10/20   Luiz Ochoa, NP  ondansetron (ZOFRAN ODT) 4 MG disintegrating tablet Take 1 tablet (4 mg total) by mouth every 8 (eight) hours as needed for nausea or vomiting. 11/18/20   Luiz Ochoa, NP  sildenafil (VIAGRA) 100 MG tablet Take 1 tablet (100 mg total) by mouth daily as needed for erectile dysfunction. 11/28/20   Luiz Ochoa, NP    Family History Family History  Problem Relation Age of Onset  . Alcohol abuse Mother   . Alcohol abuse Father   . Alcohol abuse Brother     Social History Social History   Tobacco Use  . Smoking status: Current Every Day Smoker    Types: Cigarettes  . Smokeless tobacco: Never Used  . Tobacco comment: 10 a day   Substance Use Topics  . Alcohol use: Not Currently  . Drug use: Never     Allergies   Patient has no known allergies.   Review of Systems Review of Systems  Respiratory: Positive for cough.      Physical Exam Triage Vital Signs ED Triage Vitals  Enc Vitals Group     BP 12/15/20 1601 108/74     Pulse Rate 12/15/20 1601 71     Resp 12/15/20 1601 20     Temp 12/15/20 1601 (!) 97.3 F (36.3 C)  Temp Source 12/15/20 1601 Temporal     SpO2 12/15/20 1601 96 %     Weight 12/15/20 1551 218 lb (98.9 kg)     Height 12/15/20 1551 6\' 1"  (1.854 m)     Head Circumference --      Peak Flow --      Pain Score 12/15/20 1551 0     Pain Loc --      Pain Edu? --      Excl. in [place]? --    No data found.  Updated Vital Signs BP 108/74 (BP Location: Left Arm)   Pulse 71   Temp (!) 97.3 F (36.3 C) (Temporal)   Resp 20   Ht 6\' 1"  (1.854 m)   Wt 218 lb (98.9 kg)   SpO2 96%   BMI 28.76 kg/m   Visual Acuity Right Eye Distance:   Left Eye Distance:   Bilateral Distance:    Right Eye Near:   Left Eye Near:    Bilateral Near:     Physical Exam Vitals and nursing note reviewed.  Constitutional:      General: He is not in acute distress.    Appearance: Normal appearance. He is not ill-appearing,  toxic-appearing or diaphoretic.  HENT:     Head: Normocephalic and atraumatic.     Right Ear: Tympanic membrane and ear canal normal.     Left Ear: Tympanic membrane and ear canal normal.     Nose: Nose normal.     Mouth/Throat:     Comments: PND Eyes:     Conjunctiva/sclera: Conjunctivae normal.  Cardiovascular:     Rate and Rhythm: Normal rate and regular rhythm.  Pulmonary:     Effort: Pulmonary effort is normal.     Breath sounds: Normal breath sounds.     Comments: Decreased in bases  Musculoskeletal:        General: Normal range of motion.     Cervical back: Normal range of motion.  Skin:    General: Skin is warm and dry.  Neurological:     Mental Status: He is alert.  Psychiatric:        Mood and Affect: Mood normal.      UC Treatments / Results  Labs (all labs ordered are listed, but only abnormal results are displayed) Labs Reviewed - No data to display  EKG   Radiology No results found.  Procedures Procedures (including critical care time)  Medications Ordered in UC Medications - No data to display  Initial Impression / Assessment and Plan / UC Course  I have reviewed the triage vital signs and the nursing notes.  Pertinent labs & imaging results that were available during my care of the patient were reviewed by me and considered in my medical decision making (see chart for details).     Cough 2 weeks productive cough. Hx of COPD and current smoker.  Treating with azithromycin and tessalon pearls for cough as needed.  Follow up as needed for continued or worsening symptoms  Final Clinical Impressions(s) / UC Diagnoses   Final diagnoses:  Cough     Discharge Instructions     Take the medications as prescribed Follow up as needed for continued or worsening symptoms    ED Prescriptions    Medication Sig Dispense Auth. Provider   azithromycin (ZITHROMAX) 250 MG tablet Take 1 tablet (250 mg total) by mouth daily. Take first 2 tablets  together, then 1 every day until finished. 6 tablet Jad Johansson A,  NP   benzonatate (TESSALON) 100 MG capsule Take 1 capsule (100 mg total) by mouth every 8 (eight) hours. 21 capsule Aryana Wonnacott A, NP     PDMP not reviewed this encounter.   Loura Halt A, NP 12/16/20 [phone]

## 2020-12-23 ENCOUNTER — Ambulatory Visit
Admission: RE | Admit: 2020-12-23 | Discharge: 2020-12-23 | Disposition: A | Discharge status: Home / Self Care | Payer: Medicare Other | Source: Ambulatory Visit | Attending: Urology | Admitting: Urology

## 2020-12-23 ENCOUNTER — Other Ambulatory Visit: Payer: Self-pay

## 2020-12-23 ENCOUNTER — Telehealth: Payer: Self-pay | Admitting: *Deleted

## 2020-12-23 DIAGNOSIS — N433 Hydrocele, unspecified: Secondary | ICD-10-CM | POA: Diagnosis not present

## 2020-12-23 NOTE — Telephone Encounter (Signed)
-----   Message from Abbie Sons, MD sent at 12/23/2020 12:54 PM EDT ----- Scrotal ultrasound confirms large left hydrocele.  The testis was normal in appearance.  No treatment is needed unless the hydrocele is bothersome then surgery is the best option

## 2020-12-23 NOTE — Telephone Encounter (Signed)
Pt aware of results. He is not having any discomfort.

## 2021-01-20 DIAGNOSIS — M13862 Other specified arthritis, left knee: Secondary | ICD-10-CM | POA: Diagnosis not present

## 2021-01-27 ENCOUNTER — Encounter: Payer: Self-pay | Admitting: Nurse Practitioner

## 2021-01-27 ENCOUNTER — Other Ambulatory Visit: Payer: Self-pay

## 2021-01-27 ENCOUNTER — Ambulatory Visit (INDEPENDENT_AMBULATORY_CARE_PROVIDER_SITE_OTHER): Payer: Medicare Other | Admitting: Nurse Practitioner

## 2021-01-27 VITALS — BP 118/76 | HR 79 | Temp 97.2°F | Resp 16 | Ht 73.0 in | Wt 210.6 lb

## 2021-01-27 DIAGNOSIS — Z1211 Encounter for screening for malignant neoplasm of colon: Secondary | ICD-10-CM | POA: Diagnosis not present

## 2021-01-27 DIAGNOSIS — F17219 Nicotine dependence, cigarettes, with unspecified nicotine-induced disorders: Secondary | ICD-10-CM

## 2021-01-27 DIAGNOSIS — N433 Hydrocele, unspecified: Secondary | ICD-10-CM

## 2021-01-27 DIAGNOSIS — R300 Dysuria: Secondary | ICD-10-CM

## 2021-01-27 DIAGNOSIS — Z0001 Encounter for general adult medical examination with abnormal findings: Secondary | ICD-10-CM | POA: Diagnosis not present

## 2021-01-27 DIAGNOSIS — F320 Major depressive disorder, single episode, mild: Secondary | ICD-10-CM | POA: Diagnosis not present

## 2021-01-27 DIAGNOSIS — J449 Chronic obstructive pulmonary disease, unspecified: Secondary | ICD-10-CM

## 2021-01-27 MED ORDER — BUPROPION HCL ER (XL) 150 MG PO TB24
150.0000 mg | ORAL_TABLET | Freq: Every day | ORAL | 1 refills | Status: DC
Start: 2021-01-27 — End: 2021-05-20

## 2021-01-27 MED ORDER — FLUTICASONE-SALMETEROL 100-50 MCG/ACT IN AEPB: 1.0000 | INHALATION_SPRAY | Freq: Two times a day (BID) | RESPIRATORY_TRACT | 3 refills | Status: DC

## 2021-01-27 NOTE — Progress Notes (Addendum)
[facility] [place], [place] [address]  Internal MEDICINE  Office Visit Note  Patient Name: [name] [name]  [id number]  [id number]  Date of Service: 02/02/2021  Chief Complaint  Patient presents with  . Medicare Wellness    Review echo  . Depression  . COPD    HPI   Pt presents for an annual physical/wellnes visit.-pt reports that his breathing is "bad", he states he has shortness of breath, feels like he cannot take a full deep breath sometimes. He reports using his rescue inhaler 2-3 times a week or less.  -recent ultrasound of scrotum wnl -Recent echocardiogram wnl with mild aortic dilation, recommend to repeat in one year to monitor the aortic dilation.   -depressive symptoms are well controlled with wellbutrin.   Current Medication: Outpatient Encounter Medications as of 01/27/2021  Medication Sig  . allopurinol (ZYLOPRIM) 100 MG tablet Take 2 tab po daily  . azithromycin (ZITHROMAX) 250 MG tablet Take 1 tablet (250 mg total) by mouth daily. Take first 2 tablets together, then 1 every day until finished.  . benzonatate (TESSALON) 100 MG capsule Take 1 capsule (100 mg total) by mouth every 8 (eight) hours.  Marland Kitchen buPROPion (WELLBUTRIN XL) 150 MG 24 hr tablet Take 1 tablet (150 mg total) by mouth daily.  . busPIRone (BUSPAR) 10 MG tablet Take 1 tablet (10 mg total) by mouth 2 (two) times daily.  . fluticasone-salmeterol (ADVAIR) 100-50 MCG/ACT AEPB Inhale 1 puff into the lungs 2 (two) times daily. Rinse mouth after use.  . ibuprofen (ADVIL) 800 MG tablet Take 800 mg by mouth 2 (two) times daily as needed.  . meloxicam (MOBIC) 7.5 MG tablet TAKE 1 TABLET(7.5 MG) BY MOUTH DAILY  . ondansetron (ZOFRAN ODT) 4 MG disintegrating tablet Take 1 tablet (4 mg total) by mouth every 8 (eight) hours as needed for nausea or vomiting.  . sildenafil (VIAGRA) 100 MG tablet Take 1 tablet (100 mg total) by mouth daily as needed for erectile dysfunction.  .  [DISCONTINUED] buPROPion (WELLBUTRIN SR) 150 MG 12 hr tablet Take 1 tablet (150 mg total) by mouth 2 (two) times daily.   No facility-administered encounter medications on file as of 01/27/2021.    Surgical History: History reviewed. No pertinent surgical history.  Medical History: Past Medical History:  Diagnosis Date  . Arthritis   . COPD (chronic obstructive pulmonary disease) ([place])   . Depression   . Emphysema of lung ([place])   . Gout    left knee    Family History: Family History  Problem Relation Age of Onset  . Alcohol abuse Mother   . Alcohol abuse Father   . Alcohol abuse Brother     Social History   Socioeconomic History  . Marital status: Single    Spouse name: Not on file  . Number of children: Not on file  . Years of education: Not on file  . Highest education level: Not on file  Occupational History  . Not on file  Tobacco Use  . Smoking status: Current Every Day Smoker    Types: Cigarettes  . Smokeless tobacco: Never Used  . Tobacco comment: 10 a day   Substance and Sexual Activity  . Alcohol use: Not Currently  . Drug use: Never  . Sexual activity: Not on file  Other Topics Concern  . Not on file  Social History Narrative  . Not on file   Social Determinants of Health   Financial Resource Strain: Not on  file  Food Insecurity: Not on file  Transportation Needs: Not on file  Physical Activity: Not on file  Stress: Not on file  Social Connections: Not on file  Intimate Partner Violence: Not on file      Review of Systems  Constitutional: Negative.  Negative for chills, fatigue and unexpected weight change.  HENT: Negative for congestion, rhinorrhea, sneezing and sore throat.   Eyes: Negative.  Negative for redness.  Respiratory: Positive for shortness of breath. Negative for cough and chest tightness.   Cardiovascular: Negative.  Negative for chest pain and palpitations.  Gastrointestinal: Negative.  Negative for abdominal pain,  constipation, diarrhea, nausea and vomiting.  Genitourinary: Negative.  Negative for dysuria and frequency.  Musculoskeletal: Negative.  Negative for arthralgias, back pain, joint swelling and neck pain.  Skin: Negative.  Negative for rash.  Neurological: Negative.  Negative for tremors and numbness.  Hematological: Negative for adenopathy. Does not bruise/bleed easily.  Psychiatric/Behavioral: Negative.  Negative for behavioral problems (Depression), sleep disturbance and suicidal ideas. The patient is not nervous/anxious.     Vital Signs: BP 118/76   Pulse 79   Temp (!) 97.2 F (36.2 C)   Resp 16   Ht 6\' 1"  (1.854 m)   Wt 210 lb 9.6 oz (95.5 kg)   SpO2 97%   BMI 27.79 kg/m    Physical Exam Constitutional:      Appearance: Normal appearance.  HENT:     Head: Normocephalic and atraumatic.     Right Ear: Tympanic membrane, ear canal and external ear normal.     Left Ear: Tympanic membrane, ear canal and external ear normal.     Nose: Nose normal.     Mouth/Throat:     Mouth: Mucous membranes are moist.     Pharynx: Oropharynx is clear.  Eyes:     Extraocular Movements: Extraocular movements intact.     Conjunctiva/sclera: Conjunctivae normal.     Pupils: Pupils are equal, round, and reactive to light.  Cardiovascular:     Rate and Rhythm: Normal rate and regular rhythm.     Pulses: Normal pulses.     Heart sounds: Normal heart sounds.  Pulmonary:     Effort: Pulmonary effort is normal.     Breath sounds: Normal breath sounds.  Abdominal:     General: Abdomen is flat.     Palpations: Abdomen is soft.  Genitourinary:    Testes:        Left: Testicular hydrocele present.  Musculoskeletal:        General: Normal range of motion.     Cervical back: Normal range of motion and neck supple.  Skin:    General: Skin is warm and dry.     Capillary Refill: Capillary refill takes less than 2 seconds.  Neurological:     Mental Status: He is alert and oriented to person,  place, and time.  Psychiatric:        Mood and Affect: Mood normal.        Behavior: Behavior normal.    Assessment/Plan: . Encounter for general adult medical examination with abnormal findings Age appropriate screening is done. Psa, CT chest and colonoscopy.   What is lung cancer screening?   Lung cancer screening is a way in which doctors check the lungs for early signs of cancer in people who have no symptoms of lung cancer. Doctors suggest this screening for certain people who are at high risk of lung cancer because they smoke, or used to  smoke. Although screening is not likely to be helpful for all smokers, doctors do think it might help prevent cancer deaths in people who smoke a lot or smoked for many years (even if they have already quit). Researchers have studied chest X-rays and "low-dose CT scans," two types of imaging tests, to see if they are good screening tools. Imaging tests create pictures of the inside of the body. A low-dose CT scan uses much less radiation than a typical CT scan and shows a more detailed image of the lungs than a standard X-ray. It turns out that chest X-rays do not work for screening for lung cancer. Low-dose CT scans, on the other hand, are helpful screening tools for some people at high risk of lung cancer. The goal of lung cancer screening is to find cancer early, before it has a chance to grow, spread, or cause problems. Several large studies found that smokers who were screened with low-dose CT scans were less likely to die of lung cancer than those who were screened with a standard X-ray.  The best way to lower your chances of getting or dying from lung cancer is to quit smoking. No matter how much or how long you have smoked, quitting is a good idea. Quitting now will reduce your chances not only of lung problems, but also of heart disease and many forms of cancer.  . Chronic obstructive pulmonary disease, unspecified COPD type ([place]) Previous PFT wnl from  February 2021. Pt reports breathing is not good and he gets short of breath often. PFT ordered to evaluate for any changes. advair inhaler prescribed for patient's SOB. Will follow up to evaluate respiratory status and efficacy of advair.might need further work up  - fluticasone-salmeterol (ADVAIR) 100-50 MCG/ACT AEPB; Inhale 1 puff into the lungs 2 (two) times daily. Rinse mouth after use.  Dispense: 1 each; Refill: 3 - Pulmonary Function Test; Future - CT CHEST LUNG CANCER SCREENING LOW DOSE WO CONTRAST; Future . Depression, major, single episode, mild (HCC) History of depression, being treated with wellbutrin. Depressive symptoms have been well controlled with this medication. No changes to current treatment plan, refill of wellbutrin ordered. - buPROPion (WELLBUTRIN XL) 150 MG 24 hr tablet; Take 1 tablet (150 mg total) by mouth daily.  Dispense: 90 tablet; Refill: 1  . Hydrocele of testis Reviewed ultrasound of scrotum with patient. Left hydrocele confirmed on ultrasound. The hydrocele is nontender and not painful. Pt states it does not bother him. Discussed with patient the option to have a surgical referral to have the hydrocele removed if it bothers him or becomes a problem and that no treatment is required if the hydrocele is asymptomatic. The patient acknowledged understanding of this and declined the referral at this time.   . Cigarette nicotine dependence with nicotine-induced disorder Smoking cessation counseling: 1. Pt acknowledges the risks of long term smoking, she will try to quite smoking. 2. Options for different medications including nicotine products, chewing gum, patch etc, Wellbutrin and Chantix is discussed 3. Goal and date of compete cessation is discussed 4. Total time spent in smoking cessation is 15 min. - CT CHEST LUNG CANCER SCREENING LOW DOSE WO CONTRAST; Future . Dysuria History of dysuria - UA/M w/rflx Culture, Routine - Microscopic Examination - Urine Culture,  Reflex  . Screening for malignant neoplasm of colon - Ambulatory referral to Gastroenterology  General Counseling: iverson sees understanding of the findings of todays visit and agrees with plan of treatment. I have discussed any further  diagnostic evaluation that may be needed or ordered today. We also reviewed his medications today. he has been encouraged to call the office with any questions or concerns that should arise related to todays visit.    Orders Placed This Encounter  Procedures  . Microscopic Examination  . Urine Culture, Reflex  . CT CHEST LUNG CANCER SCREENING LOW DOSE WO CONTRAST  . UA/M w/rflx Culture, Routine  . Ambulatory referral to Gastroenterology  . Pulmonary Function Test    Meds ordered this encounter  Medications  . buPROPion (WELLBUTRIN XL) 150 MG 24 hr tablet    Sig: Take 1 tablet (150 mg total) by mouth daily.    Dispense:  90 tablet    Refill:  1  . fluticasone-salmeterol (ADVAIR) 100-50 MCG/ACT AEPB    Sig: Inhale 1 puff into the lungs 2 (two) times daily. Rinse mouth after use.    Dispense:  1 each    Refill:  3   Return in about 2 months (around 03/29/2021) for F/U  pulmonary/sleep review PFT , Rain Friedt PCP.   Total time spent:40 Minutes Time spent includes review of chart, medications, test results, and follow up plan with the patient.   [place] Controlled Substance Database was reviewed by me.  This patient was seen by Jonetta Osgood, FNP-C in collaboration with Dr. Clayborn Bigness as a part of collaborative care agreement.  Dr Lavera Guise Internal medicine

## 2021-01-31 LAB — UA/M W/RFLX CULTURE, ROUTINE
Bilirubin, UA: NEGATIVE
Glucose, UA: NEGATIVE
Ketones, UA: NEGATIVE
Nitrite, UA: NEGATIVE
Protein,UA: NEGATIVE
RBC, UA: NEGATIVE
Specific Gravity, UA: 1.02 (ref 1.005–1.030)
Urobilinogen, Ur: 0.2 mg/dL (ref 0.2–1.0)
pH, UA: 5.5 (ref 5.0–7.5)

## 2021-01-31 LAB — MICROSCOPIC EXAMINATION
Bacteria, UA: NONE SEEN
Casts: NONE SEEN /lpf
Epithelial Cells (non renal): NONE SEEN /hpf (ref 0–10)

## 2021-01-31 LAB — URINE CULTURE, REFLEX: Organism ID, Bacteria: NO GROWTH

## 2021-02-17 DIAGNOSIS — M11261 Other chondrocalcinosis, right knee: Secondary | ICD-10-CM | POA: Diagnosis not present

## 2021-02-17 DIAGNOSIS — M11262 Other chondrocalcinosis, left knee: Secondary | ICD-10-CM | POA: Diagnosis not present

## 2021-02-17 DIAGNOSIS — M1712 Unilateral primary osteoarthritis, left knee: Secondary | ICD-10-CM | POA: Diagnosis not present

## 2021-02-18 ENCOUNTER — Ambulatory Visit: Payer: Medicare Other | Admitting: Internal Medicine

## 2021-02-18 ENCOUNTER — Other Ambulatory Visit: Payer: Self-pay

## 2021-02-18 DIAGNOSIS — R0602 Shortness of breath: Secondary | ICD-10-CM

## 2021-02-18 DIAGNOSIS — J449 Chronic obstructive pulmonary disease, unspecified: Secondary | ICD-10-CM

## 2021-02-18 LAB — PULMONARY FUNCTION TEST

## 2021-02-22 NOTE — Procedures (Signed)
[facility] [address] [place], [address]    Complete Pulmonary Function Testing Interpretation:  FINDINGS:  Forced vital capacity is moderately decreased.  FEV1 is 2.25 L which is 62% of predicted and is moderately decreased.  Postbronchodilator there was significant improvement in the FEV1.  FEV1 FVC ratio is mildly decreased.  Total lung capacity is moderately decreased.  Residual volume is decreased residual in total and past ratio is increased FRC is decreased.  DLCO is moderately decreased.  IMPRESSION:  This pulmonary function study is consistent with moderate obstructive lung disease as well as moderate restrictive lung disease.  There does appear to be response to bronchodilator  Allyne Gee, MD [facility] Pulmonary Critical Care Medicine Sleep Medicine

## 2021-02-25 ENCOUNTER — Telehealth: Payer: Self-pay | Admitting: *Deleted

## 2021-02-25 ENCOUNTER — Encounter: Payer: Self-pay | Admitting: *Deleted

## 2021-02-25 DIAGNOSIS — Z122 Encounter for screening for malignant neoplasm of respiratory organs: Secondary | ICD-10-CM

## 2021-02-25 DIAGNOSIS — Z87891 Personal history of nicotine dependence: Secondary | ICD-10-CM

## 2021-02-25 DIAGNOSIS — F17200 Nicotine dependence, unspecified, uncomplicated: Secondary | ICD-10-CM

## 2021-02-25 NOTE — Telephone Encounter (Signed)
Received referral for initial lung cancer screening scan. Contacted patient and obtained smoking history,(current 104 pack year) as well as answering questions related to screening process. Patient denies signs of lung cancer such as weight loss or hemoptysis. Patient denies comorbidity that would prevent curative treatment if lung cancer were found. Patient is scheduled for shared decision making visit and CT scan on 03/04/21 at 1015am.

## 2021-03-04 ENCOUNTER — Ambulatory Visit
Admission: RE | Admit: 2021-03-04 | Discharge: 2021-03-04 | Disposition: A | Discharge status: Home / Self Care | Payer: Medicare Other | Source: Ambulatory Visit | Attending: Oncology | Admitting: Oncology

## 2021-03-04 ENCOUNTER — Inpatient Hospital Stay: Payer: Medicare Other | Attending: Oncology | Admitting: Hospice and Palliative Medicine

## 2021-03-04 ENCOUNTER — Other Ambulatory Visit: Payer: Self-pay

## 2021-03-04 DIAGNOSIS — Z87891 Personal history of nicotine dependence: Secondary | ICD-10-CM | POA: Insufficient documentation

## 2021-03-04 DIAGNOSIS — Z122 Encounter for screening for malignant neoplasm of respiratory organs: Secondary | ICD-10-CM

## 2021-03-04 DIAGNOSIS — F17200 Nicotine dependence, unspecified, uncomplicated: Secondary | ICD-10-CM | POA: Insufficient documentation

## 2021-03-04 NOTE — Progress Notes (Signed)
Virtual Visit via Telephone note  I connected withNAME@ on 03/04/21 [email or web address] by telephone and verified that I am speaking with the correct person using two identifiers.   I discussed the limitations of evaluation and management by telemedicine and the availability of in person appointments. The patient expressed understanding and agreed to proceed.  Location: Patient: [facility] Provider: Clinic   In accordance with CMS guidelines, patient has met eligibility criteria including age, absence of signs or symptoms of lung cancer.  Social History   Tobacco Use   Smoking status: Every Day    Packs/day: 2.00    Years: 52.00    Pack years: 104.00    Types: Cigarettes   Smokeless tobacco: Never   Tobacco comments:    10 a day currently  Substance Use Topics   Alcohol use: Not Currently   Drug use: Never      A shared decision-making session was conducted prior to the performance of CT scan. This includes one or more decision aids, includes benefits and harms of screening, follow-up diagnostic testing, over-diagnosis, false positive rate, and total radiation exposure.   Counseling on the importance of adherence to annual lung cancer LDCT screening, impact of co-morbidities, and ability or willingness to undergo diagnosis and treatment is imperative for compliance of the program.   Counseling on the importance of continued smoking cessation for former smokers; the importance of smoking cessation for current smokers, and information about tobacco cessation interventions have been given to patient including [place] and [employer] quit [place] programs.   Written order for lung cancer screening with LDCT has been given to the patient and any and all questions have been answered to the best of my abilities.    Yearly follow up will be coordinated by Burgess Estelle, Thoracic Navigator.  Time Total: 5 minutes  Visit consisted of counseling and education dealing with complex health  screening. Greater than 50%  of this time was spent counseling and coordinating care related to the above assessment and plan.  Signed by: Altha Harm, PhD, NP-C

## 2021-03-16 ENCOUNTER — Other Ambulatory Visit: Payer: Self-pay

## 2021-03-16 ENCOUNTER — Ambulatory Visit: Payer: Medicare Other | Admitting: Internal Medicine

## 2021-03-16 ENCOUNTER — Encounter: Payer: Self-pay | Admitting: *Deleted

## 2021-03-16 ENCOUNTER — Encounter: Payer: Self-pay | Admitting: Internal Medicine

## 2021-03-16 VITALS — BP 104/74 | HR 66 | Temp 98.1°F | Resp 16 | Ht 73.0 in | Wt 212.2 lb

## 2021-03-16 DIAGNOSIS — J449 Chronic obstructive pulmonary disease, unspecified: Secondary | ICD-10-CM

## 2021-03-16 DIAGNOSIS — R0602 Shortness of breath: Secondary | ICD-10-CM | POA: Diagnosis not present

## 2021-03-16 DIAGNOSIS — F1011 Alcohol abuse, in remission: Secondary | ICD-10-CM | POA: Diagnosis not present

## 2021-03-16 DIAGNOSIS — F17219 Nicotine dependence, cigarettes, with unspecified nicotine-induced disorders: Secondary | ICD-10-CM

## 2021-03-16 MED ORDER — TRELEGY ELLIPTA 100-62.5-25 MCG/INH IN AEPB: 1.0000 | INHALATION_SPRAY | Freq: Every day | RESPIRATORY_TRACT | 11 refills | Status: DC

## 2021-03-16 MED ORDER — ALBUTEROL SULFATE HFA 108 (90 BASE) MCG/ACT IN AERS: 2.0000 | INHALATION_SPRAY | Freq: Four times a day (QID) | RESPIRATORY_TRACT | 2 refills | Status: DC | PRN

## 2021-03-16 NOTE — Progress Notes (Signed)
[facility] [place], [place] [address]  Pulmonary Sleep Medicine   Office Visit Note  Patient Name: [name] [name] DOB: 02/06/1956 MRN [id number]  Date of Service: 03/16/2021  Complaints/HPI: SOB COPD. He states he has a diagnosis of COPD. Apparently was diagnosed 6-9 months ago. He has been a smoker and still smokes half pack a day. States he does not smoke the whole cigarette. Patient notes SOB on exertion and when he walks up incline or stairs. He states he does have a smokers cough. He notes sputum which is clear when he first wakes up. Right now he uses advair twice a day. Feels improvement with the advair but feels he could be better if he stopped smoking. He had a PFT shows FEV1 62%. Denies any cardiac issues. He does have prostate cancer. He has seen a pulmonalogy in [place].  ROS  General: (-) fever, (-) chills, (-) night sweats, (-) weakness Skin: (-) rashes, (-) itching,. Eyes: (-) visual changes, (-) redness, (-) itching. Nose and Sinuses: (-) nasal stuffiness or itchiness, (-) postnasal drip, (-) nosebleeds, (-) sinus trouble. Mouth and Throat: (-) sore throat, (-) hoarseness. Neck: (-) swollen glands, (-) enlarged thyroid, (-) neck pain. Respiratory: + cough, (-) bloody sputum, + shortness of breath, - wheezing. Cardiovascular: - ankle swelling, (-) chest pain. Lymphatic: (-) lymph node enlargement. Neurologic: (-) numbness, (-) tingling. Psychiatric: (-) anxiety, (-) depression   Current Medication: Outpatient Encounter Medications as of 03/16/2021  Medication Sig   allopurinol (ZYLOPRIM) 100 MG tablet Take 2 tab po daily   azithromycin (ZITHROMAX) 250 MG tablet Take 1 tablet (250 mg total) by mouth daily. Take first 2 tablets together, then 1 every day until finished.   benzonatate (TESSALON) 100 MG capsule Take 1 capsule (100 mg total) by mouth every 8 (eight) hours.   buPROPion (WELLBUTRIN XL) 150 MG 24 hr tablet Take 1 tablet (150 mg  total) by mouth daily.   busPIRone (BUSPAR) 10 MG tablet Take 1 tablet (10 mg total) by mouth 2 (two) times daily.   fluticasone-salmeterol (ADVAIR) 100-50 MCG/ACT AEPB Inhale 1 puff into the lungs 2 (two) times daily. Rinse mouth after use.   ibuprofen (ADVIL) 800 MG tablet Take 800 mg by mouth 2 (two) times daily as needed.   meloxicam (MOBIC) 7.5 MG tablet TAKE 1 TABLET(7.5 MG) BY MOUTH DAILY   ondansetron (ZOFRAN ODT) 4 MG disintegrating tablet Take 1 tablet (4 mg total) by mouth every 8 (eight) hours as needed for nausea or vomiting.   sildenafil (VIAGRA) 100 MG tablet Take 1 tablet (100 mg total) by mouth daily as needed for erectile dysfunction.   No facility-administered encounter medications on file as of 03/16/2021.    Surgical History: History reviewed. No pertinent surgical history.  Medical History: Past Medical History:  Diagnosis Date   Arthritis    COPD (chronic obstructive pulmonary disease) ([place])    Depression    Emphysema of lung (HCC)    Gout    left knee    Family History: Family History  Problem Relation Age of Onset   Alcohol abuse Mother    Alcohol abuse Father    Alcohol abuse Brother     Social History: Social History   Socioeconomic History   Marital status: Single    Spouse name: Not on file   Number of children: Not on file   Years of education: Not on file   Highest education level: Not on file  Occupational History  Not on file  Tobacco Use   Smoking status: Every Day    Packs/day: 2.00    Years: 52.00    Pack years: 104.00    Types: Cigarettes   Smokeless tobacco: Never   Tobacco comments:    10 a day currently  Substance and Sexual Activity   Alcohol use: Not Currently   Drug use: Never   Sexual activity: Not on file  Other Topics Concern   Not on file  Social History Narrative   Not on file   Social Determinants of Health   Financial Resource Strain: Not on file  Food Insecurity: Not on file  Transportation Needs: Not  on file  Physical Activity: Not on file  Stress: Not on file  Social Connections: Not on file  Intimate Partner Violence: Not on file    Vital Signs: Blood pressure 104/74, pulse 66, temperature 98.1 F (36.7 C), resp. rate 16, height 6\' 1"  (1.854 m), weight 212 lb 3.2 oz (96.3 kg), SpO2 97 %.  Examination: General Appearance: The patient is well-developed, well-nourished, and in no distress. Skin: Gross inspection of skin unremarkable. Head: normocephalic, no gross deformities. Eyes: no gross deformities noted. ENT: ears appear grossly normal no exudates. Neck: Supple. No thyromegaly. No LAD. Respiratory: no rhonchi noted at this time. Cardiovascular: Normal S1 and S2 without murmur or rub. Extremities: No cyanosis. pulses are equal. Neurologic: Alert and oriented. No involuntary movements.  LABS: Recent Results (from the past 2160 hour(s))  UA/M w/rflx Culture, Routine     Status: Abnormal   Collection Time: 01/27/21 11:50 AM   Specimen: Urine   Urine  Result Value Ref Range   Specific Gravity, UA 1.020 1.005 - 1.030   pH, UA 5.5 5.0 - 7.5   Color, UA Yellow Yellow   Appearance Ur Clear Clear   Leukocytes,UA Trace (A) Negative   Protein,UA Negative Negative/Trace   Glucose, UA Negative Negative   Ketones, UA Negative Negative   RBC, UA Negative Negative   Bilirubin, UA Negative Negative   Urobilinogen, Ur 0.2 0.2 - 1.0 mg/dL   Nitrite, UA Negative Negative   Microscopic Examination See below:     Comment: Microscopic was indicated and was performed.   Urinalysis Reflex Comment     Comment: This specimen has reflexed to a Urine Culture.  Microscopic Examination     Status: Abnormal   Collection Time: 01/27/21 11:50 AM   Urine  Result Value Ref Range   WBC, UA 0-5 0 - 5 /hpf   RBC 3-10 (A) 0 - 2 /hpf   Epithelial Cells (non renal) None seen 0 - 10 /hpf   Casts None seen None seen /lpf   Bacteria, UA None seen None seen/Few  Urine Culture, Reflex     Status: None    Collection Time: 01/27/21 11:50 AM   Urine  Result Value Ref Range   Urine Culture, Routine Final report    Organism ID, Bacteria No growth     Radiology: CT CHEST LUNG CANCER SCREENING LOW DOSE WO CONTRAST  Result Date: 03/04/2021 CLINICAL DATA:  65 year old male with 104 pack-year history of smoking. Lung cancer screening. EXAM: CT CHEST WITHOUT CONTRAST LOW-DOSE FOR LUNG CANCER SCREENING TECHNIQUE: Multidetector CT imaging of the chest was performed following the standard protocol without IV contrast. COMPARISON:  None. FINDINGS: Cardiovascular: The heart size is normal. No substantial pericardial effusion. Coronary artery calcification is evident. Atherosclerotic calcification is noted in the wall of the thoracic aorta. Mediastinum/Nodes: No mediastinal  lymphadenopathy. No evidence for gross hilar lymphadenopathy although assessment is limited by the lack of intravenous contrast on today's study. The esophagus has normal imaging features. 10 mm short axis low paraesophageal node on 50/2 is upper normal for size. There is no axillary lymphadenopathy. Lungs/Pleura: Centrilobular emphsyema noted. Tiny pulmonary nodules identified measuring up to 4.6 mm in the posterior left upper lobe (image 161). No overtly suspicious pulmonary nodule or mass. No focal airspace consolidation. No pleural effusion. Upper Abdomen: Unremarkable. Musculoskeletal: No worrisome lytic or sclerotic osseous abnormality. IMPRESSION: 1. Lung-RADS 2, benign appearance or behavior. Continue annual screening with low-dose chest CT without contrast in 12 months. 2.  Emphysema (ICD10-J43.9) and Aortic Atherosclerosis (ICD10-170.0) Electronically Signed   By: Misty Stanley M.D.   On: 03/04/2021 16:42    No results found.  CT CHEST LUNG CANCER SCREENING LOW DOSE WO CONTRAST  Result Date: 03/04/2021 CLINICAL DATA:  65 year old male with 104 pack-year history of smoking. Lung cancer screening. EXAM: CT CHEST WITHOUT CONTRAST  LOW-DOSE FOR LUNG CANCER SCREENING TECHNIQUE: Multidetector CT imaging of the chest was performed following the standard protocol without IV contrast. COMPARISON:  None. FINDINGS: Cardiovascular: The heart size is normal. No substantial pericardial effusion. Coronary artery calcification is evident. Atherosclerotic calcification is noted in the wall of the thoracic aorta. Mediastinum/Nodes: No mediastinal lymphadenopathy. No evidence for gross hilar lymphadenopathy although assessment is limited by the lack of intravenous contrast on today's study. The esophagus has normal imaging features. 10 mm short axis low paraesophageal node on 50/2 is upper normal for size. There is no axillary lymphadenopathy. Lungs/Pleura: Centrilobular emphsyema noted. Tiny pulmonary nodules identified measuring up to 4.6 mm in the posterior left upper lobe (image 161). No overtly suspicious pulmonary nodule or mass. No focal airspace consolidation. No pleural effusion. Upper Abdomen: Unremarkable. Musculoskeletal: No worrisome lytic or sclerotic osseous abnormality. IMPRESSION: 1. Lung-RADS 2, benign appearance or behavior. Continue annual screening with low-dose chest CT without contrast in 12 months. 2.  Emphysema (ICD10-J43.9) and Aortic Atherosclerosis (ICD10-170.0) Electronically Signed   By: Misty Stanley M.D.   On: 03/04/2021 16:42      Assessment and Plan: Patient Active Problem List   Diagnosis Date Noted   Elevated PSA 12/26/2019   Benign prostatic hyperplasia with urinary frequency 12/26/2019    1. SOB (shortness of breath) He has moderate COPD based on his PFT. He is on single inhaler with advair. He may benefit from anticholinergic and so will change to trelegy. In Addition STOP smoking  2. Chronic obstructive pulmonary disease, unspecified COPD type ([place]) Moderate disease patient has been on advair. Change to trelegy.   3. Cigarette nicotine dependence with nicotine-induced disorder Smoking cessation  discussed at length. He did not do well with chantix in the past. Due to night mares. Need to work on other methods to stop smoking   4. Alcohol recovery Supportive care  General Counseling: I have discussed the findings of the evaluation and examination with [facility].  I have also discussed any further diagnostic evaluation thatmay be needed or ordered today. Mannix verbalizes understanding of the findings of todays visit. We also reviewed his medications today and discussed drug interactions and side effects including but not limited excessive drowsiness and altered mental states. We also discussed that there is always a risk not just to him but also people around him. he has been encouraged to call the office with any questions or concerns that should arise related to todays visit.  No orders of the defined  types were placed in this encounter.    Time spent: 16  I have personally obtained a history, examined the patient, evaluated laboratory and imaging results, formulated the assessment and plan and placed orders.    Allyne Gee, MD [facility] Pulmonary and Critical Care Sleep medicine

## 2021-03-16 NOTE — Patient Instructions (Signed)

## 2021-03-17 DIAGNOSIS — M1712 Unilateral primary osteoarthritis, left knee: Secondary | ICD-10-CM | POA: Diagnosis not present

## 2021-03-19 ENCOUNTER — Telehealth (INDEPENDENT_AMBULATORY_CARE_PROVIDER_SITE_OTHER): Payer: Medicare Other | Admitting: Gastroenterology

## 2021-03-19 DIAGNOSIS — Z86010 Personal history of colonic polyps: Secondary | ICD-10-CM

## 2021-03-19 MED ORDER — PEG 3350-KCL-NA BICARB-NACL 420 G PO SOLR: 4000.0000 mL | Freq: Once | ORAL | 0 refills | Status: AC

## 2021-03-19 NOTE — Progress Notes (Signed)
Gastroenterology Pre-Procedure Review  Request Date: 04/07/21 Requesting Physician: Dr. Bonna Gains  PATIENT REVIEW QUESTIONS: The patient responded to the following health history questions as indicated:    1. Are you having any GI issues? no 2. Do you have a personal history of Polyps? yes (2017) 3. Do you have a family history of Colon Cancer or Polyps? no 4. Diabetes Mellitus? no 5. Joint replacements in the past 12 months?no 6. Major health problems in the past 3 months?no 7. Any artificial heart valves, MVP, or defibrillator?no    MEDICATIONS & ALLERGIES:    Patient reports the following regarding taking any anticoagulation/antiplatelet therapy:   Plavix, Coumadin, Eliquis, Xarelto, Lovenox, Pradaxa, Brilinta, or Effient? no Aspirin? no  Patient confirms/reports the following medications:  Current Outpatient Medications  Medication Sig Dispense Refill   polyethylene glycol-electrolytes (NULYTELY) 420 g solution Take 4,000 mLs by mouth once for 1 dose. 4000 mL 0   albuterol (VENTOLIN HFA) 108 (90 Base) MCG/ACT inhaler Inhale 2 puffs into the lungs every 6 (six) hours as needed for wheezing or shortness of breath. 8 g 2   allopurinol (ZYLOPRIM) 100 MG tablet Take 2 tab po daily 60 tablet 3   azithromycin (ZITHROMAX) 250 MG tablet Take 1 tablet (250 mg total) by mouth daily. Take first 2 tablets together, then 1 every day until finished. 6 tablet 0   benzonatate (TESSALON) 100 MG capsule Take 1 capsule (100 mg total) by mouth every 8 (eight) hours. 21 capsule 0   buPROPion (WELLBUTRIN XL) 150 MG 24 hr tablet Take 1 tablet (150 mg total) by mouth daily. 90 tablet 1   busPIRone (BUSPAR) 10 MG tablet Take 1 tablet (10 mg total) by mouth 2 (two) times daily. 60 tablet 1   Fluticasone-Umeclidin-Vilant (TRELEGY ELLIPTA) 100-62.5-25 MCG/INH AEPB Inhale 1 puff into the lungs daily. 1 each 11   ibuprofen (ADVIL) 800 MG tablet Take 800 mg by mouth 2 (two) times daily as needed.     meloxicam  (MOBIC) 7.5 MG tablet TAKE 1 TABLET(7.5 MG) BY MOUTH DAILY 30 tablet 3   ondansetron (ZOFRAN ODT) 4 MG disintegrating tablet Take 1 tablet (4 mg total) by mouth every 8 (eight) hours as needed for nausea or vomiting. 20 tablet 0   sildenafil (VIAGRA) 100 MG tablet Take 1 tablet (100 mg total) by mouth daily as needed for erectile dysfunction. 10 tablet 0   No current facility-administered medications for this visit.    Patient confirms/reports the following allergies:  No Known Allergies  Orders Placed This Encounter  Procedures   Procedural/ Surgical Case Request: COLONOSCOPY WITH PROPOFOL    Standing Status:   Standing    Number of Occurrences:   1    Order Specific Question:   Pre-op diagnosis    Answer:   History of colonic polyps Z86.010    Order Specific Question:   CPT Code    Answer:   37048    AUTHORIZATION INFORMATION Primary Insurance: 1D#: Group #:  Secondary Insurance: 1D#: Group #:  SCHEDULE INFORMATION: Date: 04/07/21 Time: Location: [place]

## 2021-03-24 DIAGNOSIS — M1712 Unilateral primary osteoarthritis, left knee: Secondary | ICD-10-CM | POA: Diagnosis not present

## 2021-03-30 ENCOUNTER — Ambulatory Visit (INDEPENDENT_AMBULATORY_CARE_PROVIDER_SITE_OTHER): Payer: Medicare Other | Admitting: Nurse Practitioner

## 2021-03-30 ENCOUNTER — Telehealth: Payer: Self-pay

## 2021-03-30 ENCOUNTER — Encounter: Payer: Self-pay | Admitting: Nurse Practitioner

## 2021-03-30 ENCOUNTER — Other Ambulatory Visit: Payer: Self-pay

## 2021-03-30 VITALS — BP 117/73 | HR 60 | Temp 97.9°F | Resp 16 | Ht 72.0 in | Wt 215.0 lb

## 2021-03-30 DIAGNOSIS — F17219 Nicotine dependence, cigarettes, with unspecified nicotine-induced disorders: Secondary | ICD-10-CM

## 2021-03-30 DIAGNOSIS — J432 Centrilobular emphysema: Secondary | ICD-10-CM | POA: Diagnosis not present

## 2021-03-30 DIAGNOSIS — I700 Atherosclerosis of aorta: Secondary | ICD-10-CM | POA: Diagnosis not present

## 2021-03-30 DIAGNOSIS — F320 Major depressive disorder, single episode, mild: Secondary | ICD-10-CM

## 2021-03-30 DIAGNOSIS — J449 Chronic obstructive pulmonary disease, unspecified: Secondary | ICD-10-CM

## 2021-03-30 MED ORDER — VARENICLINE TARTRATE 0.5 MG X 11 & 1 MG X 42 PO MISC: ORAL | 1 refills | Status: DC

## 2021-03-30 NOTE — Progress Notes (Signed)
[facility] [place], [place] [address]  Internal MEDICINE  Office Visit Note  Patient Name: [name] [name]  [id number]  [id number]  Date of Service: 03/30/2021  Chief Complaint  Patient presents with  . Follow-up    Med review  . Arthritis  . COPD  . Depression  . Gout    HPI [name] [name] presents for a 2 month follow up visit. His previous office visit was in May 2022 for his annual wellness visit. He presents today to review his medication. His medical history is significant for arthritis, COPD, depression and gout. He is doing well per patient report. He has been getting gel shots in his left knee which he reports has been helping. He has 1 injection left.  -current every day smoker. He is ready to quit. He has smoked as much as 2ppd for 52 years. He had found some unused Chantix at home from a previous attempt to quit and started taking it. He is now down to 10-14 cigarettes per day which is less than 1 ppd. He is requesting a new prescription for Chantix.  -low dose CT chest was done for lung cancer screening. Tiny pulmonary nodules measuring up to 4.6 mm in the posterior left upper lobe. Centrilobular emphysema noted. Aortic atherosclerosis also identified on CT scan. Lung-RADS 2 benign appearance or behavior. Repeat screening in 12 months.  Screening colonoscopy scheduled for 04/07/21.     Current Medication: Outpatient Encounter Medications as of 03/30/2021  Medication Sig  . albuterol (VENTOLIN HFA) 108 (90 Base) MCG/ACT inhaler Inhale 2 puffs into the lungs every 6 (six) hours as needed for wheezing or shortness of breath.  . allopurinol (ZYLOPRIM) 100 MG tablet Take 2 tab po daily  . azithromycin (ZITHROMAX) 250 MG tablet Take 1 tablet (250 mg total) by mouth daily. Take first 2 tablets together, then 1 every day until finished.  . benzonatate (TESSALON) 100 MG capsule Take 1 capsule (100 mg total) by mouth every 8 (eight) hours.  Marland Kitchen buPROPion  (WELLBUTRIN XL) 150 MG 24 hr tablet Take 1 tablet (150 mg total) by mouth daily.  . busPIRone (BUSPAR) 10 MG tablet Take 1 tablet (10 mg total) by mouth 2 (two) times daily.  . Fluticasone-Umeclidin-Vilant (TRELEGY ELLIPTA) 100-62.5-25 MCG/INH AEPB Inhale 1 puff into the lungs daily.  Marland Kitchen ibuprofen (ADVIL) 800 MG tablet Take 800 mg by mouth 2 (two) times daily as needed.  . meloxicam (MOBIC) 7.5 MG tablet TAKE 1 TABLET(7.5 MG) BY MOUTH DAILY  . ondansetron (ZOFRAN ODT) 4 MG disintegrating tablet Take 1 tablet (4 mg total) by mouth every 8 (eight) hours as needed for nausea or vomiting.  . sildenafil (VIAGRA) 100 MG tablet Take 1 tablet (100 mg total) by mouth daily as needed for erectile dysfunction.  . varenicline (CHANTIX PAK) 0.5 MG X 11 & 1 MG X 42 tablet Take one 0.5 mg tablet by mouth once daily for 3 days, then increase to one 0.5 mg tablet twice daily for 4 days, then increase to one 1 mg tablet twice daily.   No facility-administered encounter medications on file as of 03/30/2021.    Surgical History: History reviewed. No pertinent surgical history.  Medical History: Past Medical History:  Diagnosis Date  . Arthritis   . COPD (chronic obstructive pulmonary disease) ([place])   . Depression   . Emphysema of lung ([place])   . Gout    left knee    Family History: Family History  Problem Relation Age  of Onset  . Alcohol abuse Mother   . Alcohol abuse Father   . Alcohol abuse Brother     Social History   Socioeconomic History  . Marital status: Single    Spouse name: Not on file  . Number of children: Not on file  . Years of education: Not on file  . Highest education level: Not on file  Occupational History  . Not on file  Tobacco Use  . Smoking status: Every Day    Packs/day: 2.00    Years: 52.00    Pack years: 104.00    Types: Cigarettes  . Smokeless tobacco: Never  . Tobacco comments:    10 a day currently  Substance and Sexual Activity  . Alcohol use: Not  Currently  . Drug use: Never  . Sexual activity: Not on file  Other Topics Concern  . Not on file  Social History Narrative  . Not on file   Social Determinants of Health   Financial Resource Strain: Not on file  Food Insecurity: Not on file  Transportation Needs: Not on file  Physical Activity: Not on file  Stress: Not on file  Social Connections: Not on file  Intimate Partner Violence: Not on file      Review of Systems  Constitutional:  Negative for chills, fatigue and unexpected weight change.  HENT:  Negative for congestion, rhinorrhea, sneezing and sore throat.   Eyes:  Negative for redness.  Respiratory:  Negative for cough, chest tightness and shortness of breath.   Cardiovascular:  Negative for chest pain and palpitations.  Gastrointestinal:  Negative for abdominal pain, constipation, diarrhea, nausea and vomiting.  Genitourinary:  Negative for dysuria and frequency.  Musculoskeletal:  Negative for arthralgias, back pain, joint swelling and neck pain.  Skin:  Negative for rash.  Neurological: Negative.  Negative for tremors and numbness.  Hematological:  Negative for adenopathy. Does not bruise/bleed easily.  Psychiatric/Behavioral:  Negative for behavioral problems (Depression), sleep disturbance and suicidal ideas. The patient is not nervous/anxious.    Vital Signs: BP 117/73   Pulse 60   Temp 97.9 F (36.6 C)   Resp 16   Ht 6' (1.829 m)   Wt 215 lb (97.5 kg)   SpO2 96%   BMI 29.16 kg/m    Physical Exam Vitals reviewed.  Constitutional:      General: He is not in acute distress.    Appearance: Normal appearance. He is well-developed and normal weight. He is not ill-appearing or diaphoretic.  HENT:     Head: Normocephalic and atraumatic.  Neck:     Thyroid: No thyromegaly.     Vascular: No JVD.     Trachea: No tracheal deviation.  Cardiovascular:     Rate and Rhythm: Normal rate and regular rhythm.     Pulses: Normal pulses.     Heart sounds:  Normal heart sounds. No murmur heard.   No friction rub. No gallop.  Pulmonary:     Effort: Pulmonary effort is normal. No respiratory distress.     Breath sounds: Normal breath sounds. No wheezing or rales.  Chest:     Chest wall: No tenderness.  Skin:    General: Skin is warm and dry.     Capillary Refill: Capillary refill takes less than 2 seconds.  Neurological:     Mental Status: He is alert and oriented to person, place, and time.  Psychiatric:        Mood and Affect: Mood normal.  Behavior: Behavior normal.    Assessment/Plan: 1. Centrilobular emphysema (HCC) Stable, taking trelegy ellipta which the patient reports improves his breathing significantly. He is in the process of quitting smoking which will also help his breathing.  2. Aortic atherosclerosis ([place]) Identified on his CT chest. He has an echocardiogram done in march 2022. Both atria are slightly dilated, the aortic root is slightly dilated and there is mild aortic regurgitation. His LVEF is 76%. Repeat echo recommended in 1 year to monitor aortic root dilation. Jakylan has not had bilateral carotid ultrasound to assess for carotid stenosis. Ultrasound ordered to be done at [facility], will follow up with ultrasound results.  - US Carotid Duplex Bilateral; Future  3. Cigarette nicotine dependence with nicotine-induced disorder Starlin has been taking Chantix, prescription sent to pharmacy at his request. He reports being determined to quit smoking but understands that he needs help to do it. Will follow up in 2 months to assess progress with smoking cessation and the need for further chantix prescription.  Smoking cessation counseling: Pt acknowledges the risks of long term smoking, he will try to quite smoking. Options for different medications including nicotine products, chewing gum, patch etc, Wellbutrin and Chantix is discussed Goal and date of complete cessation is discussed Total time spent in smoking cessation is 15  min. - varenicline (CHANTIX PAK) 0.5 MG X 11 & 1 MG X 42 tablet; Take one 0.5 mg tablet by mouth once daily for 3 days, then increase to one 0.5 mg tablet twice daily for 4 days, then increase to one 1 mg tablet twice daily.  Dispense: 53 tablet; Refill: 1  4. Depression, major, single episode, mild (HCC) Stable with current medication. No changes.    General Counseling: Jung verbalizes understanding of the findings of todays visit and agrees with plan of treatment. I have discussed any further diagnostic evaluation that may be needed or ordered today. We also reviewed his medications today. he has been encouraged to call the office with any questions or concerns that should arise related to todays visit.    No orders of the defined types were placed in this encounter.   Meds ordered this encounter  Medications  . varenicline (CHANTIX PAK) 0.5 MG X 11 & 1 MG X 42 tablet    Sig: Take one 0.5 mg tablet by mouth once daily for 3 days, then increase to one 0.5 mg tablet twice daily for 4 days, then increase to one 1 mg tablet twice daily.    Dispense:  53 tablet    Refill:  1    Return in about 2 months (around 05/31/2021) for F/U smoking cessation and chantix, Latoya Maulding PCP.   Total time spent:30 Minutes Time spent includes review of chart, medications, test results, and follow up plan with the patient.   [place] Controlled Substance Database was reviewed by me.  This patient was seen by Jonetta Osgood, FNP-C in collaboration with Dr. Clayborn Bigness as a part of collaborative care agreement.   Haeven Nickle R. Valetta Fuller, MSN, FNP-C Internal medicine

## 2021-03-30 NOTE — Telephone Encounter (Signed)
Lmom that pt had gaps for shingles vaccine and pneumovax vaccine in past or he like [place] to send pres to phar

## 2021-03-31 DIAGNOSIS — M1712 Unilateral primary osteoarthritis, left knee: Secondary | ICD-10-CM | POA: Diagnosis not present

## 2021-04-07 ENCOUNTER — Ambulatory Visit: Payer: Medicare Other | Admitting: Certified Registered Nurse Anesthetist

## 2021-04-07 ENCOUNTER — Ambulatory Visit
Admission: RE | Admit: 2021-04-07 | Discharge: 2021-04-07 | Disposition: A | Discharge status: Home / Self Care | Payer: Medicare Other | Attending: Gastroenterology | Admitting: Gastroenterology

## 2021-04-07 ENCOUNTER — Encounter
Admission: RE | Disposition: A | Discharge status: Home / Self Care | Payer: Self-pay | Source: Home / Self Care | Attending: Gastroenterology

## 2021-04-07 DIAGNOSIS — F17210 Nicotine dependence, cigarettes, uncomplicated: Secondary | ICD-10-CM | POA: Insufficient documentation

## 2021-04-07 DIAGNOSIS — K621 Rectal polyp: Secondary | ICD-10-CM | POA: Diagnosis not present

## 2021-04-07 DIAGNOSIS — D125 Benign neoplasm of sigmoid colon: Secondary | ICD-10-CM | POA: Diagnosis not present

## 2021-04-07 DIAGNOSIS — Z79899 Other long term (current) drug therapy: Secondary | ICD-10-CM | POA: Insufficient documentation

## 2021-04-07 DIAGNOSIS — Z86010 Personal history of colonic polyps: Secondary | ICD-10-CM

## 2021-04-07 DIAGNOSIS — K648 Other hemorrhoids: Secondary | ICD-10-CM | POA: Insufficient documentation

## 2021-04-07 DIAGNOSIS — K635 Polyp of colon: Secondary | ICD-10-CM

## 2021-04-07 DIAGNOSIS — Z791 Long term (current) use of non-steroidal anti-inflammatories (NSAID): Secondary | ICD-10-CM | POA: Insufficient documentation

## 2021-04-07 DIAGNOSIS — J432 Centrilobular emphysema: Secondary | ICD-10-CM | POA: Insufficient documentation

## 2021-04-07 DIAGNOSIS — Z1211 Encounter for screening for malignant neoplasm of colon: Secondary | ICD-10-CM | POA: Insufficient documentation

## 2021-04-07 DIAGNOSIS — J439 Emphysema, unspecified: Secondary | ICD-10-CM | POA: Diagnosis not present

## 2021-04-07 DIAGNOSIS — Z7951 Long term (current) use of inhaled steroids: Secondary | ICD-10-CM | POA: Insufficient documentation

## 2021-04-07 DIAGNOSIS — F320 Major depressive disorder, single episode, mild: Secondary | ICD-10-CM | POA: Insufficient documentation

## 2021-04-07 DIAGNOSIS — F17219 Nicotine dependence, cigarettes, with unspecified nicotine-induced disorders: Secondary | ICD-10-CM | POA: Insufficient documentation

## 2021-04-07 HISTORY — PX: COLONOSCOPY WITH PROPOFOL: SHX5780

## 2021-04-07 SURGERY — COLONOSCOPY WITH PROPOFOL
Anesthesia: General

## 2021-04-07 MED ORDER — SODIUM CHLORIDE 0.9 % IV SOLN
INTRAVENOUS | Status: DC
  Administered 2021-04-07: 1000 mL via INTRAVENOUS

## 2021-04-07 MED ORDER — LIDOCAINE HCL (PF) 2 % IJ SOLN
INTRAMUSCULAR | Status: AC
  Filled 2021-04-07: qty 5

## 2021-04-07 MED ORDER — LIDOCAINE HCL (CARDIAC) PF 100 MG/5ML IV SOSY
PREFILLED_SYRINGE | INTRAVENOUS | Status: DC | PRN
  Administered 2021-04-07: 50 mg via INTRAVENOUS

## 2021-04-07 MED ORDER — PROPOFOL 500 MG/50ML IV EMUL
INTRAVENOUS | Status: DC | PRN
  Administered 2021-04-07: 160 ug/kg/min via INTRAVENOUS

## 2021-04-07 MED ORDER — LIDOCAINE HCL (PF) 1 % IJ SOLN
INTRAMUSCULAR | Status: AC
  Filled 2021-04-07: qty 2

## 2021-04-07 MED ORDER — PROPOFOL 10 MG/ML IV BOLUS
INTRAVENOUS | Status: DC | PRN
  Administered 2021-04-07: 60 mg via INTRAVENOUS

## 2021-04-07 MED ORDER — PROPOFOL 500 MG/50ML IV EMUL
INTRAVENOUS | Status: AC
  Filled 2021-04-07: qty 50

## 2021-04-07 NOTE — Transfer of Care (Signed)
Immediate Anesthesia Transfer of Care Note  Patient: [name] [name]  Procedure(s) Performed: COLONOSCOPY WITH PROPOFOL  Patient Location: Endoscopy Unit  Anesthesia Type:General  Level of Consciousness: drowsy  Airway & Oxygen Therapy: Patient Spontanous Breathing  Post-op Assessment: Report given to RN and Post -op Vital signs reviewed and stable  Post vital signs: Reviewed and stable  Last Vitals:  Vitals Value Taken Time  BP    Temp    Pulse 76 04/07/21 0939  Resp 22 04/07/21 0939  SpO2 94 % 04/07/21 0939  Vitals shown include unvalidated device data.  Last Pain:  Vitals:   04/07/21 0834  PainSc: 0-No pain         Complications: No notable events documented.

## 2021-04-07 NOTE — Anesthesia Preprocedure Evaluation (Signed)
Anesthesia Evaluation  Patient identified by MRN, date of birth, ID band Patient awake    Reviewed: Allergy & Precautions, NPO status , Patient's Chart, lab work & pertinent test results  Airway Mallampati: II  TM Distance: >3 FB Neck ROM: Full    Dental  (+) Edentulous Upper, Edentulous Lower   Pulmonary COPD,  COPD inhaler, Current Smoker and Patient abstained from smoking.,    Pulmonary exam normal        Cardiovascular negative cardio ROS Normal cardiovascular exam     Neuro/Psych PSYCHIATRIC DISORDERS Depression negative neurological ROS     GI/Hepatic negative GI ROS, Neg liver ROS,   Endo/Other  negative endocrine ROS  Renal/GU negative Renal ROS  negative genitourinary   Musculoskeletal  (+) Arthritis , Osteoarthritis,    Abdominal   Peds negative pediatric ROS (+)  Hematology negative hematology ROS (+)   Anesthesia Other Findings Arthritis    COPD (chronic obstructive pulmonary disease) (HCC)    Depression    Emphysema of lung (HCC)   Gout       Reproductive/Obstetrics negative OB ROS                            Anesthesia Physical Anesthesia Plan  ASA: 3  Anesthesia Plan: General   Post-op Pain Management:    Induction: Intravenous  PONV Risk Score and Plan: 2 and Propofol infusion and TIVA  Airway Management Planned: Natural Airway and Nasal Cannula  Additional Equipment:   Intra-op Plan:   Post-operative Plan:   Informed Consent: I have reviewed the patients History and Physical, chart, labs and discussed the procedure including the risks, benefits and alternatives for the proposed anesthesia with the patient or authorized representative who has indicated his/her understanding and acceptance.       Plan Discussed with: CRNA, Anesthesiologist and Surgeon  Anesthesia Plan Comments:         Anesthesia Quick Evaluation

## 2021-04-07 NOTE — H&P (Signed)
Vonda Antigua, MD [address] [place], [place], [place], [address] [address], [place], [place], [place], [address] Phone: [phone]  Fax: [phone]  Primary Care Physician:  Jonetta Osgood, NP   Pre-Procedure History & Physical: HPI:  [name] [name] is a 65 y.o. male is here for a colonoscopy.   Past Medical History:  Diagnosis Date   Arthritis    COPD (chronic obstructive pulmonary disease) ([place])    Depression    Emphysema of lung ([place])    Gout    left knee    No past surgical history on file.  Prior to Admission medications   Medication Sig Start Date End Date Taking? Authorizing Provider  albuterol (VENTOLIN HFA) 108 (90 Base) MCG/ACT inhaler Inhale 2 puffs into the lungs every 6 (six) hours as needed for wheezing or shortness of breath. 03/16/21  Yes Devona Konig A, MD  allopurinol (ZYLOPRIM) 100 MG tablet Take 2 tab po daily 11/26/20   Luiz Ochoa, NP  azithromycin (ZITHROMAX) 250 MG tablet Take 1 tablet (250 mg total) by mouth daily. Take first 2 tablets together, then 1 every day until finished. 12/15/20   Loura Halt A, NP  benzonatate (TESSALON) 100 MG capsule Take 1 capsule (100 mg total) by mouth every 8 (eight) hours. 12/15/20   Loura Halt A, NP  buPROPion (WELLBUTRIN XL) 150 MG 24 hr tablet Take 1 tablet (150 mg total) by mouth daily. 01/27/21   Lavera Guise, MD  busPIRone (BUSPAR) 10 MG tablet Take 1 tablet (10 mg total) by mouth 2 (two) times daily. 11/28/20   Luiz Ochoa, NP  Fluticasone-Umeclidin-Vilant (TRELEGY ELLIPTA) 100-62.5-25 MCG/INH AEPB Inhale 1 puff into the lungs daily. 03/16/21   Allyne Gee, MD  ibuprofen (ADVIL) 800 MG tablet Take 800 mg by mouth 2 (two) times daily as needed.    [provider]  meloxicam (MOBIC) 7.5 MG tablet TAKE 1 TABLET(7.5 MG) BY MOUTH DAILY 12/10/20   Luiz Ochoa, NP  ondansetron (ZOFRAN ODT) 4 MG disintegrating tablet Take 1 tablet (4 mg total) by mouth every 8 (eight) hours as  needed for nausea or vomiting. 11/18/20   Luiz Ochoa, NP  sildenafil (VIAGRA) 100 MG tablet Take 1 tablet (100 mg total) by mouth daily as needed for erectile dysfunction. 11/28/20   Luiz Ochoa, NP  varenicline (CHANTIX PAK) 0.5 MG X 11 & 1 MG X 42 tablet Take one 0.5 mg tablet by mouth once daily for 3 days, then increase to one 0.5 mg tablet twice daily for 4 days, then increase to one 1 mg tablet twice daily. 03/30/21   Jonetta Osgood, NP    Allergies as of 03/19/2021   (No Known Allergies)    Family History  Problem Relation Age of Onset   Alcohol abuse Mother    Alcohol abuse Father    Alcohol abuse Brother     Social History   Socioeconomic History   Marital status: Single    Spouse name: Not on file   Number of children: Not on file   Years of education: Not on file   Highest education level: Not on file  Occupational History   Not on file  Tobacco Use   Smoking status: Every Day    Packs/day: 2.00    Years: 52.00    Pack years: 104.00    Types: Cigarettes   Smokeless tobacco: Never   Tobacco comments:    10 a day currently  Substance and  Sexual Activity   Alcohol use: Not Currently   Drug use: Never   Sexual activity: Not on file  Other Topics Concern   Not on file  Social History Narrative   Not on file   Social Determinants of Health   Financial Resource Strain: Not on file  Food Insecurity: Not on file  Transportation Needs: Not on file  Physical Activity: Not on file  Stress: Not on file  Social Connections: Not on file  Intimate Partner Violence: Not on file    Review of Systems: See HPI, otherwise negative ROS  Physical Exam: Constitutional: General:   Alert,  Well-developed, well-nourished, pleasant and cooperative in NAD BP (!) 124/100   Pulse 69   Temp 98 F (36.7 C)   Resp 16   Ht 6' (1.829 m)   Wt 97.5 kg   SpO2 97%   BMI 29.16 kg/m   Head: Normocephalic, atraumatic.   Eyes:  Sclera clear, no icterus.    Conjunctiva pink.   Mouth:  No deformity or lesions, oropharynx pink & moist.  Neck:  Supple, trachea midline  Respiratory: Normal respiratory effort  Gastrointestinal:  Soft, non-tender and non-distended without masses, hepatosplenomegaly or hernias noted.  No guarding or rebound tenderness.     Cardiac: No clubbing or edema.  No cyanosis. Normal posterior tibial pedal pulses noted.  Lymphatic:  No significant cervical adenopathy.  Psych:  Alert and cooperative. Normal mood and affect.  Musculoskeletal:   Symmetrical without gross deformities. 5/5 Lower extremity strength bilaterally.  Skin: Warm. Intact without significant lesions or rashes. No jaundice.  Neurologic:  Face symmetrical, tongue midline, Normal sensation to touch;  grossly normal neurologically.  Psych:  Alert and oriented x3, Alert and cooperative. Normal mood and affect.  Impression/Plan: [name] [name] is here for a colonoscopy to be performed for history of polyps. Pt reports having two colonoscopies over his lifetime in [place]. Procedure or pathology report not available. States last colonoscopy was 4 years ago and remembers that there were clips placed at the time. States he was advised to have a repeat colonoscopy in 2 yrs after that. Reports having a normal colonoscopy before that.   Risks, benefits, limitations, and alternatives regarding  colonoscopy have been reviewed with the patient.  Questions have been answered.  All parties agreeable.   Virgel Manifold, MD  04/07/2021, 8:43 AM

## 2021-04-07 NOTE — Anesthesia Postprocedure Evaluation (Signed)
Anesthesia Post Note  Patient: [name] [name]  Procedure(s) Performed: COLONOSCOPY WITH PROPOFOL  Patient location during evaluation: Phase II Anesthesia Type: General Level of consciousness: awake and alert, awake and oriented Pain management: pain level controlled Vital Signs Assessment: post-procedure vital signs reviewed and stable Respiratory status: spontaneous breathing, nonlabored ventilation and respiratory function stable Cardiovascular status: blood pressure returned to baseline and stable Postop Assessment: no apparent nausea or vomiting Anesthetic complications: no   No notable events documented.   Last Vitals:  Vitals:   04/07/21 0834 04/07/21 0939  BP: (!) 124/100 104/70  Pulse: 69   Resp: 16   Temp: 36.7 C   SpO2: 97%     Last Pain:  Vitals:   04/07/21 0939  PainSc: 0-No pain                 Phill Mutter

## 2021-04-07 NOTE — Op Note (Signed)
[facility] Gastroenterology Patient Name: [name] [name] Procedure Date: 04/07/2021 8:24 AM MRN: [id number] Account #: [id number] Date of Birth: 07-Jul-1956 Admit Type: Outpatient Age: 65 Room: [facility] ENDO ROOM 3 Gender: Male Note Status: Finalized Procedure:             Colonoscopy Indications:           High risk colon cancer surveillance: Personal history                         of colonic polyps Providers:             Millianna Szymborski B. Bonna Gains MD, MD Referring MD:          Jonetta Osgood, NP Medicines:             Monitored Anesthesia Care Complications:         No immediate complications. Procedure:             Pre-Anesthesia Assessment:                        - ASA Grade Assessment: II - A patient with mild                         systemic disease.                        - Prior to the procedure, a History and Physical was                         performed, and patient medications, allergies and                         sensitivities were reviewed. The patient's tolerance                         of previous anesthesia was reviewed.                        - The risks and benefits of the procedure and the                         sedation options and risks were discussed with the                         patient. All questions were answered and informed                         consent was obtained.                        - Patient identification and proposed procedure were                         verified prior to the procedure by the physician, the                         nurse, the anesthesiologist, the anesthetist and the                         technician. The procedure was verified in  the                         procedure room.                        After obtaining informed consent, the colonoscope was                         passed under direct vision. Throughout the procedure,                         the patient's blood pressure, pulse, and oxygen                          saturations were monitored continuously. The                         Colonoscope was introduced through the anus and                         advanced to the the cecum, identified by appendiceal                         orifice and ileocecal valve. The colonoscopy was                         performed with ease. The patient tolerated the                         procedure well. The quality of the bowel preparation                         was good. Findings:      The perianal and digital rectal examinations were normal.      Two sessile polyps were found in the sigmoid colon. The polyps were 5 to       6 mm in size. These polyps were removed with a cold snare. Resection and       retrieval were complete.      A 4 mm polyp was found in the rectum. The polyp was flat. Imaging was       performed using narrow band imaging to visualize the mucosa. The polyp       was removed with a cold snare. Resection and retrieval were complete.      The exam was otherwise without abnormality.      The rectum, sigmoid colon, descending colon, transverse colon, ascending       colon and cecum appeared normal.      Non-bleeding internal hemorrhoids were found during retroflexion. Impression:            - Two 5 to 6 mm polyps in the sigmoid colon, removed                         with a cold snare. Resected and retrieved.                        - One 4 mm polyp in the rectum, removed with a cold  snare. Resected and retrieved.                        - The examination was otherwise normal.                        - The rectum, sigmoid colon, descending colon,                         transverse colon, ascending colon and cecum are normal.                        - Non-bleeding internal hemorrhoids. Recommendation:        - Discharge patient to home (with escort).                        - Repeat colonoscopy date to be determined after                         pending pathology  results are reviewed.                        - Records from previous procedures may need to be                         obtained to determine surveillance interval.                        - Advance diet as tolerated.                        - Continue present medications.                        - Await pathology results.                        - The findings and recommendations were discussed with                         the patient.                        - The findings and recommendations were discussed with                         the patient's family.                        - Return to primary care physician as previously                         scheduled.                        - High fiber diet. Procedure Code(s):     --- Professional ---                        [phone], Colonoscopy, flexible; with removal of  tumor(s), polyp(s), or other lesion(s) by snare                         technique Diagnosis Code(s):     --- Professional ---                        Z86.010, Personal history of colonic polyps                        K63.5, Polyp of colon                        K62.1, Rectal polyp CPT copyright 2019 American Medical Association. All rights reserved. The codes documented in this report are preliminary and upon coder review may  be revised to meet current compliance requirements.  Vonda Antigua, MD Margretta Sidle B. Bonna Gains MD, MD 04/07/2021 9:45:44 AM This report has been signed electronically. Number of Addenda: 0 Note Initiated On: 04/07/2021 8:24 AM Scope Withdrawal Time: 0 hours 26 minutes 10 seconds  Total Procedure Duration: 0 hours 32 minutes 29 seconds  Estimated Blood Loss:  Estimated blood loss: none.      [facility]

## 2021-04-08 ENCOUNTER — Encounter: Payer: Self-pay | Admitting: Gastroenterology

## 2021-04-08 LAB — SURGICAL PATHOLOGY

## 2021-04-09 ENCOUNTER — Encounter: Payer: Self-pay | Admitting: Gastroenterology

## 2021-04-22 ENCOUNTER — Other Ambulatory Visit: Payer: Self-pay

## 2021-04-22 MED ORDER — MELOXICAM 7.5 MG PO TABS: ORAL_TABLET | ORAL | 3 refills | Status: DC

## 2021-04-27 DIAGNOSIS — M1712 Unilateral primary osteoarthritis, left knee: Secondary | ICD-10-CM | POA: Diagnosis not present

## 2021-05-01 DIAGNOSIS — M1712 Unilateral primary osteoarthritis, left knee: Secondary | ICD-10-CM | POA: Diagnosis not present

## 2021-05-17 ENCOUNTER — Observation Stay
Admission: EM | Admit: 2021-05-17 | Discharge: 2021-05-19 | Disposition: A | Discharge status: Home / Self Care | Payer: Medicare Other | Attending: Internal Medicine | Admitting: Internal Medicine

## 2021-05-17 ENCOUNTER — Emergency Department: Payer: Medicare Other

## 2021-05-17 ENCOUNTER — Other Ambulatory Visit: Payer: Self-pay

## 2021-05-17 ENCOUNTER — Observation Stay (HOSPITAL_BASED_OUTPATIENT_CLINIC_OR_DEPARTMENT_OTHER)
Admit: 2021-05-17 | Discharge: 2021-05-17 | Disposition: A | Discharge status: Home / Self Care | Payer: Medicare Other | Attending: Internal Medicine | Admitting: Internal Medicine

## 2021-05-17 DIAGNOSIS — I214 Non-ST elevation (NSTEMI) myocardial infarction: Secondary | ICD-10-CM | POA: Diagnosis present

## 2021-05-17 DIAGNOSIS — F17210 Nicotine dependence, cigarettes, uncomplicated: Secondary | ICD-10-CM | POA: Diagnosis not present

## 2021-05-17 DIAGNOSIS — F17219 Nicotine dependence, cigarettes, with unspecified nicotine-induced disorders: Secondary | ICD-10-CM | POA: Diagnosis present

## 2021-05-17 DIAGNOSIS — Z79899 Other long term (current) drug therapy: Secondary | ICD-10-CM | POA: Insufficient documentation

## 2021-05-17 DIAGNOSIS — J449 Chronic obstructive pulmonary disease, unspecified: Secondary | ICD-10-CM | POA: Insufficient documentation

## 2021-05-17 DIAGNOSIS — Z860100 Personal history of colon polyps, unspecified: Secondary | ICD-10-CM

## 2021-05-17 DIAGNOSIS — F320 Major depressive disorder, single episode, mild: Secondary | ICD-10-CM | POA: Diagnosis present

## 2021-05-17 DIAGNOSIS — R0789 Other chest pain: Secondary | ICD-10-CM | POA: Diagnosis not present

## 2021-05-17 DIAGNOSIS — Z20822 Contact with and (suspected) exposure to covid-19: Secondary | ICD-10-CM | POA: Diagnosis not present

## 2021-05-17 DIAGNOSIS — K635 Polyp of colon: Secondary | ICD-10-CM | POA: Diagnosis present

## 2021-05-17 DIAGNOSIS — I25118 Atherosclerotic heart disease of native coronary artery with other forms of angina pectoris: Secondary | ICD-10-CM | POA: Diagnosis not present

## 2021-05-17 DIAGNOSIS — N3281 Overactive bladder: Secondary | ICD-10-CM | POA: Diagnosis not present

## 2021-05-17 DIAGNOSIS — R350 Frequency of micturition: Secondary | ICD-10-CM | POA: Diagnosis present

## 2021-05-17 DIAGNOSIS — I208 Other forms of angina pectoris: Secondary | ICD-10-CM

## 2021-05-17 DIAGNOSIS — R079 Chest pain, unspecified: Secondary | ICD-10-CM | POA: Diagnosis not present

## 2021-05-17 DIAGNOSIS — N401 Enlarged prostate with lower urinary tract symptoms: Secondary | ICD-10-CM | POA: Diagnosis present

## 2021-05-17 DIAGNOSIS — I200 Unstable angina: Secondary | ICD-10-CM | POA: Diagnosis not present

## 2021-05-17 DIAGNOSIS — I2510 Atherosclerotic heart disease of native coronary artery without angina pectoris: Secondary | ICD-10-CM

## 2021-05-17 DIAGNOSIS — J432 Centrilobular emphysema: Secondary | ICD-10-CM | POA: Diagnosis present

## 2021-05-17 LAB — CBC
HCT: 42.4 % (ref 39.0–52.0)
Hemoglobin: 14.6 g/dL (ref 13.0–17.0)
MCH: 30.7 pg (ref 26.0–34.0)
MCHC: 34.4 g/dL (ref 30.0–36.0)
MCV: 89.1 fL (ref 80.0–100.0)
Platelets: 173 10*3/uL (ref 150–400)
RBC: 4.76 MIL/uL (ref 4.22–5.81)
RDW: 13.2 % (ref 11.5–15.5)
WBC: 7 10*3/uL (ref 4.0–10.5)
nRBC: 0 % (ref 0.0–0.2)

## 2021-05-17 LAB — BASIC METABOLIC PANEL
Anion gap: 9 (ref 5–15)
BUN: 15 mg/dL (ref 8–23)
CO2: 24 mmol/L (ref 22–32)
Calcium: 9.3 mg/dL (ref 8.9–10.3)
Chloride: 106 mmol/L (ref 98–111)
Creatinine, Ser: 1.24 mg/dL (ref 0.61–1.24)
GFR, Estimated: >60 mL/min (ref >60)
Glucose, Bld: 112 mg/dL — ABNORMAL HIGH (ref 70–99)
Potassium: 4.3 mmol/L (ref 3.5–5.1)
Sodium: 139 mmol/L (ref 135–145)

## 2021-05-17 LAB — RESP PANEL BY RT-PCR (FLU A&B, COVID) ARPGX2
Influenza A by PCR: NEGATIVE
Influenza B by PCR: NEGATIVE
SARS Coronavirus 2 by RT PCR: NEGATIVE

## 2021-05-17 LAB — ECHOCARDIOGRAM COMPLETE
AR max vel: 2.68 cm2
AV Area VTI: 3.09 cm2
AV Area mean vel: 2.89 cm2
AV Mean grad: 3 mmHg
AV Peak grad: 6.4 mmHg
Ao pk vel: 1.26 m/s
Area-P 1/2: 2.5 cm2
Height: 73 in
S' Lateral: 4.17 cm
Weight: 3440 oz

## 2021-05-17 LAB — VITAMIN B12: Vitamin B-12: 3335 pg/mL — ABNORMAL HIGH (ref 180–914)

## 2021-05-17 LAB — APTT: aPTT: 120 seconds — ABNORMAL HIGH (ref 24–36)

## 2021-05-17 LAB — URINE DRUG SCREEN, QUALITATIVE (ARMC ONLY)
Amphetamines, Ur Screen: NOT DETECTED
Barbiturates, Ur Screen: NOT DETECTED
Benzodiazepine, Ur Scrn: NOT DETECTED
Cannabinoid 50 Ng, Ur ~~LOC~~: NOT DETECTED
Cocaine Metabolite,Ur ~~LOC~~: NOT DETECTED
MDMA (Ecstasy)Ur Screen: NOT DETECTED
Methadone Scn, Ur: NOT DETECTED
Opiate, Ur Screen: NOT DETECTED
Phencyclidine (PCP) Ur S: NOT DETECTED
Tricyclic, Ur Screen: NOT DETECTED

## 2021-05-17 LAB — TROPONIN I (HIGH SENSITIVITY)
Troponin I (High Sensitivity): 1086 ng/L (ref <18)
Troponin I (High Sensitivity): 1322 ng/L (ref <18)

## 2021-05-17 LAB — HIV ANTIBODY (ROUTINE TESTING W REFLEX): HIV Screen 4th Generation wRfx: NONREACTIVE

## 2021-05-17 LAB — PROTIME-INR
INR: 1.1 (ref 0.8–1.2)
Prothrombin Time: 14 seconds (ref 11.4–15.2)

## 2021-05-17 LAB — HEPARIN LEVEL (UNFRACTIONATED): Heparin Unfractionated: 0.23 IU/mL — ABNORMAL LOW (ref 0.30–0.70)

## 2021-05-17 LAB — BRAIN NATRIURETIC PEPTIDE: B Natriuretic Peptide: 112.4 pg/mL — ABNORMAL HIGH (ref 0.0–100.0)

## 2021-05-17 LAB — TSH: TSH: 1.198 u[IU]/mL (ref 0.350–4.500)

## 2021-05-17 MED ORDER — ASPIRIN 81 MG PO CHEW
324.0000 mg | CHEWABLE_TABLET | Freq: Once | ORAL | Status: AC
  Administered 2021-05-17: 324 mg via ORAL
  Filled 2021-05-17: qty 4

## 2021-05-17 MED ORDER — ACETAMINOPHEN 325 MG PO TABS: 650.0000 mg | ORAL_TABLET | ORAL | Status: DC | PRN

## 2021-05-17 MED ORDER — HEPARIN BOLUS VIA INFUSION
4000.0000 [IU] | Freq: Once | INTRAVENOUS | Status: AC
  Administered 2021-05-17: 4000 [IU] via INTRAVENOUS
  Filled 2021-05-17: qty 4000

## 2021-05-17 MED ORDER — BUPROPION HCL ER (XL) 150 MG PO TB24
150.0000 mg | ORAL_TABLET | Freq: Every day | ORAL | Status: DC
  Administered 2021-05-17 – 2021-05-18 (×2): 150 mg via ORAL
  Filled 2021-05-17 (×2): qty 1

## 2021-05-17 MED ORDER — ALLOPURINOL 100 MG PO TABS
100.0000 mg | ORAL_TABLET | Freq: Every day | ORAL | Status: DC
  Administered 2021-05-17 – 2021-05-18 (×2): 100 mg via ORAL
  Filled 2021-05-17 (×5): qty 1

## 2021-05-17 MED ORDER — FLUTICASONE FUROATE-VILANTEROL 100-25 MCG/INH IN AEPB
1.0000 | INHALATION_SPRAY | Freq: Every day | RESPIRATORY_TRACT | Status: DC
  Filled 2021-05-17: qty 28

## 2021-05-17 MED ORDER — NITROGLYCERIN 2 % TD OINT
0.5000 [in_us] | TOPICAL_OINTMENT | Freq: Four times a day (QID) | TRANSDERMAL | Status: AC
  Administered 2021-05-17 – 2021-05-18 (×2): 0.5 [in_us] via TOPICAL
  Filled 2021-05-17 (×2): qty 1

## 2021-05-17 MED ORDER — FLUTICASONE-UMECLIDIN-VILANT 100-62.5-25 MCG/INH IN AEPB: 1.0000 | INHALATION_SPRAY | Freq: Every day | RESPIRATORY_TRACT | Status: DC

## 2021-05-17 MED ORDER — SODIUM CHLORIDE 0.9 % IV BOLUS
1000.0000 mL | Freq: Once | INTRAVENOUS | Status: AC
  Administered 2021-05-17: 1000 mL via INTRAVENOUS

## 2021-05-17 MED ORDER — ONDANSETRON HCL 4 MG/2ML IJ SOLN: 4.0000 mg | Freq: Four times a day (QID) | INTRAMUSCULAR | Status: DC | PRN

## 2021-05-17 MED ORDER — ASPIRIN EC 81 MG PO TBEC
81.0000 mg | DELAYED_RELEASE_TABLET | Freq: Every day | ORAL | Status: DC
  Administered 2021-05-18 – 2021-05-19 (×2): 81 mg via ORAL
  Filled 2021-05-17 (×2): qty 1

## 2021-05-17 MED ORDER — UMECLIDINIUM BROMIDE 62.5 MCG/INH IN AEPB
1.0000 | INHALATION_SPRAY | Freq: Every day | RESPIRATORY_TRACT | Status: DC
  Filled 2021-05-17: qty 7

## 2021-05-17 MED ORDER — TAMSULOSIN HCL 0.4 MG PO CAPS
0.4000 mg | ORAL_CAPSULE | Freq: Every day | ORAL | Status: DC
  Administered 2021-05-17 – 2021-05-19 (×3): 0.4 mg via ORAL
  Filled 2021-05-17 (×3): qty 1

## 2021-05-17 MED ORDER — HEPARIN (PORCINE) 25000 UT/250ML-% IV SOLN
1650.0000 [IU]/h | INTRAVENOUS | Status: DC
  Administered 2021-05-17: 1250 [IU]/h via INTRAVENOUS
  Administered 2021-05-18: 1450 [IU]/h via INTRAVENOUS
  Filled 2021-05-17 (×2): qty 250

## 2021-05-17 MED ORDER — ALBUTEROL SULFATE (2.5 MG/3ML) 0.083% IN NEBU: 3.0000 mL | INHALATION_SOLUTION | Freq: Four times a day (QID) | RESPIRATORY_TRACT | Status: DC | PRN

## 2021-05-17 MED ORDER — FESOTERODINE FUMARATE ER 8 MG PO TB24
8.0000 mg | ORAL_TABLET | Freq: Every day | ORAL | Status: DC
  Administered 2021-05-17 – 2021-05-19 (×3): 8 mg via ORAL
  Filled 2021-05-17 (×3): qty 1

## 2021-05-17 MED ORDER — HEPARIN BOLUS VIA INFUSION
1450.0000 [IU] | Freq: Once | INTRAVENOUS | Status: AC
  Administered 2021-05-17: 1450 [IU] via INTRAVENOUS
  Filled 2021-05-17: qty 1450

## 2021-05-17 MED ORDER — NICOTINE 14 MG/24HR TD PT24
14.0000 mg | MEDICATED_PATCH | Freq: Every day | TRANSDERMAL | Status: DC | PRN
Start: 2021-05-17 — End: 2021-05-19

## 2021-05-17 NOTE — H&P (Addendum)
History and Physical   [name] [name] [id number] DOB: 1955/10/06 DOA: 05/17/2021  PCP: Jonetta Osgood, NP  Outpatient Specialists: Dr. Bonna Gains, GI Patient coming from: Home via [place]  I have personally briefly reviewed patient's old medical records in [place].  Chief Concern: Chest pain  HPI: [name] [name] is a 65 y.o. male with medical history significant for history of colon polyps, emphysema, presents to the emergency department for chief concerns of chest pain.  He reports when he was laying down on his back at night he felt left arm pain and then chest pain. Sharp pain down his left arm, until he fell asleep The chest pain was in the substernal area, described as tightness. This lasted until he fell asleep. He endorses baseline shortness from COPD and this has not changed. At baseline he reports when he walks upstairs and when he walks for about 1 block, he will get shortness of breath and this has not changed.   He denies nausea, vomiting, new cough, headache, vision changes, abdominal pain, dysuria, hematuria, diarrhea, constipation, syncope, lost of consciousness. He last had a bowel movement yesterday and it was normal for him.   He reports he had this chest tightness about two weeks ago after a day at the gym. That night, he developed a similar chest tightness. This lasted until he fell asleep that night.   His 87 years old cat ran away last 01-03-23. She was found dead in a dumpster.  Family history: no family history of heard problems that patient is aware  Social history: He lives with his wife of three months. They live with three dogs and two cats. He smokes 1/2 - 1 ppd. He denies recreational drug use. 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  Vaccination history: He is vaccinated for covid 19, Pfizer, three shots and two more doses.   ROS: Constitutional: no weight change, no fever ENT/Mouth: no sore throat, no  rhinorrhea Eyes: no eye pain, no vision changes Cardiovascular: + chest pain, no dyspnea,  no edema, no palpitations Respiratory: no cough, no sputum, no wheezing Gastrointestinal: no nausea, no vomiting, no diarrhea, no constipation Genitourinary: no urinary incontinence, no dysuria, no hematuria Musculoskeletal: no arthralgias, no myalgias Skin: no skin lesions, no pruritus, Neuro: no weakness, no loss of consciousness, no syncope Psych: no anxiety, no depression, no decrease appetite Heme/Lymph: no bruising, no bleeding  ED Course: Discussed with emergency medicine provider, patient requiring hospitalization for NSTEMI.  Vitals in the emergency department was remarkable for temperature 98.3, respiration rate of 18, heart rate of 70, blood pressure 101/75, SPO2 of 95% on room air.  Labs in the emergency department was remarkable for sodium 139, potassium 4.3, chloride 106, bicarb 24, BUN 15, serum creatinine of 1.24, nonfasting blood glucose 112, WBC 7, hemoglobin 14.6, platelets 173.  BNP was 112.4.  High-sensitivity troponin initially was 1086 and increased to 1322.  COVID/influenza A/influenza B PCR were negative.  Assessment/Plan  Principal Problem:   NSTEMI (non-ST elevated myocardial infarction) ([place]) Active Problems:   Benign prostatic hyperplasia with urinary frequency   Centrilobular emphysema (HCC)   Cigarette nicotine dependence with nicotine-induced disorder   Depression, major, single episode, mild (HCC)   History of colonic polyps   Polyp of sigmoid colon   # NSTEMI - Status post aspirin 324 mg per ED provider and normal saline 1 L bolus - Heparin GTT for ACS per pharmacy ordered - Complete echo ordered,  check UDS - Nitroglycerin 0.5 inch topical, every 6 hours, 3 doses - A.m. team to consult cardiology pending echocardiogram read  # Centrilobular emphysema-trilogy daily - Albuterol as needed for shortness of breath and wheezing - Trilogy daily  #  Depression/anxiety-bupropion 150 mg p.o. nightly  # Colon polyps - he was told to return in 6 months for repeat colonoscopy # Gout - allopurinol nightly resumed # BPH - tamsulosin 0.4 mg p.o. daily resumed  # Overactive bladder-fesoterodine resumed  Chart reviewed.   12/18/2020: EF was estimated at 76.8%, left ventricle normal in size, no regional wall motion abnormalities.  Left ventricle diastolic impaired relaxation pattern.  Right ventricle normal in size.  Left atrium slightly dilated.  Right atrium slightly dilated.  Mildly dilated aortic root.  DVT prophylaxis: Heparin GTT, TED hose Code Status: Full code Diet: Heart healthy Family Communication:   Disposition Plan: Pending clinical course Consults called: None at this time Admission status: Progressive cardiac, observation, telemetry ordered  Past Medical History:  Diagnosis Date   Arthritis    COPD (chronic obstructive pulmonary disease) ([place])    Depression    Emphysema of lung ([place])    Gout    left knee   Past Surgical History:  Procedure Laterality Date   COLONOSCOPY WITH PROPOFOL N/A 04/07/2021   Procedure: COLONOSCOPY WITH PROPOFOL;  Surgeon: Virgel Manifold, MD;  Location: [facility] ENDOSCOPY;  Service: Endoscopy;  Laterality: N/A;   Social History:  reports that he has been smoking cigarettes. He has a 104.00 pack-year smoking history. He has never used smokeless tobacco. He reports that he does not currently use alcohol. He reports that he does not use drugs.  No Known Allergies Family History  Problem Relation Age of Onset   Alcohol abuse Mother    Alcohol abuse Father    Alcohol abuse Brother    Family history: Family history reviewed and not pertinent  Prior to Admission medications   Medication Sig Start Date End Date Taking? Authorizing Provider  albuterol (VENTOLIN HFA) 108 (90 Base) MCG/ACT inhaler Inhale 2 puffs into the lungs every 6 (six) hours as needed for wheezing or shortness of breath.  03/16/21   Allyne Gee, MD  allopurinol (ZYLOPRIM) 100 MG tablet Take 2 tab po daily 11/26/20   Luiz Ochoa, NP  azithromycin (ZITHROMAX) 250 MG tablet Take 1 tablet (250 mg total) by mouth daily. Take first 2 tablets together, then 1 every day until finished. 12/15/20   Loura Halt A, NP  benzonatate (TESSALON) 100 MG capsule Take 1 capsule (100 mg total) by mouth every 8 (eight) hours. 12/15/20   Loura Halt A, NP  buPROPion (WELLBUTRIN XL) 150 MG 24 hr tablet Take 1 tablet (150 mg total) by mouth daily. 01/27/21   Lavera Guise, MD  busPIRone (BUSPAR) 10 MG tablet Take 1 tablet (10 mg total) by mouth 2 (two) times daily. 11/28/20   Luiz Ochoa, NP  Fluticasone-Umeclidin-Vilant (TRELEGY ELLIPTA) 100-62.5-25 MCG/INH AEPB Inhale 1 puff into the lungs daily. 03/16/21   Allyne Gee, MD  ibuprofen (ADVIL) 800 MG tablet Take 800 mg by mouth 2 (two) times daily as needed.    [provider]  meloxicam (MOBIC) 7.5 MG tablet TAKE 1 TABLET(7.5 MG) BY MOUTH DAILY 04/22/21   Jonetta Osgood, NP  ondansetron (ZOFRAN ODT) 4 MG disintegrating tablet Take 1 tablet (4 mg total) by mouth every 8 (eight) hours as needed for nausea or vomiting. 11/18/20   Luiz Ochoa, NP  sildenafil (VIAGRA) 100 MG tablet Take 1 tablet (100 mg total) by mouth daily as needed for erectile dysfunction. 11/28/20   Luiz Ochoa, NP  varenicline (CHANTIX PAK) 0.5 MG X 11 & 1 MG X 42 tablet Take one 0.5 mg tablet by mouth once daily for 3 days, then increase to one 0.5 mg tablet twice daily for 4 days, then increase to one 1 mg tablet twice daily. 03/30/21   Jonetta Osgood, NP   Physical Exam: Vitals:   05/17/21 [id number] 05/17/21 0836 05/17/21 0837 05/17/21 1000  BP: 101/75   119/80  Pulse: 70   65  Resp: 18   19  Temp:   98.3 F (36.8 C)   TempSrc:   Oral   SpO2: 95%   99%  Weight:  97.5 kg    Height:  '6\' 1"'$  (1.854 m)     Constitutional: appears age-appropriate, well kept, NAD, calm, comfortable Eyes: PERRL,  lids and conjunctivae normal ENMT: trachea midline, hearing appropriate, moist mucosa Neck: normal, supple, no masses, no thyromegaly Respiratory: clear to auscultation bilaterally, no wheezing, no crackles. Normal respiratory effort. No accessory muscle use.  Cardiovascular: Regular rate and rhythm, no murmurs / rubs / gallops. No extremity edema. 2+ pedal pulses. No carotid bruits.  Abdomen: no tenderness, no masses palpated, no hepatosplenomegaly. Bowel sounds positive.  Musculoskeletal: no clubbing / cyanosis. No joint deformity upper and lower extremities. Good ROM, no contractures, no atrophy. Normal muscle tone.  Skin: no rashes, lesions, ulcers. No induration Neurologic: Sensation intact. Strength 5/5 in all 4.  Psychiatric: Normal judgment and insight. Alert and oriented x 3. Tearful when discussing his  EKG: independently reviewed, showing sinus rhythm with rate of 73, QTc 423  Chest x-ray on Admission: I personally reviewed and I agree with radiologist reading as below.  DG Chest 2 View  Result Date: 05/17/2021 CLINICAL DATA:  Chest pain. EXAM: CHEST - 2 VIEW COMPARISON:  None. FINDINGS: The heart size and mediastinal contours are within normal limits. Both lungs are clear. The visualized skeletal structures are unremarkable. IMPRESSION: No active cardiopulmonary disease. Electronically Signed   By: Marijo Conception M.D.   On: 05/17/2021 09:20    Labs on Admission: I have personally reviewed following labs  CBC: Recent Labs  Lab 05/17/21 0840  WBC 7.0  HGB 14.6  HCT 42.4  MCV 89.1  PLT [id number]   Basic Metabolic Panel: Recent Labs  Lab 05/17/21 0840  NA 139  K 4.3  CL 106  CO2 24  GLUCOSE 112*  BUN 15  CREATININE 1.24  CALCIUM 9.3   GFR: Estimated Creatinine Clearance: 71.1 mL/min (by C-G formula based on SCr of 1.24 mg/dL).  Urine analysis:    Component Value Date/Time   APPEARANCEUR Clear 01/27/2021 1150   GLUCOSEU Negative 01/27/2021 1150   BILIRUBINUR  Negative 01/27/2021 1150   PROTEINUR Negative 01/27/2021 1150   NITRITE Negative 01/27/2021 1150   LEUKOCYTESUR Trace (A) 01/27/2021 1150   Dr. Tobie Poet [facility]  If 7PM-7AM, please contact overnight-coverage provider If 7AM-7PM, please contact day coverage provider [email or web address]  05/17/2021, 12:47 PM

## 2021-05-17 NOTE — ED Notes (Signed)
Pt resting comfortably at this time. NAD noted. Pt denies any further needs.   Wife, Jackelyn Poling updated on pt status and plan of care.

## 2021-05-17 NOTE — Progress Notes (Signed)
ANTICOAGULATION CONSULT NOTE - Initial Consult  Pharmacy Consult for heparin Indication: chest pain/ACS  No Known Allergies  Patient Measurements: Height: '6\' 1"'$  (185.4 cm) Weight: 97.5 kg (215 lb) IBW/kg (Calculated) : 79.9 Heparin Dosing Weight: 97.5 kg  Vital Signs: BP: 128/87 (08/28 1730) Pulse Rate: 63 (08/28 1730)  Labs: Recent Labs    05/17/21 0840 05/17/21 1102 05/17/21 1537 05/17/21 2054  HGB 14.6  --   --   --   HCT 42.4  --   --   --   PLT 173  --   --   --   APTT  --   --  120*  --   LABPROT  --   --  14.0  --   INR  --   --  1.1  --   HEPARINUNFRC  --   --   --  0.23*  CREATININE 1.24  --   --   --   TROPONINIHS 1,086* 1,322*  --   --      Estimated Creatinine Clearance: 71.1 mL/min (by C-G formula based on SCr of 1.24 mg/dL).   Medical History: Past Medical History:  Diagnosis Date   Arthritis    COPD (chronic obstructive pulmonary disease) ([place])    Depression    Emphysema of lung (HCC)    Gout    left knee    Medications:  (Not in a hospital admission) Scheduled:   allopurinol  100 mg Oral QHS   [START ON 05/18/2021] aspirin EC  81 mg Oral Daily   buPROPion  150 mg Oral QHS   fesoterodine  8 mg Oral Daily   [START ON 05/18/2021] fluticasone furoate-vilanterol  1 puff Inhalation Daily   nitroGLYCERIN  0.5 inch Topical Q6H   tamsulosin  0.4 mg Oral Daily   [START ON 05/18/2021] umeclidinium bromide  1 puff Inhalation Daily   Infusions:   heparin 1,250 Units/hr (05/17/21 1303)   PRN: acetaminophen, albuterol, nicotine, ondansetron (ZOFRAN) IV Anti-infectives (From admission, onward)    None       Assessment: 65YOM presenting with chest pain. Baseline CBC stable with Hgb 14.6, and platelets 173. Not on anticoagulation at home.  Baseline aPTT 120s  8/28 2054 HL = 0.23, subtherapeutic 1250 units/hr > 1450 units/hr   Goal of Therapy:  Heparin level 0.3-0.7 units/ml Monitor platelets by anticoagulation protocol: Yes   Plan:  8/28  2054 HL = 0.23, subtherapeutic Give 1450 units bolus x 1 Increase  heparin infusion to 1450 units/hr Heparin level in 6 hours following rate change  Daily CBC.  Dorothe Pea, PharmD, BCPS Clinical Pharmacist   05/17/2021 9:16 PM

## 2021-05-17 NOTE — ED Triage Notes (Signed)
Pt comes pov with chest pain and left arm pain last night. Denies pain today. Wanted to get his heart checked out. Denies heart history. Endorses new stressors.

## 2021-05-17 NOTE — Progress Notes (Signed)
ANTICOAGULATION CONSULT NOTE - Initial Consult  Pharmacy Consult for heparin Indication: chest pain/ACS  No Known Allergies  Patient Measurements: Height: '6\' 1"'$  (185.4 cm) Weight: 97.5 kg (215 lb) IBW/kg (Calculated) : 79.9 Heparin Dosing Weight: 97.5 kg  Vital Signs: Temp: 98.3 F (36.8 C) (08/28 0837) Temp Source: Oral (08/28 0837) BP: 119/80 (08/28 1000) Pulse Rate: 65 (08/28 1000)  Labs: Recent Labs    05/17/21 0840 05/17/21 1102  HGB 14.6  --   HCT 42.4  --   PLT 173  --   CREATININE 1.24  --   TROPONINIHS 1,086* 1,322*    Estimated Creatinine Clearance: 71.1 mL/min (by C-G formula based on SCr of 1.24 mg/dL).   Medical History: Past Medical History:  Diagnosis Date   Arthritis    COPD (chronic obstructive pulmonary disease) ([place])    Depression    Emphysema of lung (HCC)    Gout    left knee    Medications:  (Not in a hospital admission)  Scheduled:   allopurinol  100 mg Oral QHS   [START ON 05/18/2021] aspirin EC  81 mg Oral Daily   buPROPion  150 mg Oral QHS   fesoterodine  8 mg Oral Daily   [START ON 05/18/2021] fluticasone furoate-vilanterol  1 puff Inhalation Daily   nitroGLYCERIN  0.5 inch Topical Q6H   tamsulosin  0.4 mg Oral Daily   [START ON 05/18/2021] umeclidinium bromide  1 puff Inhalation Daily   Infusions:   heparin 1,250 Units/hr (05/17/21 1303)   PRN: acetaminophen, albuterol, nicotine, ondansetron (ZOFRAN) IV Anti-infectives (From admission, onward)    None       Assessment: [name] [name] presenting with chest pain. Baseline CBC stable with Hgb 14.6, and platelets 173. Not on anticoagulation at home.    Goal of Therapy:  Heparin level 0.3-0.7 units/ml Monitor platelets by anticoagulation protocol: Yes   Plan:  Give 4000 units bolus x 1 Start heparin infusion at 1250 units/hr Baseline INR and aPTT.  Heparin level in 6 hours. Daily CBC.  Wynelle [place], PharmD Pharmacy Resident  05/17/2021 3:46 PM

## 2021-05-17 NOTE — ED Notes (Signed)
Pt resting comfortably at this time. No chest pain per pt,. Wife at bedside. Heparin infusing at this time. VSS. Nitro paste placed on L chest.

## 2021-05-17 NOTE — ED Notes (Signed)
2 hr trop elevated, No critical lab result called from Lab, MD aware.

## 2021-05-17 NOTE — ED Provider Notes (Signed)
[facility] Emergency Department Provider Note   ____________________________________________   Event Date/Time   First MD Initiated Contact with Patient 05/17/21 [phone]     (approximate)  I have reviewed the triage vital signs and the nursing notes.   HISTORY  Chief Complaint Chest Pain    HPI [name] [name] is a 65 y.o. male who presents for chest pain  LOCATION: Chest DURATION: 1 day TIMING: Began last evening and has resolved since onset SEVERITY: 7/10 QUALITY: Crushing, pressure CONTEXT: Patient states that he recently lost a family pet and has increased stress over the last week.  Last evening he began having chest and left arm pain that resolved overnight but decided to get it checked out this morning MODIFYING FACTORS: Denies any exacerbating or relieving factors ASSOCIATED SYMPTOMS: Left arm pain   Per medical record review, patient has history of COPD          Past Medical History:  Diagnosis Date   Arthritis    COPD (chronic obstructive pulmonary disease) ([place])    Depression    Emphysema of lung (HCC)    Gout    left knee    Patient Active Problem List   Diagnosis Date Noted   NSTEMI (non-ST elevated myocardial infarction) ([place]) 05/17/2021   Centrilobular emphysema ([place]) 04/07/2021   Cigarette nicotine dependence with nicotine-induced disorder 04/07/2021   Depression, major, single episode, mild ([place]) 04/07/2021   History of colonic polyps    Polyp of sigmoid colon    Rectal polyp    Elevated PSA 12/26/2019   Benign prostatic hyperplasia with urinary frequency 12/26/2019    Past Surgical History:  Procedure Laterality Date   COLONOSCOPY WITH PROPOFOL N/A 04/07/2021   Procedure: COLONOSCOPY WITH PROPOFOL;  Surgeon: Virgel Manifold, MD;  Location: [facility] ENDOSCOPY;  Service: Endoscopy;  Laterality: N/A;    Prior to Admission medications   Medication Sig Start Date End Date Taking? Authorizing Provider   albuterol (VENTOLIN HFA) 108 (90 Base) MCG/ACT inhaler Inhale 2 puffs into the lungs every 6 (six) hours as needed for wheezing or shortness of breath. 03/16/21   Allyne Gee, MD  allopurinol (ZYLOPRIM) 100 MG tablet Take 2 tab po daily 11/26/20   Luiz Ochoa, NP  azithromycin (ZITHROMAX) 250 MG tablet Take 1 tablet (250 mg total) by mouth daily. Take first 2 tablets together, then 1 every day until finished. 12/15/20   Loura Halt A, NP  benzonatate (TESSALON) 100 MG capsule Take 1 capsule (100 mg total) by mouth every 8 (eight) hours. 12/15/20   Loura Halt A, NP  buPROPion (WELLBUTRIN XL) 150 MG 24 hr tablet Take 1 tablet (150 mg total) by mouth daily. 01/27/21   Lavera Guise, MD  busPIRone (BUSPAR) 10 MG tablet Take 1 tablet (10 mg total) by mouth 2 (two) times daily. 11/28/20   Luiz Ochoa, NP  Fluticasone-Umeclidin-Vilant (TRELEGY ELLIPTA) 100-62.5-25 MCG/INH AEPB Inhale 1 puff into the lungs daily. 03/16/21   Allyne Gee, MD  ibuprofen (ADVIL) 800 MG tablet Take 800 mg by mouth 2 (two) times daily as needed.    [provider]  meloxicam (MOBIC) 7.5 MG tablet TAKE 1 TABLET(7.5 MG) BY MOUTH DAILY 04/22/21   Jonetta Osgood, NP  ondansetron (ZOFRAN ODT) 4 MG disintegrating tablet Take 1 tablet (4 mg total) by mouth every 8 (eight) hours as needed for nausea or vomiting. 11/18/20   Luiz Ochoa, NP  sildenafil (VIAGRA) 100 MG tablet Take 1 tablet (  100 mg total) by mouth daily as needed for erectile dysfunction. 11/28/20   Luiz Ochoa, NP  varenicline (CHANTIX PAK) 0.5 MG X 11 & 1 MG X 42 tablet Take one 0.5 mg tablet by mouth once daily for 3 days, then increase to one 0.5 mg tablet twice daily for 4 days, then increase to one 1 mg tablet twice daily. 03/30/21   Jonetta Osgood, NP    Allergies Patient has no known allergies.  Family History  Problem Relation Age of Onset   Alcohol abuse Mother    Alcohol abuse Father    Alcohol abuse Brother     Social  History Social History   Tobacco Use   Smoking status: Every Day    Packs/day: 2.00    Years: 52.00    Pack years: 104.00    Types: Cigarettes   Smokeless tobacco: Never   Tobacco comments:    10 a day currently  Substance Use Topics   Alcohol use: Not Currently   Drug use: Never    Review of Systems Constitutional: No fever/chills Eyes: No visual changes. ENT: No sore throat. Cardiovascular: Endorses chest pain. Respiratory: Denies shortness of breath. Gastrointestinal: No abdominal pain.  No nausea, no vomiting.  No diarrhea. Genitourinary: Negative for dysuria. Musculoskeletal: Negative for acute arthralgias Skin: Negative for rash. Neurological: Negative for headaches, weakness/numbness/paresthesias in any extremity Psychiatric: Negative for suicidal ideation/homicidal ideation   ____________________________________________   PHYSICAL EXAM:  VITAL SIGNS: ED Triage Vitals  Enc Vitals Group     BP 05/17/21 0835 101/75     Pulse Rate 05/17/21 0835 70     Resp 05/17/21 0835 18     Temp 05/17/21 0837 98.3 F (36.8 C)     Temp Source 05/17/21 0837 Oral     SpO2 05/17/21 0835 95 %     Weight 05/17/21 0836 215 lb (97.5 kg)     Height 05/17/21 0836 '6\' 1"'$  (1.854 m)     Head Circumference --      Peak Flow --      Pain Score 05/17/21 0835 0     Pain Loc --      Pain Edu? --      Excl. in [place]? --    Constitutional: Alert and oriented. Well appearing and in no acute distress. Eyes: Conjunctivae are normal. PERRL. Head: Atraumatic. Nose: No congestion/rhinnorhea. Mouth/Throat: Mucous membranes are moist. Neck: No stridor Cardiovascular: Grossly normal heart sounds.  Good peripheral circulation. Respiratory: Normal respiratory effort.  No retractions. Gastrointestinal: Soft and nontender. No distention. Musculoskeletal: No obvious deformities Neurologic:  Normal speech and language. No gross focal neurologic deficits are appreciated. Skin:  Skin is warm and dry.  No rash noted. Psychiatric: Mood and affect are normal. Speech and behavior are normal.  ____________________________________________   LABS (all labs ordered are listed, but only abnormal results are displayed)  Labs Reviewed  BASIC METABOLIC PANEL - Abnormal; Notable for the following components:      Result Value   Glucose, Bld 112 (*)    All other components within normal limits  BRAIN NATRIURETIC PEPTIDE - Abnormal; Notable for the following components:   B Natriuretic Peptide 112.4 (*)    All other components within normal limits  TROPONIN I (HIGH SENSITIVITY) - Abnormal; Notable for the following components:   Troponin I (High Sensitivity) 1,086 (*)    All other components within normal limits  TROPONIN I (HIGH SENSITIVITY) - Abnormal; Notable for the following components:   Troponin  I (High Sensitivity) 1,322 (*)    All other components within normal limits  RESP PANEL BY RT-PCR (FLU A&B, COVID) ARPGX2  CBC  HIV ANTIBODY (ROUTINE TESTING W REFLEX)  TSH  VITAMIN B12  PROTIME-INR  APTT  HEPARIN LEVEL (UNFRACTIONATED)   ____________________________________________  EKG  ED ECG REPORT I, Naaman Plummer, the attending physician, personally viewed and interpreted this ECG.  Date: 05/17/2021 EKG Time: 0830 Rate: 73 Rhythm: normal sinus rhythm QRS Axis: normal Intervals: normal ST/T Wave abnormalities: normal Narrative Interpretation: no evidence of acute ischemia  ____________________________________________  RADIOLOGY  ED MD interpretation: 2 view chest x-ray shows no evidence of acute abnormalities including no pneumonia, pneumothorax, or widened mediastinum  Official radiology report(s): DG Chest 2 View  Result Date: 05/17/2021 CLINICAL DATA:  Chest pain. EXAM: CHEST - 2 VIEW COMPARISON:  None. FINDINGS: The heart size and mediastinal contours are within normal limits. Both lungs are clear. The visualized skeletal structures are unremarkable. IMPRESSION: No  active cardiopulmonary disease. Electronically Signed   By: Marijo Conception M.D.   On: 05/17/2021 09:20    ____________________________________________   PROCEDURES  Procedure(s) performed (including Critical Care):  .1-3 Lead EKG Interpretation  Date/Time: 05/17/2021 1:38 PM Performed by: Naaman Plummer, MD Authorized by: Naaman Plummer, MD     Interpretation: normal     ECG rate:  61   ECG rate assessment: normal     Rhythm: sinus rhythm     Ectopy: none     Conduction: normal   .Critical Care  Date/Time: 05/17/2021 1:38 PM Performed by: Naaman Plummer, MD Authorized by: Naaman Plummer, MD   Critical care provider statement:    Critical care time (minutes):  35   Critical care time was exclusive of:  Separately billable procedures and treating other patients   Critical care was necessary to treat or prevent imminent or life-threatening deterioration of the following conditions:  Cardiac failure   Critical care was time spent personally by me on the following activities:  Discussions with consultants, evaluation of patient's response to treatment, examination of patient, ordering and performing treatments and interventions, ordering and review of laboratory studies, ordering and review of radiographic studies, pulse oximetry, re-evaluation of patient's condition, obtaining history from patient or surrogate and review of old charts   I assumed direction of critical care for this patient from another provider in my specialty: no     Care discussed with: admitting provider     ____________________________________________   INITIAL IMPRESSION / ASSESSMENT AND PLAN / ED COURSE  As part of my medical decision making, I reviewed the following data within the electronic medical record, if available:  Nursing notes reviewed and incorporated, Labs reviewed, EKG interpreted, Old chart reviewed, Radiograph reviewed and Notes from prior ED visits reviewed and incorporated         Workup: ECG, CXR, CBC, CMP, Troponin Findings: ECG: No overt evidence of STEMI. No evidence of Brugadas sign, delta wave, epsilon wave, significantly prolonged QTc, or malignant arrhythmia Troponin: 1086 Other Labs unremarkable for emergent problems. CXR: Without PTX, PNA, or widened mediastinum Last Stress Test: never Last Heart Catheterization: never HEART Score: 4  Given History, Exam, and Workup concern for NSTEMI.  I have low suspicion for Pneumothorax, Pneumonia, Pulmonary Embolus, Tamponade, Aortic Dissection  Interventions: ASA 324or'325mg'$  Heparin Bolus 60-70u/kg (max 5000) Heparin gtt about 12-15u/kg/hr (max 1000/hr) PRN analgesia with fentanyl, morphine PRN antiemetic therapy  Dispo: Admit      ____________________________________________  FINAL CLINICAL IMPRESSION(S) / ED DIAGNOSES  Final diagnoses:  NSTEMI (non-ST elevated myocardial infarction) [facility])  Other forms of angina pectoris [facility])     ED Discharge Orders     None        Note:  This document was prepared using Dragon voice recognition software and may include unintentional dictation errors.    Naaman Plummer, MD 05/17/21 [phone]

## 2021-05-17 NOTE — ED Notes (Signed)
Pt presents to ED with c/o of chest pain that happened yesterday. Pt states his CP initially started midsternum area and radiated down L arm, pt denies numbness or tingling. Pt states when he went to sleep pain was still there but when he woke up pain was gone, pt still denies any CP at this time. Pt denies any SOB or N/V/D during his CP episode.   Pt states recent loss of long time pet and admits this has been an ongoing stressor for the past few days. Pt denies any cardiac HX. Pt is A&Ox4 at this time. NAD noted.

## 2021-05-18 ENCOUNTER — Encounter
Admission: EM | Disposition: A | Discharge status: Home / Self Care | Payer: Self-pay | Source: Home / Self Care | Attending: Emergency Medicine

## 2021-05-18 DIAGNOSIS — I2510 Atherosclerotic heart disease of native coronary artery without angina pectoris: Secondary | ICD-10-CM

## 2021-05-18 DIAGNOSIS — I214 Non-ST elevation (NSTEMI) myocardial infarction: Secondary | ICD-10-CM | POA: Diagnosis not present

## 2021-05-18 DIAGNOSIS — J449 Chronic obstructive pulmonary disease, unspecified: Secondary | ICD-10-CM | POA: Diagnosis not present

## 2021-05-18 DIAGNOSIS — R739 Hyperglycemia, unspecified: Secondary | ICD-10-CM

## 2021-05-18 DIAGNOSIS — J432 Centrilobular emphysema: Secondary | ICD-10-CM | POA: Diagnosis not present

## 2021-05-18 HISTORY — PX: LEFT HEART CATH AND CORONARY ANGIOGRAPHY: CATH118249

## 2021-05-18 LAB — BASIC METABOLIC PANEL
Anion gap: 6 (ref 5–15)
BUN: 13 mg/dL (ref 8–23)
CO2: 26 mmol/L (ref 22–32)
Calcium: 8.8 mg/dL — ABNORMAL LOW (ref 8.9–10.3)
Chloride: 106 mmol/L (ref 98–111)
Creatinine, Ser: 0.98 mg/dL (ref 0.61–1.24)
GFR, Estimated: >60 mL/min (ref >60)
Glucose, Bld: 112 mg/dL — ABNORMAL HIGH (ref 70–99)
Potassium: 4.1 mmol/L (ref 3.5–5.1)
Sodium: 138 mmol/L (ref 135–145)

## 2021-05-18 LAB — HEMOGLOBIN A1C
Hgb A1c MFr Bld: 6.1 % — ABNORMAL HIGH (ref 4.8–5.6)
Mean Plasma Glucose: 128.37 mg/dL

## 2021-05-18 LAB — LIPID PANEL
Cholesterol: 142 mg/dL (ref 0–200)
HDL: 32 mg/dL — ABNORMAL LOW (ref >40)
LDL Cholesterol: 93 mg/dL (ref 0–99)
Total CHOL/HDL Ratio: 4.4 RATIO
Triglycerides: 85 mg/dL (ref <150)
VLDL: 17 mg/dL (ref 0–40)

## 2021-05-18 LAB — CBC
HCT: 39.3 % (ref 39.0–52.0)
Hemoglobin: 13.5 g/dL (ref 13.0–17.0)
MCH: 30.3 pg (ref 26.0–34.0)
MCHC: 34.4 g/dL (ref 30.0–36.0)
MCV: 88.3 fL (ref 80.0–100.0)
Platelets: 156 10*3/uL (ref 150–400)
RBC: 4.45 MIL/uL (ref 4.22–5.81)
RDW: 13.1 % (ref 11.5–15.5)
WBC: 6.8 10*3/uL (ref 4.0–10.5)
nRBC: 0 % (ref 0.0–0.2)

## 2021-05-18 LAB — HEPARIN LEVEL (UNFRACTIONATED)
Heparin Unfractionated: 0.28 IU/mL — ABNORMAL LOW (ref 0.30–0.70)
Heparin Unfractionated: 0.69 IU/mL (ref 0.30–0.70)

## 2021-05-18 LAB — TROPONIN I (HIGH SENSITIVITY): Troponin I (High Sensitivity): 674 ng/L (ref <18)

## 2021-05-18 SURGERY — LEFT HEART CATH AND CORONARY ANGIOGRAPHY
Anesthesia: Moderate Sedation

## 2021-05-18 MED ORDER — HEPARIN SODIUM (PORCINE) 1000 UNIT/ML IJ SOLN
INTRAMUSCULAR | Status: DC | PRN
  Administered 2021-05-18: 5000 [IU] via INTRAVENOUS

## 2021-05-18 MED ORDER — CLOPIDOGREL BISULFATE 75 MG PO TABS
75.0000 mg | ORAL_TABLET | Freq: Every day | ORAL | Status: DC
  Filled 2021-05-18: qty 1

## 2021-05-18 MED ORDER — MIDAZOLAM HCL 2 MG/2ML IJ SOLN
INTRAMUSCULAR | Status: AC
  Filled 2021-05-18: qty 2

## 2021-05-18 MED ORDER — ATORVASTATIN CALCIUM 80 MG PO TABS
80.0000 mg | ORAL_TABLET | Freq: Every day | ORAL | Status: DC
  Administered 2021-05-18 – 2021-05-19 (×2): 80 mg via ORAL
  Filled 2021-05-18 (×2): qty 1

## 2021-05-18 MED ORDER — SODIUM CHLORIDE 0.9 % WEIGHT BASED INFUSION
1.0000 mL/kg/h | INTRAVENOUS | Status: DC
Start: 2021-05-19 — End: 2021-05-18

## 2021-05-18 MED ORDER — VERAPAMIL HCL 2.5 MG/ML IV SOLN
INTRAVENOUS | Status: DC | PRN
  Administered 2021-05-18: 2.5 mg via INTRA_ARTERIAL

## 2021-05-18 MED ORDER — CLOPIDOGREL BISULFATE 300 MG PO TABS
ORAL_TABLET | ORAL | Status: AC
  Filled 2021-05-18: qty 1

## 2021-05-18 MED ORDER — NICOTINE 21 MG/24HR TD PT24
21.0000 mg | MEDICATED_PATCH | Freq: Every day | TRANSDERMAL | Status: DC
  Administered 2021-05-18: 21 mg via TRANSDERMAL
  Filled 2021-05-18: qty 1

## 2021-05-18 MED ORDER — LIDOCAINE HCL 1 % IJ SOLN
INTRAMUSCULAR | Status: AC
  Filled 2021-05-18: qty 20

## 2021-05-18 MED ORDER — SODIUM CHLORIDE 0.9% FLUSH
3.0000 mL | Freq: Two times a day (BID) | INTRAVENOUS | Status: DC
  Administered 2021-05-18: 3 mL via INTRAVENOUS

## 2021-05-18 MED ORDER — SODIUM CHLORIDE 0.9 % IV SOLN: 250.0000 mL | INTRAVENOUS | Status: DC | PRN

## 2021-05-18 MED ORDER — SODIUM CHLORIDE 0.9% FLUSH: 3.0000 mL | INTRAVENOUS | Status: DC | PRN

## 2021-05-18 MED ORDER — ENOXAPARIN SODIUM 40 MG/0.4ML IJ SOSY
40.0000 mg | PREFILLED_SYRINGE | INTRAMUSCULAR | Status: DC
  Administered 2021-05-19: 40 mg via SUBCUTANEOUS
  Filled 2021-05-18: qty 0.4

## 2021-05-18 MED ORDER — METOPROLOL TARTRATE 25 MG PO TABS
25.0000 mg | ORAL_TABLET | Freq: Two times a day (BID) | ORAL | Status: DC
  Administered 2021-05-18 – 2021-05-19 (×2): 25 mg via ORAL
  Filled 2021-05-18 (×2): qty 1

## 2021-05-18 MED ORDER — CLOPIDOGREL BISULFATE 75 MG PO TABS
ORAL_TABLET | ORAL | Status: DC | PRN
  Administered 2021-05-18: 300 mg via ORAL

## 2021-05-18 MED ORDER — FENTANYL CITRATE PF 50 MCG/ML IJ SOSY
PREFILLED_SYRINGE | INTRAMUSCULAR | Status: AC
  Filled 2021-05-18: qty 1

## 2021-05-18 MED ORDER — FENTANYL CITRATE (PF) 100 MCG/2ML IJ SOLN
INTRAMUSCULAR | Status: DC | PRN
  Administered 2021-05-18: 25 ug via INTRAVENOUS

## 2021-05-18 MED ORDER — VERAPAMIL HCL 2.5 MG/ML IV SOLN
INTRAVENOUS | Status: AC
  Filled 2021-05-18: qty 2

## 2021-05-18 MED ORDER — HEPARIN SODIUM (PORCINE) 1000 UNIT/ML IJ SOLN
INTRAMUSCULAR | Status: AC
  Filled 2021-05-18: qty 1

## 2021-05-18 MED ORDER — HEPARIN (PORCINE) IN NACL 1000-0.9 UT/500ML-% IV SOLN
INTRAVENOUS | Status: DC | PRN
  Administered 2021-05-18: 1000 mL

## 2021-05-18 MED ORDER — LIDOCAINE HCL (PF) 1 % IJ SOLN
INTRAMUSCULAR | Status: DC | PRN
  Administered 2021-05-18: 2 mL

## 2021-05-18 MED ORDER — SODIUM CHLORIDE 0.9 % IV SOLN: INTRAVENOUS | Status: AC

## 2021-05-18 MED ORDER — MIDAZOLAM HCL 2 MG/2ML IJ SOLN
INTRAMUSCULAR | Status: DC | PRN
  Administered 2021-05-18: 1 mg via INTRAVENOUS

## 2021-05-18 MED ORDER — IOHEXOL 350 MG/ML SOLN
INTRAVENOUS | Status: DC | PRN
  Administered 2021-05-18: 38 mL

## 2021-05-18 MED ORDER — HEPARIN BOLUS VIA INFUSION
1450.0000 [IU] | Freq: Once | INTRAVENOUS | Status: AC
  Administered 2021-05-18: 1450 [IU] via INTRAVENOUS
  Filled 2021-05-18: qty 1450

## 2021-05-18 MED ORDER — SODIUM CHLORIDE 0.9 % WEIGHT BASED INFUSION
3.0000 mL/kg/h | INTRAVENOUS | Status: DC
  Administered 2021-05-18: 3 mL/kg/h via INTRAVENOUS

## 2021-05-18 MED ORDER — HEPARIN (PORCINE) IN NACL 1000-0.9 UT/500ML-% IV SOLN
INTRAVENOUS | Status: AC
  Filled 2021-05-18: qty 1000

## 2021-05-18 SURGICAL SUPPLY — 11 items
CATH INFINITI 5FR JK (CATHETERS) ×2 IMPLANT
DEVICE RAD TR BAND REGULAR (VASCULAR PRODUCTS) ×2 IMPLANT
DRAPE BRACHIAL (DRAPES) ×2 IMPLANT
GLIDESHEATH SLEND SS 6F .021 (SHEATH) ×2 IMPLANT
GUIDEWIRE INQWIRE 1.5J.035X260 (WIRE) ×1 IMPLANT
INQWIRE 1.5J .035X260CM (WIRE) ×2
KIT SYRINGE INJ CVI SPIKEX1 (MISCELLANEOUS) ×2 IMPLANT
PACK CARDIAC CATH (CUSTOM PROCEDURE TRAY) ×2 IMPLANT
PROTECTION STATION PRESSURIZED (MISCELLANEOUS) ×2
SET ATX SIMPLICITY (MISCELLANEOUS) ×2 IMPLANT
STATION PROTECTION PRESSURIZED (MISCELLANEOUS) ×1 IMPLANT

## 2021-05-18 NOTE — Consult Note (Signed)
Cardiology Consultation:   Patient ID: [name] [name]; [id number]; 1956-05-20   Admit date: 05/17/2021 Date of Consult: 05/18/2021  Primary Care Provider: Jonetta Osgood, NP Primary Cardiologist: New to [facility] - consult by Fletcher Anon Primary Electrophysiologist:  None   Patient Profile:   [name] [name] is a 65 y.o. male with a hx of COPD with ongoing tobacco use of 0.5 to 1 pack daily for 50 years, gout, and depression who is being seen today for the evaluation of NSTEMI at the request of Dr. Posey Pronto.  History of Present Illness:   [name] has no previously known cardiac history. He does report having undergone echo and stress testing while living in [place] years ago. Specifics of these tests are uncertain. He did undergo an echo in 11/2020 through his PCP's office in 11/2020, that showed a preserved LVSF. Sadly, he suffered the loss of a cat this past week. The patient was in his usual state of health until the night of 05/16/2021, while laying down to go to sleep, he developed onset of substernal chest pain that radiated down his left arm. Pain lasted ~ 15 minutes/until he fell asleep. There were no associated symptoms. Upon waking, up on 8/28, and throughout the day on 8/28, he has been asymptomatic. He does note some exertional dyspnea when going up stairs, but reports this has been stable for quite some time. Due to symptoms on 8/27, he presented to [facility] on 8/28 for further evaluation. Vitals have been stable. Initial EKG showed sinus rhythm without acute st/t changes as outlined below. Initial high sensitivity troponin 1086 with a delta troponin of 1322. BNP 112. Covid and influenza negative. Urine drug screen negative. In the ED, he was given ASA 324 mg and nitro paste was applied. He has been started on a heparin gtt. Echo 05/17/2021 showed an EF of 55-60%, no RWMA, Gr1DD, normal RVSF and ventricular cavity size, mildly dilated left atrium, no evidence of a pericardial effusion,  and no valvular abnormalities. Currently, symptom free.    Past Medical History:  Diagnosis Date   Arthritis    COPD (chronic obstructive pulmonary disease) ([place])    Depression    Emphysema of lung ([place])    Gout    left knee    Past Surgical History:  Procedure Laterality Date   COLONOSCOPY WITH PROPOFOL N/A 04/07/2021   Procedure: COLONOSCOPY WITH PROPOFOL;  Surgeon: Virgel Manifold, MD;  Location: [facility] ENDOSCOPY;  Service: Endoscopy;  Laterality: N/A;     Home Meds: Prior to Admission medications   Medication Sig Start Date End Date Taking? Authorizing Provider  allopurinol (ZYLOPRIM) 100 MG tablet Take 2 tab po daily 11/26/20  Yes Luiz Ochoa, NP  buPROPion (WELLBUTRIN XL) 150 MG 24 hr tablet Take 1 tablet (150 mg total) by mouth daily. 01/27/21  Yes Lavera Guise, MD  Fluticasone-Umeclidin-Vilant (TRELEGY ELLIPTA) 100-62.5-25 MCG/INH AEPB Inhale 1 puff into the lungs daily. 03/16/21  Yes Allyne Gee, MD  indomethacin (INDOCIN) 50 MG capsule Take 50 mg by mouth at bedtime. 05/01/21  Yes [provider]  meloxicam (MOBIC) 7.5 MG tablet TAKE 1 TABLET(7.5 MG) BY MOUTH DAILY 04/22/21  Yes Abernathy, Alyssa, NP  sildenafil (VIAGRA) 100 MG tablet Take 1 tablet (100 mg total) by mouth daily as needed for erectile dysfunction. 11/28/20  Yes Luiz Ochoa, NP  tamsulosin (FLOMAX) 0.4 MG CAPS capsule Take 0.4 mg by mouth daily. 04/23/21  Yes [provider]  tolterodine (DETROL LA) 4  MG 24 hr capsule Take 4 mg by mouth daily. 04/15/21  Yes [provider]  albuterol (VENTOLIN HFA) 108 (90 Base) MCG/ACT inhaler Inhale 2 puffs into the lungs every 6 (six) hours as needed for wheezing or shortness of breath. 03/16/21   Allyne Gee, MD  busPIRone (BUSPAR) 10 MG tablet Take 1 tablet (10 mg total) by mouth 2 (two) times daily. Patient not taking: Reported on 05/17/2021 11/28/20   Luiz Ochoa, NP  ibuprofen (ADVIL) 800 MG tablet Take 800 mg by mouth 2 (two)  times daily as needed. Patient not taking: No sig reported    [provider]  ondansetron (ZOFRAN ODT) 4 MG disintegrating tablet Take 1 tablet (4 mg total) by mouth every 8 (eight) hours as needed for nausea or vomiting. Patient not taking: Reported on 05/17/2021 11/18/20   Luiz Ochoa, NP  varenicline (CHANTIX PAK) 0.5 MG X 11 & 1 MG X 42 tablet Take one 0.5 mg tablet by mouth once daily for 3 days, then increase to one 0.5 mg tablet twice daily for 4 days, then increase to one 1 mg tablet twice daily. Patient not taking: Reported on 05/17/2021 03/30/21   Jonetta Osgood, NP  WIXELA INHUB 100-50 MCG/ACT AEPB Inhale 1 puff into the lungs 2 (two) times daily. Patient not taking: No sig reported 04/22/21   [provider]    Inpatient Medications: Scheduled Meds:  allopurinol  100 mg Oral QHS   aspirin EC  81 mg Oral Daily   buPROPion  150 mg Oral QHS   fesoterodine  8 mg Oral Daily   fluticasone furoate-vilanterol  1 puff Inhalation Daily   tamsulosin  0.4 mg Oral Daily   umeclidinium bromide  1 puff Inhalation Daily   Continuous Infusions:  heparin 1,650 Units/hr (05/18/21 0559)   PRN Meds: acetaminophen, albuterol, nicotine, ondansetron (ZOFRAN) IV  Allergies:  No Known Allergies  Social History:   Social History   Socioeconomic History   Marital status: Married    Spouse name: Not on file   Number of children: Not on file   Years of education: Not on file   Highest education level: Not on file  Occupational History   Not on file  Tobacco Use   Smoking status: Every Day    Packs/day: 2.00    Years: 52.00    Pack years: 104.00    Types: Cigarettes   Smokeless tobacco: Never   Tobacco comments:    10 a day currently  Substance and Sexual Activity   Alcohol use: Not Currently   Drug use: Never   Sexual activity: Not on file  Other Topics Concern   Not on file  Social History Narrative   Not on file   Social Determinants of Health    Financial Resource Strain: Not on file  Food Insecurity: Not on file  Transportation Needs: Not on file  Physical Activity: Not on file  Stress: Not on file  Social Connections: Not on file  Intimate Partner Violence: Not on file     Family History:   Family History  Problem Relation Age of Onset   Alcohol abuse Mother    Alcohol abuse Father    Alcohol abuse Brother     ROS:  Review of Systems  Constitutional:  Negative for chills, diaphoresis, fever, malaise/fatigue and weight loss.  HENT:  Negative for congestion.   Eyes:  Negative for discharge and redness.  Respiratory:  Negative for cough, sputum production, shortness  of breath and wheezing.   Cardiovascular:  Positive for chest pain. Negative for palpitations, orthopnea, claudication, leg swelling and PND.  Gastrointestinal:  Negative for abdominal pain, heartburn, melena, nausea and vomiting.  Musculoskeletal:  Negative for falls and myalgias.  Skin:  Negative for rash.  Neurological:  Negative for dizziness, tingling, tremors, sensory change, speech change, focal weakness, loss of consciousness and weakness.  Endo/Heme/Allergies:  Does not bruise/bleed easily.  Psychiatric/Behavioral:  Negative for substance abuse. The patient is not nervous/anxious.   All other systems reviewed and are negative.    Physical Exam/Data:   Vitals:   05/17/21 2135 05/18/21 0115 05/18/21 0457 05/18/21 0528  BP: 119/78 121/78  110/83  Pulse: 72 69  61  Resp: 18 (!) 23  18  Temp: 98.8 F (37.1 C) 98.1 F (36.7 C)  98.2 F (36.8 C)  TempSrc: Oral Oral  Oral  SpO2: 100% 98%  99%  Weight:   98 kg   Height:        Intake/Output Summary (Last 24 hours) at 05/18/2021 0812 Last data filed at 05/18/2021 0743 Gross per 24 hour  Intake 871.87 ml  Output 300 ml  Net 571.87 ml   Filed Weights   05/17/21 0836 05/18/21 0457  Weight: 97.5 kg 98 kg   Body mass index is 28.51 kg/m.   Physical Exam: General: Well developed, well  nourished, in no acute distress. Head: Normocephalic, atraumatic, sclera non-icteric, no xanthomas, nares without discharge.  Neck: Negative for carotid bruits. JVD not elevated. Lungs: Clear bilaterally to auscultation without wheezes, rales, or rhonchi. Breathing is unlabored. Heart: RRR with S1 S2. I/VI systolic murmur, no rubs, or gallops appreciated. Abdomen: Soft, non-tender, non-distended with normoactive bowel sounds. No hepatomegaly. No rebound/guarding. No obvious abdominal masses. Msk:  Strength and tone appear normal for age. Extremities: No clubbing or cyanosis. No edema. Distal pedal pulses are 2+ and equal bilaterally. Neuro: Alert and oriented X 3. No facial asymmetry. No focal deficit. Moves all extremities spontaneously. Psych:  Responds to questions appropriately with a normal affect.   EKG:  The EKG was personally reviewed and demonstrates: NSR, 73 bpm, left axis deviation, low voltage, poor R wave progression along the precordial leads, no acute st/t changes Telemetry:  Telemetry was personally reviewed and demonstrates: sinus bradycardia/sinus rhythm  Weights: Filed Weights   05/17/21 0836 05/18/21 0457  Weight: 97.5 kg 98 kg    Relevant CV Studies:  2D echo 05/17/2021: 1. Left ventricular ejection fraction, by estimation, is 55 to 60%. The  left ventricle has normal function. The left ventricle has no regional  wall motion abnormalities. Left ventricular diastolic parameters are  consistent with Grade I diastolic  dysfunction (impaired relaxation).   2. Right ventricular systolic function is normal. The right ventricular  size is normal. Tricuspid regurgitation signal is inadequate for assessing  PA pressure.   3. Left atrial size was mildly dilated. __________  2D echo 12/10/2020 [name]): EF > 55%, normal LV cavity size, diastolic function, mild biatrial enlargement, mild mitral regurgitation, and a mildly dilated root  Laboratory Data:  Chemistry Recent  Labs  Lab 05/17/21 0840 05/18/21 0424  NA 139 138  K 4.3 4.1  CL 106 106  CO2 24 26  GLUCOSE 112* 112*  BUN 15 13  CREATININE 1.24 0.98  CALCIUM 9.3 8.8*  GFRNONAA >60 >60  ANIONGAP 9 6    No results for input(s): PROT, ALBUMIN, AST, ALT, ALKPHOS, BILITOT in the last 168 hours. Hematology Recent Labs  Lab 05/17/21 0840 05/18/21 0424  WBC 7.0 6.8  RBC 4.76 4.45  HGB 14.6 13.5  HCT 42.4 39.3  MCV 89.1 88.3  MCH 30.7 30.3  MCHC 34.4 34.4  RDW 13.2 13.1  PLT 173 156   Cardiac EnzymesNo results for input(s): TROPONINI in the last 168 hours. No results for input(s): TROPIPOC in the last 168 hours.  BNP Recent Labs  Lab 05/17/21 0840  BNP 112.4*    DDimer No results for input(s): DDIMER in the last 168 hours.  Radiology/Studies:  DG Chest 2 View  Result Date: 05/17/2021 IMPRESSION: No active cardiopulmonary disease. Electronically Signed   By: Marijo Conception M.D.   On: 05/17/2021 09:20   Assessment and Plan:   1. NSTEMI: -Currently chest pain free -High risk of underlying CAD with extensive tobacco use history of 0.5-1 pack daily for 50 years  -High sensitivity troponin has trended to 1322, cycle until peak -Heparin gtt -ASA -Nitro paste for now, discontinue post cath -NPO -Echo with preserved LVSF and normal wall motion as above -LHC today -LDL 93 this admission, if he is found to have CAD on LHC, goal LDL would be < 70 -Start Lipitor 80 mg -Risks and benefits of cardiac catheterization have been discussed with the patient including risks of bleeding, bruising, infection, kidney damage, stroke, heart attack, and death. The patient understands these risks and is willing to proceed with the procedure. All questions have been answered and concerns listened to  2. COPD with ongoing tobacco use: -Complete cessation of tobacco recommended -No active exacerbation  -Ongoing management of COPD per PCP  3. Hyperglycemia: -Check A1c for further risk stratification     For questions or updates, please contact [place] Please consult [email or web address] for contact info under Cardiology/STEMI.   Signed, Christell Faith, PA-C [place] Pager: [phone] 05/18/2021, 8:12 AM

## 2021-05-18 NOTE — Plan of Care (Signed)

## 2021-05-18 NOTE — Progress Notes (Signed)
ANTICOAGULATION CONSULT NOTE   Pharmacy Consult for heparin Indication: chest pain/ACS  No Known Allergies  Patient Measurements: Height: '6\' 1"'$  (185.4 cm) Weight: 98 kg (216 lb 1.6 oz) IBW/kg (Calculated) : 79.9 Heparin Dosing Weight: 97.5 kg  Vital Signs: Temp: 98.1 F (36.7 C) (08/29 0115) Temp Source: Oral (08/29 0115) BP: 121/78 (08/29 0115) Pulse Rate: 69 (08/29 0115)  Labs: Recent Labs    05/17/21 0840 05/17/21 1102 05/17/21 1537 05/17/21 2054 05/18/21 0424  HGB 14.6  --   --   --  13.5  HCT 42.4  --   --   --  39.3  PLT 173  --   --   --  156  APTT  --   --  120*  --   --   LABPROT  --   --  14.0  --   --   INR  --   --  1.1  --   --   HEPARINUNFRC  --   --   --  0.23* 0.28*  CREATININE 1.24  --   --   --  0.98  TROPONINIHS 1,086* 1,322*  --   --   --      Estimated Creatinine Clearance: 90.1 mL/min (by C-G formula based on SCr of 0.98 mg/dL).   Medical History: Past Medical History:  Diagnosis Date   Arthritis    COPD (chronic obstructive pulmonary disease) ([place])    Depression    Emphysema of lung ([facility])    Gout    left knee    Medications:  Medications Prior to Admission  Medication Sig Dispense Refill Last Dose   allopurinol (ZYLOPRIM) 100 MG tablet Take 2 tab po daily 60 tablet 3 05/16/2021 at 2200   buPROPion (WELLBUTRIN XL) 150 MG 24 hr tablet Take 1 tablet (150 mg total) by mouth daily. 90 tablet 1 05/16/2021 at 2200   Fluticasone-Umeclidin-Vilant (TRELEGY ELLIPTA) 100-62.5-25 MCG/INH AEPB Inhale 1 puff into the lungs daily. 1 each 11 Past Month   indomethacin (INDOCIN) 50 MG capsule Take 50 mg by mouth at bedtime.   05/16/2021 at 2200   meloxicam (MOBIC) 7.5 MG tablet TAKE 1 TABLET(7.5 MG) BY MOUTH DAILY 30 tablet 3 05/16/2021 at 2200   sildenafil (VIAGRA) 100 MG tablet Take 1 tablet (100 mg total) by mouth daily as needed for erectile dysfunction. 10 tablet 0 05/16/2021 at 2200   tamsulosin (FLOMAX) 0.4 MG CAPS capsule Take 0.4 mg by mouth  daily.   05/16/2021 at 2200   tolterodine (DETROL LA) 4 MG 24 hr capsule Take 4 mg by mouth daily.   05/16/2021 at 2200   albuterol (VENTOLIN HFA) 108 (90 Base) MCG/ACT inhaler Inhale 2 puffs into the lungs every 6 (six) hours as needed for wheezing or shortness of breath. 8 g 2 unknown at prn   busPIRone (BUSPAR) 10 MG tablet Take 1 tablet (10 mg total) by mouth 2 (two) times daily. (Patient not taking: Reported on 05/17/2021) 60 tablet 1 Not Taking   ibuprofen (ADVIL) 800 MG tablet Take 800 mg by mouth 2 (two) times daily as needed. (Patient not taking: No sig reported)   Not Taking   ondansetron (ZOFRAN ODT) 4 MG disintegrating tablet Take 1 tablet (4 mg total) by mouth every 8 (eight) hours as needed for nausea or vomiting. (Patient not taking: Reported on 05/17/2021) 20 tablet 0 Not Taking   varenicline (CHANTIX PAK) 0.5 MG X 11 & 1 MG X 42 tablet Take one 0.5 mg tablet  by mouth once daily for 3 days, then increase to one 0.5 mg tablet twice daily for 4 days, then increase to one 1 mg tablet twice daily. (Patient not taking: Reported on 05/17/2021) 53 tablet 1 Not Taking   WIXELA INHUB 100-50 MCG/ACT AEPB Inhale 1 puff into the lungs 2 (two) times daily. (Patient not taking: No sig reported)   Not Taking   Scheduled:   allopurinol  100 mg Oral QHS   aspirin EC  81 mg Oral Daily   buPROPion  150 mg Oral QHS   fesoterodine  8 mg Oral Daily   fluticasone furoate-vilanterol  1 puff Inhalation Daily   nitroGLYCERIN  0.5 inch Topical Q6H   tamsulosin  0.4 mg Oral Daily   umeclidinium bromide  1 puff Inhalation Daily   Infusions:   heparin 1,450 Units/hr (05/18/21 0437)   PRN: acetaminophen, albuterol, nicotine, ondansetron (ZOFRAN) IV Anti-infectives (From admission, onward)    None       Assessment: 65YOM presenting with chest pain. Baseline CBC stable with Hgb 14.6, and platelets 173. Not on anticoagulation at home.  Baseline aPTT 120s  8/28 2054 HL = 0.23, subtherapeutic 1250 units/hr  > 1450 units/hr   Goal of Therapy:  Heparin level 0.3-0.7 units/ml Monitor platelets by anticoagulation protocol: Yes   Plan:  8/28 2054 HL = 0.23, subtherapeutic 8/29 0424 HL = 0.28, subtherapeutic  Give 1450 units bolus x 1 Increase heparin infusion to 1650 units/hr Heparin level in 6 hours following rate change  Daily CBC.  Renda Rolls, PharmD, [facility] 05/18/2021 5:06 AM

## 2021-05-18 NOTE — Progress Notes (Signed)
ANTICOAGULATION CONSULT NOTE   Pharmacy Consult for heparin Indication: chest pain/ACS  No Known Allergies  Patient Measurements: Height: '6\' 1"'$  (185.4 cm) Weight: 98 kg (216 lb 1.6 oz) IBW/kg (Calculated) : 79.9 Heparin Dosing Weight: 97.5 kg  Vital Signs: Temp: 98.1 F (36.7 C) (08/29 1153) Temp Source: Oral (08/29 0528) BP: 112/85 (08/29 1153) Pulse Rate: 71 (08/29 1153)  Labs: Recent Labs    05/17/21 0840 05/17/21 1102 05/17/21 1537 05/17/21 2054 05/18/21 0424 05/18/21 1104  HGB 14.6  --   --   --  13.5  --   HCT 42.4  --   --   --  39.3  --   PLT 173  --   --   --  156  --   APTT  --   --  120*  --   --   --   LABPROT  --   --  14.0  --   --   --   INR  --   --  1.1  --   --   --   HEPARINUNFRC  --   --   --  0.23* 0.28* 0.69  CREATININE 1.24  --   --   --  0.98  --   TROPONINIHS 1,086* 1,322*  --   --   --  674*     Estimated Creatinine Clearance: 90.1 mL/min (by C-G formula based on SCr of 0.98 mg/dL).   Medical History: Past Medical History:  Diagnosis Date   Arthritis    COPD (chronic obstructive pulmonary disease) ([place])    Depression    Emphysema of lung ([facility])    Gout    left knee    Medications:  Medications Prior to Admission  Medication Sig Dispense Refill Last Dose   allopurinol (ZYLOPRIM) 100 MG tablet Take 2 tab po daily 60 tablet 3 05/16/2021 at 2200   buPROPion (WELLBUTRIN XL) 150 MG 24 hr tablet Take 1 tablet (150 mg total) by mouth daily. 90 tablet 1 05/16/2021 at 2200   Fluticasone-Umeclidin-Vilant (TRELEGY ELLIPTA) 100-62.5-25 MCG/INH AEPB Inhale 1 puff into the lungs daily. 1 each 11 Past Month   indomethacin (INDOCIN) 50 MG capsule Take 50 mg by mouth at bedtime.   05/16/2021 at 2200   meloxicam (MOBIC) 7.5 MG tablet TAKE 1 TABLET(7.5 MG) BY MOUTH DAILY 30 tablet 3 05/16/2021 at 2200   sildenafil (VIAGRA) 100 MG tablet Take 1 tablet (100 mg total) by mouth daily as needed for erectile dysfunction. 10 tablet 0 05/16/2021 at 2200    tamsulosin (FLOMAX) 0.4 MG CAPS capsule Take 0.4 mg by mouth daily.   05/16/2021 at 2200   tolterodine (DETROL LA) 4 MG 24 hr capsule Take 4 mg by mouth daily.   05/16/2021 at 2200   albuterol (VENTOLIN HFA) 108 (90 Base) MCG/ACT inhaler Inhale 2 puffs into the lungs every 6 (six) hours as needed for wheezing or shortness of breath. 8 g 2 unknown at prn   busPIRone (BUSPAR) 10 MG tablet Take 1 tablet (10 mg total) by mouth 2 (two) times daily. (Patient not taking: Reported on 05/17/2021) 60 tablet 1 Not Taking   ibuprofen (ADVIL) 800 MG tablet Take 800 mg by mouth 2 (two) times daily as needed. (Patient not taking: No sig reported)   Not Taking   ondansetron (ZOFRAN ODT) 4 MG disintegrating tablet Take 1 tablet (4 mg total) by mouth every 8 (eight) hours as needed for nausea or vomiting. (Patient not taking: Reported on 05/17/2021)  20 tablet 0 Not Taking   varenicline (CHANTIX PAK) 0.5 MG X 11 & 1 MG X 42 tablet Take one 0.5 mg tablet by mouth once daily for 3 days, then increase to one 0.5 mg tablet twice daily for 4 days, then increase to one 1 mg tablet twice daily. (Patient not taking: Reported on 05/17/2021) 53 tablet 1 Not Taking   WIXELA INHUB 100-50 MCG/ACT AEPB Inhale 1 puff into the lungs 2 (two) times daily. (Patient not taking: No sig reported)   Not Taking   Scheduled:   allopurinol  100 mg Oral QHS   aspirin EC  81 mg Oral Daily   atorvastatin  80 mg Oral Daily   buPROPion  150 mg Oral QHS   fesoterodine  8 mg Oral Daily   fluticasone furoate-vilanterol  1 puff Inhalation Daily   nicotine  21 mg Transdermal Daily   tamsulosin  0.4 mg Oral Daily   umeclidinium bromide  1 puff Inhalation Daily   Infusions:   heparin 1,650 Units/hr (05/18/21 0559)   PRN: acetaminophen, albuterol, nicotine, ondansetron (ZOFRAN) IV Anti-infectives (From admission, onward)    None       Assessment: [name] [name] presenting with chest pain. Baseline CBC stable with Hgb 14.6, and platelets 173. Not on  anticoagulation at home.  Baseline aPTT 120s  8/28 2054 HL = 0.23, subtherapeutic 1250 units/hr > 1450 units/hr   Goal of Therapy:  Heparin level 0.3-0.7 units/ml Monitor platelets by anticoagulation protocol: Yes   Plan:  8/28 2054 HL = 0.23, subtherapeutic 8/29 0424 HL = 0.28, subtherapeutic 8/29 1104 HL =  0.69 thera, cont 1650 u/hr   Continue heparin infusion at 1650 units/hr Confirmatory Heparin level in 6 hours  Daily CBC.  Chinita Greenland PharmD Clinical Pharmacist 05/18/2021

## 2021-05-18 NOTE — Progress Notes (Signed)
[facility] NAME: Abdulah Swilley    MR#:  [id number]  DATE OF BIRTH:  1956/05/17  SUBJECTIVE:  patient came in with substernal chest pain last night. Chronic smoker. Denies any chest pain today. No family at bedside. Seen by cardiology earlier  REVIEW OF SYSTEMS:   Review of Systems  Constitutional:  Negative for chills, fever and weight loss.  HENT:  Negative for ear discharge, ear pain and nosebleeds.   Eyes:  Negative for blurred vision, pain and discharge.  Respiratory:  Negative for sputum production, shortness of breath, wheezing and stridor.   Cardiovascular:  Negative for chest pain, palpitations, orthopnea and PND.  Gastrointestinal:  Negative for abdominal pain, diarrhea, nausea and vomiting.  Genitourinary:  Negative for frequency and urgency.  Musculoskeletal:  Negative for back pain and joint pain.  Neurological:  Negative for sensory change, speech change, focal weakness and weakness.  Psychiatric/Behavioral:  Negative for depression and hallucinations. The patient is not nervous/anxious.   Tolerating Diet:npo Tolerating PT: not needed  DRUG ALLERGIES:  No Known Allergies  VITALS:  Blood pressure 112/85, pulse 71, temperature 98.1 F (36.7 C), resp. rate 17, height '6\' 1"'$  (1.854 m), weight 98 kg, SpO2 100 %.  PHYSICAL EXAMINATION:   Physical Exam  GENERAL:  65 y.o.-year-old patient lying in the bed with no acute distress.  LUNGS: Normal breath sounds bilaterally, no wheezing, rales, rhonchi. No use of accessory muscles of respiration.  CARDIOVASCULAR: S1, S2 normal. No murmurs, rubs, or gallops.  ABDOMEN: Soft, nontender, nondistended. Bowel sounds present. No organomegaly or mass.  EXTREMITIES: No cyanosis, clubbing or edema b/l.    NEUROLOGIC:non focal   PSYCHIATRIC:  patient is alert and oriented x 3.  SKIN: No obvious rash, lesion, or ulcer.   LABORATORY PANEL:  CBC Recent Labs  Lab  05/18/21 0424  WBC 6.8  HGB 13.5  HCT 39.3  PLT 156    Chemistries  Recent Labs  Lab 05/18/21 0424  NA 138  K 4.1  CL 106  CO2 26  GLUCOSE 112*  BUN 13  CREATININE 0.98  CALCIUM 8.8*   Cardiac Enzymes No results for input(s): TROPONINI in the last 168 hours. RADIOLOGY:  DG Chest 2 View  Result Date: 05/17/2021 CLINICAL DATA:  Chest pain. EXAM: CHEST - 2 VIEW COMPARISON:  None. FINDINGS: The heart size and mediastinal contours are within normal limits. Both lungs are clear. The visualized skeletal structures are unremarkable. IMPRESSION: No active cardiopulmonary disease. Electronically Signed   By: Marijo Conception M.D.   On: 05/17/2021 09:20   ECHOCARDIOGRAM COMPLETE  Result Date: 05/17/2021    ECHOCARDIOGRAM REPORT   Patient Name:   CADYN SHADOWENS Date of Exam: 05/17/2021 Medical Rec #:  [id number]         Height:       73.0 in Accession #:    [id number]        Weight:       215.0 lb Date of Birth:  1955-12-22         BSA:          2.219 m Patient Age:    65 years          BP:           134/83 mmHg Patient Gender: M                 HR:  60 bpm. Exam Location:  [facility] Procedure: 2D Echo Indications:     NSTEMI  History:         Patient has prior history of Echocardiogram examinations. COPD                  and Emphysema.  Sonographer:     Wallace Keller Thornton-Maynard Referring Phys:  [id number] AMY N COX Diagnosing Phys: Ida Rogue MD IMPRESSIONS  1. Left ventricular ejection fraction, by estimation, is 55 to 60%. The left ventricle has normal function. The left ventricle has no regional wall motion abnormalities. Left ventricular diastolic parameters are consistent with Grade I diastolic dysfunction (impaired relaxation).  2. Right ventricular systolic function is normal. The right ventricular size is normal. Tricuspid regurgitation signal is inadequate for assessing PA pressure.  3. Left atrial size was mildly dilated. FINDINGS  Left Ventricle: Left ventricular ejection  fraction, by estimation, is 55 to 60%. The left ventricle has normal function. The left ventricle has no regional wall motion abnormalities. The left ventricular internal cavity size was normal in size. There is  no left ventricular hypertrophy. Left ventricular diastolic parameters are consistent with Grade I diastolic dysfunction (impaired relaxation). Right Ventricle: The right ventricular size is normal. No increase in right ventricular wall thickness. Right ventricular systolic function is normal. Tricuspid regurgitation signal is inadequate for assessing PA pressure. Left Atrium: Left atrial size was mildly dilated. Right Atrium: Right atrial size was normal in size. Pericardium: There is no evidence of pericardial effusion. Mitral Valve: The mitral valve is normal in structure. No evidence of mitral valve regurgitation. No evidence of mitral valve stenosis. Tricuspid Valve: The tricuspid valve is normal in structure. Tricuspid valve regurgitation is not demonstrated. No evidence of tricuspid stenosis. Aortic Valve: The aortic valve was not well visualized. Aortic valve regurgitation is not visualized. No aortic stenosis is present. Aortic valve mean gradient measures 3.0 mmHg. Aortic valve peak gradient measures 6.4 mmHg. Aortic valve area, by VTI measures 3.09 cm. Pulmonic Valve: The pulmonic valve was normal in structure. Pulmonic valve regurgitation is not visualized. No evidence of pulmonic stenosis. Aorta: The aortic root is normal in size and structure. Venous: The inferior vena cava is normal in size with greater than 50% respiratory variability, suggesting right atrial pressure of 3 mmHg. IAS/Shunts: No atrial level shunt detected by color flow Doppler.  LEFT VENTRICLE PLAX 2D LVIDd:         5.38 cm  Diastology LVIDs:         4.17 cm  LV e' medial:    3.45 cm/s LV PW:         1.01 cm  LV E/e' medial:  16.9 LV IVS:        0.89 cm  LV e' lateral:   7.13 cm/s LVOT diam:     2.20 cm  LV E/e' lateral: 8.2  LV SV:         82 LV SV Index:   37 LVOT Area:     3.80 cm  RIGHT VENTRICLE RV S prime:     11.00 cm/s TAPSE (M-mode): 2.8 cm LEFT ATRIUM             Index LA diam:        4.10 cm 1.85 cm/m LA Vol (A2C):   40.1 ml 18.07 ml/m LA Vol (A4C):   29.9 ml 13.48 ml/m LA Biplane Vol: 35.0 ml 15.77 ml/m  AORTIC VALVE  PULMONIC VALVE AV Area (Vmax):    2.68 cm    PV Vmax:       0.88 m/s AV Area (Vmean):   2.89 cm    PV Peak grad:  3.1 mmHg AV Area (VTI):     3.09 cm AV Vmax:           126.00 cm/s AV Vmean:          83.700 cm/s AV VTI:            0.266 m AV Peak Grad:      6.4 mmHg AV Mean Grad:      3.0 mmHg LVOT Vmax:         88.80 cm/s LVOT Vmean:        63.600 cm/s LVOT VTI:          0.216 m LVOT/AV VTI ratio: 0.81  AORTA Ao Root diam: 3.70 cm MITRAL VALVE MV Area (PHT): 2.50 cm    SHUNTS MV E velocity: 58.30 cm/s  Systemic VTI:  0.22 m MV A velocity: 61.70 cm/s  Systemic Diam: 2.20 cm MV E/A ratio:  0.94 Ida Rogue MD Electronically signed by Ida Rogue MD Signature Date/Time: 05/17/2021/10:30:17 PM    Final    ASSESSMENT AND PLAN:   Mubarak Pelon is a 65 y.o. male with medical history significant for history of colon polyps, emphysema, presents to the emergency department for chief concerns of chest pain.  He reports when he was laying down on his back at night he felt left arm pain and then chest pain. Sharp pain down his left arm, until he fell asleep The chest pain was in the substernal area, described as tightness.    NSTEMI-Acute No cardiac history -cont asa, statins - Heparin GTT for ACS per pharmacy  - echo reviewed EF 55 to 60%. No regional wall motion abnormality -- UDS negative - Nitroglycerin 0.5 inch topical, every 6 hours - cardiology consultation with Dr. Fletcher Anon. Patient is NPO for cardiac cath this afternoon -- troponin 1086-- 1322--674   Centrilobular emphysema heavy tobacco abuse - Albuterol as needed for shortness of breath and wheezing - nicotine  patch -- sats 99 200% on room air   Depression/anxiety-bupropion    Gout - allopurinol nightly resumed  BPH - tamsulosin 0.4 mg p.o. daily resumed    Procedures: Family communication :none Consults : [facility] cardiology CODE STATUS:  DVT Prophylaxis : Level of care: Progressive Cardiac Status is: Observation  The patient remains OBS appropriate and will d/c before 2 midnights.  Dispo: The patient is from: Home              Anticipated d/c is to: Home              Patient currently is not medically stable to d/c.   Difficult to place patient No        TOTAL TIME TAKING CARE OF THIS PATIENT: 30 minutes.  >50% time spent on counselling and coordination of care  Note: This dictation was prepared with Dragon dictation along with smaller phrase technology. Any transcriptional errors that result from this process are unintentional.  Fritzi Mandes M.D    [facility]   CC: Primary care physician; Jonetta Osgood, NP Patient ID: Jovin Melka, male   DOB: 1955/12/21, 65 y.o.   MRN: [id number]

## 2021-05-19 ENCOUNTER — Encounter: Payer: Self-pay | Admitting: Cardiovascular Disease

## 2021-05-19 DIAGNOSIS — J432 Centrilobular emphysema: Secondary | ICD-10-CM

## 2021-05-19 DIAGNOSIS — I214 Non-ST elevation (NSTEMI) myocardial infarction: Secondary | ICD-10-CM | POA: Diagnosis not present

## 2021-05-19 DIAGNOSIS — I2510 Atherosclerotic heart disease of native coronary artery without angina pectoris: Secondary | ICD-10-CM

## 2021-05-19 DIAGNOSIS — F320 Major depressive disorder, single episode, mild: Secondary | ICD-10-CM | POA: Diagnosis not present

## 2021-05-19 DIAGNOSIS — E785 Hyperlipidemia, unspecified: Secondary | ICD-10-CM

## 2021-05-19 LAB — CBC
HCT: 39.8 % (ref 39.0–52.0)
Hemoglobin: 13.8 g/dL (ref 13.0–17.0)
MCH: 30.7 pg (ref 26.0–34.0)
MCHC: 34.7 g/dL (ref 30.0–36.0)
MCV: 88.6 fL (ref 80.0–100.0)
Platelets: 155 10*3/uL (ref 150–400)
RBC: 4.49 MIL/uL (ref 4.22–5.81)
RDW: 13.2 % (ref 11.5–15.5)
WBC: 6.6 10*3/uL (ref 4.0–10.5)
nRBC: 0 % (ref 0.0–0.2)

## 2021-05-19 MED ORDER — METOPROLOL TARTRATE 25 MG PO TABS: 25.0000 mg | ORAL_TABLET | Freq: Two times a day (BID) | ORAL | 2 refills | Status: DC

## 2021-05-19 MED ORDER — CLOPIDOGREL BISULFATE 75 MG PO TABS: 75.0000 mg | ORAL_TABLET | Freq: Every day | ORAL | 2 refills | Status: DC

## 2021-05-19 MED ORDER — ASPIRIN 81 MG PO TBEC: 81.0000 mg | DELAYED_RELEASE_TABLET | Freq: Every day | ORAL | 11 refills | Status: AC

## 2021-05-19 MED ORDER — ATORVASTATIN CALCIUM 80 MG PO TABS: 80.0000 mg | ORAL_TABLET | Freq: Every day | ORAL | 2 refills | Status: DC

## 2021-05-19 NOTE — Progress Notes (Signed)
Progress Note  Patient Name: [name] [name] Date of Encounter: 05/19/2021  Primary Cardiologist: New to [facility] - consult by Arida  Subjective   No chest pain, dyspnea, palpitations, dizziness, presyncope, or syncope. Has ambulated without issues. Post-cath labs and vitals are stable.   Inpatient Medications    Scheduled Meds:  allopurinol  100 mg Oral QHS   aspirin EC  81 mg Oral Daily   atorvastatin  80 mg Oral Daily   buPROPion  150 mg Oral QHS   clopidogrel  75 mg Oral Q breakfast   enoxaparin (LOVENOX) injection  40 mg Subcutaneous Q24H   fesoterodine  8 mg Oral Daily   fluticasone furoate-vilanterol  1 puff Inhalation Daily   metoprolol tartrate  25 mg Oral BID   nicotine  21 mg Transdermal Daily   sodium chloride flush  3 mL Intravenous Q12H   tamsulosin  0.4 mg Oral Daily   umeclidinium bromide  1 puff Inhalation Daily   Continuous Infusions:  sodium chloride Stopped (05/19/21 0740)   PRN Meds: sodium chloride, acetaminophen, albuterol, nicotine, ondansetron (ZOFRAN) IV, sodium chloride flush   Vital Signs    Vitals:   05/18/21 1950 05/18/21 2353 05/19/21 0527 05/19/21 0714  BP: 118/82 126/81 112/75 118/88  Pulse: 74 (!) 58 (!) 56 64  Resp: '18 20 17 17  '$ Temp: 97.9 F (36.6 C) 98 F (36.7 C) 97.7 F (36.5 C) 97.9 F (36.6 C)  TempSrc: Oral Oral Oral Oral  SpO2: 99% 98% 100% 99%  Weight:      Height:        Intake/Output Summary (Last 24 hours) at 05/19/2021 0947 Last data filed at 05/19/2021 0945 Gross per 24 hour  Intake 1323.41 ml  Output 850 ml  Net 473.41 ml   Filed Weights   05/17/21 0836 05/18/21 0457 05/18/21 1436  Weight: 97.5 kg 98 kg 98 kg    Telemetry    SR with sinus bradycardia - Personally Reviewed  ECG    No new tracings - Personally Reviewed  Physical Exam   GEN: No acute distress.   Neck: No JVD. Cardiac: RRR, no murmurs, rubs, or gallops. Right radial cardiac cath site is without active bleeding, swelling, warmth,  erythema, or TTP. Radial pulst 2+.  Respiratory: Clear to auscultation bilaterally.  GI: Soft, nontender, non-distended.   MS: No edema; No deformity. Neuro:  Alert and oriented x 3; Nonfocal.  Psych: Normal affect.  Labs    Chemistry Recent Labs  Lab 05/17/21 0840 05/18/21 0424  NA 139 138  K 4.3 4.1  CL 106 106  CO2 24 26  GLUCOSE 112* 112*  BUN 15 13  CREATININE 1.24 0.98  CALCIUM 9.3 8.8*  GFRNONAA >60 >60  ANIONGAP 9 6     Hematology Recent Labs  Lab 05/17/21 0840 05/18/21 0424 05/19/21 0344  WBC 7.0 6.8 6.6  RBC 4.76 4.45 4.49  HGB 14.6 13.5 13.8  HCT 42.4 39.3 39.8  MCV 89.1 88.3 88.6  MCH 30.7 30.3 30.7  MCHC 34.4 34.4 34.7  RDW 13.2 13.1 13.2  PLT 173 156 155    Cardiac EnzymesNo results for input(s): TROPONINI in the last 168 hours. No results for input(s): TROPIPOC in the last 168 hours.   BNP Recent Labs  Lab 05/17/21 0840  BNP 112.4*     DDimer No results for input(s): DDIMER in the last 168 hours.   Radiology    CXR 05/17/2021: IMPRESSION: No active cardiopulmonary disease.  Cardiac Studies  LHC 05/18/2021:   Prox RCA to Mid RCA lesion is 100% stenosed.   Mid LAD lesion is 30% stenosed.   Mid Cx to Dist Cx lesion is 60% stenosed.   1.  Significant two-vessel coronary artery disease.  Chronically occluded proximal to mid right coronary artery with well-developed left-to-right collaterals.  There is also 60% stenosis in the mid to distal left circumflex. 2.  Left ventricular angiography was not performed.  EF was normal by echo. 3.  Normal left ventricular end-diastolic pressure.   Recommendations: No clear culprit is identified for non-STEMI.  The RCA occlusion appears to be chronic with well-developed collaterals.  Cannot completely exclude possible plaque rupture in the mid to distal left circumflex but the lesion does not appear to be obstructive at this point. Recommend medical therapy.  I added clopidogrel for 1  year. __________  2D echo 05/17/2021: 1. Left ventricular ejection fraction, by estimation, is 55 to 60%. The  left ventricle has normal function. The left ventricle has no regional  wall motion abnormalities. Left ventricular diastolic parameters are  consistent with Grade I diastolic  dysfunction (impaired relaxation).   2. Right ventricular systolic function is normal. The right ventricular  size is normal. Tricuspid regurgitation signal is inadequate for assessing  PA pressure.   3. Left atrial size was mildly dilated. __________   2D echo 12/10/2020 [name]): EF > 55%, normal LV cavity size, diastolic function, mild biatrial enlargement, mild mitral regurgitation, and a mildly dilated root  Patient Profile     65 y.o. male with history of COPD with ongoing tobacco use of 0.5 to 1 pack daily for 50 years, gout, and depression who is being seen today for the evaluation of NSTEMI at the request of Dr. Posey Pronto.  Assessment & Plan    1. NSTEMI: -Chest pain free -High sensitivity troponin peaked at 1322 -LHC with 2-vessel CAD medically managed as above -DAPT with ASA and Plavix without interruption for the next 12 months -Aggressive risk factor modification and secondary prevention  -Echo with preserved LVSF and normal wall motion as above -Lipitor -Lopressor -Post-cath instructions -Cardiac rehab   2. COPD with ongoing tobacco use: -Complete cessation of tobacco recommended -No active exacerbation  -Ongoing management of COPD per PCP   3. Hyperglycemia: -A1c 6.1 -Outpatient follow up with PCP  4. HLD: -LDL 93 with goal now < 70 -Continue newly started Lipitor -Follow up fasting lipid panel and LFT in ~ 8 weeks with recommendation to escalate lipid therapy as indicated to achieve target LDL < 70  For questions or updates, please contact [place] Please consult [email or web address] for contact info under Cardiology/STEMI.    Signed, Christell Faith, PA-C [place] Pager: [phone] 05/19/2021, 9:47 AM

## 2021-05-19 NOTE — Discharge Summary (Signed)
[name] [name] at [place] NAME: [name] [name]    MR#:  [id number]  DATE OF BIRTH:  [birth year]  DATE OF ADMISSION:  05/17/2021 ADMITTING PHYSICIAN: Amy N Cox, DO  DATE OF DISCHARGE: 05/19/21  PRIMARY CARE PHYSICIAN: Jonetta Osgood, NP    ADMISSION DIAGNOSIS:  NSTEMI (non-ST elevated myocardial infarction) ([facility]) [I21.4] Other forms of angina pectoris ([place]) [I20.8]  DISCHARGE DIAGNOSIS:  NSTEMI s/p Cath--medical management   SECONDARY DIAGNOSIS:   Past Medical History:  Diagnosis Date   Arthritis    COPD (chronic obstructive pulmonary disease) (HCC)    Depression    Emphysema of lung (HCC)    Gout    left knee    HOSPITAL COURSE:   [name] [name] is a 65 y.o. male with medical history significant for history of colon polyps, emphysema, presents to the emergency department for chief concerns of chest pain.  He reports when he was laying down on his back at night he felt left arm pain and then chest pain. Sharp pain down his left arm, until he fell asleep The chest pain was in the substernal area, described as tightness.      NSTEMI-Acute No cardiac history -cont asa, statins - Heparin GTT for ACS per pharmacy  - echo reviewed EF 55 to 60%. No regional wall motion abnormality -- UDS negative - Nitroglycerin 0.5 inch topical, every 6 hours - cardiology consultation with Dr. Fletcher Anon. Patient is NPO for cardiac cath this afternoon -- troponin 1086-- 1322--674 --8/30-- cath per Dr Fletcher Anon--.  Significant two-vessel coronary artery disease.  Chronically occluded proximal to mid right coronary artery with well-developed left-to-right collaterals.  There is also 60% stenosis in the mid to distal left circumflex. 2.  Left ventricular angiography was not performed.  EF was normal by echo. 3.  Normal left ventricular end-diastolic pressure.   Recommendations: No clear culprit is identified for non-STEMI.  The RCA occlusion appears to be  chronic with well-developed collaterals.  Cannot completely exclude possible plaque rupture in the mid to distal left circumflex but the lesion does not appear to be obstructive at this point. Recommend medical therapy.  added clopidogrel for 1 year.  --pt denies any chest pain --pt on asa, plavix, BB and statins --cardiac rehab as out pt  Centrilobular emphysema heavy tobacco abuse - Albuterol as needed for shortness of breath and wheezing - nicotine patch -- sats 99 200% on room air   Depression/anxiety-bupropion    Gout - allopurinol nightly resumed   BPH - tamsulosin  daily      Procedures: cath Family communication :none Consults : [facility] CODE STATUS:  DVT Prophylaxis : Level of care: Progressive Cardiac Status is: Observation    Dispo: The patient is from: Home              Anticipated d/c is to: Home              Patient currently is medically stable to d/c.              Difficult to place patient No   CONSULTS OBTAINED:  Treatment Team:  Wellington Hampshire, MD  DRUG ALLERGIES:  No Known Allergies  DISCHARGE MEDICATIONS:   Allergies as of 05/19/2021   No Known Allergies      Medication List     STOP taking these medications    busPIRone 10 MG tablet Commonly known as: BUSPAR   ibuprofen 800 MG tablet Commonly known as:  ADVIL   ondansetron 4 MG disintegrating tablet Commonly known as: Zofran ODT   varenicline 0.5 MG X 11 & 1 MG X 42 tablet Commonly known as: CHANTIX PAK       TAKE these medications    albuterol 108 (90 Base) MCG/ACT inhaler Commonly known as: VENTOLIN HFA Inhale 2 puffs into the lungs every 6 (six) hours as needed for wheezing or shortness of breath.   allopurinol 100 MG tablet Commonly known as: ZYLOPRIM Take 2 tab po daily   aspirin 81 MG EC tablet Take 1 tablet (81 mg total) by mouth daily. Swallow whole.   atorvastatin 80 MG tablet Commonly known as: LIPITOR Take 1 tablet (80 mg total) by mouth  daily.   buPROPion 150 MG 24 hr tablet Commonly known as: Wellbutrin XL Take 1 tablet (150 mg total) by mouth daily.   clopidogrel 75 MG tablet Commonly known as: PLAVIX Take 1 tablet (75 mg total) by mouth daily with breakfast.   indomethacin 50 MG capsule Commonly known as: INDOCIN Take 50 mg by mouth at bedtime.   meloxicam 7.5 MG tablet Commonly known as: MOBIC TAKE 1 TABLET(7.5 MG) BY MOUTH DAILY   metoprolol tartrate 25 MG tablet Commonly known as: LOPRESSOR Take 1 tablet (25 mg total) by mouth 2 (two) times daily.   sildenafil 100 MG tablet Commonly known as: VIAGRA Take 1 tablet (100 mg total) by mouth daily as needed for erectile dysfunction.   tamsulosin 0.4 MG Caps capsule Commonly known as: FLOMAX Take 0.4 mg by mouth daily.   tolterodine 4 MG 24 hr capsule Commonly known as: DETROL LA Take 4 mg by mouth daily.   Trelegy Ellipta 100-62.5-25 MCG/INH Aepb Generic drug: Fluticasone-Umeclidin-Vilant Inhale 1 puff into the lungs daily.   Wixela Inhub 100-50 MCG/ACT Aepb Generic drug: fluticasone-salmeterol Inhale 1 puff into the lungs 2 (two) times daily.        If you experience worsening of your admission symptoms, develop shortness of breath, life threatening emergency, suicidal or homicidal thoughts you must seek medical attention immediately by calling 911 or calling your MD immediately  if symptoms less severe.  You Must read complete instructions/literature along with all the possible adverse reactions/side effects for all the Medicines you take and that have been prescribed to you. Take any new Medicines after you have completely understood and accept all the possible adverse reactions/side effects.   Please note  You were cared for by a hospitalist during your hospital stay. If you have any questions about your discharge medications or the care you received while you were in the hospital after you are discharged, you can call the unit and asked to  speak with the hospitalist on call if the hospitalist that took care of you is not available. Once you are discharged, your primary care physician will handle any further medical issues. Please note that NO REFILLS for any discharge medications will be authorized once you are discharged, as it is imperative that you return to your primary care physician (or establish a relationship with a primary care physician if you do not have one) for your aftercare needs so that they can reassess your need for medications and monitor your lab values. Today   SUBJECTIVE  No chest pain   VITAL SIGNS:  Blood pressure 118/88, pulse 64, temperature 97.9 F (36.6 C), temperature source Oral, resp. rate 17, height '6\' 1"'$  (1.854 m), weight 98 kg, SpO2 99 %.  I/O:   Intake/Output Summary (Last 24  hours) at 05/19/2021 0831 Last data filed at 05/19/2021 0700 Gross per 24 hour  Intake 1083.41 ml  Output 1650 ml  Net -566.59 ml    PHYSICAL EXAMINATION:  GENERAL:  65 y.o.-year-old patient lying in the bed with no acute distress.  LUNGS: Normal breath sounds bilaterally, no wheezing, rales,rhonchi or crepitation. No use of accessory muscles of respiration.  CARDIOVASCULAR: S1, S2 normal. No murmurs, rubs, or gallops.  ABDOMEN: Soft, non-tender, non-distended. Bowel sounds present. No organomegaly or mass.  EXTREMITIES: No pedal edema, cyanosis, or clubbing.  NEUROLOGIC: Cranial nerves II through XII are intact. Muscle strength 5/5 in all extremities. Sensation intact. Gait not checked.  PSYCHIATRIC: The patient is alert and oriented x 3.  SKIN: No obvious rash, lesion, or ulcer.   DATA REVIEW:   CBC  Recent Labs  Lab 05/19/21 0344  WBC 6.6  HGB 13.8  HCT 39.8  PLT 155    Chemistries  Recent Labs  Lab 05/18/21 0424  NA 138  K 4.1  CL 106  CO2 26  GLUCOSE 112*  BUN 13  CREATININE 0.98  CALCIUM 8.8*    Microbiology Results   Recent Results (from the past 240 hour(s))  Resp Panel by RT-PCR  (Flu A&B, Covid) Nasopharyngeal Swab     Status: None   Collection Time: 05/17/21  9:57 AM   Specimen: Nasopharyngeal Swab; Nasopharyngeal(NP) swabs in vial transport medium  Result Value Ref Range Status   SARS Coronavirus 2 by RT PCR NEGATIVE NEGATIVE Final    Comment: (NOTE) SARS-CoV-2 target nucleic acids are NOT DETECTED.  The SARS-CoV-2 RNA is generally detectable in upper respiratory specimens during the acute phase of infection. The lowest concentration of SARS-CoV-2 viral copies this assay can detect is 138 copies/mL. A negative result does not preclude SARS-Cov-2 infection and should not be used as the sole basis for treatment or other patient management decisions. A negative result may occur with  improper specimen collection/handling, submission of specimen other than nasopharyngeal swab, presence of viral mutation(s) within the areas targeted by this assay, and inadequate number of viral copies(<138 copies/mL). A negative result must be combined with clinical observations, patient history, and epidemiological information. The expected result is Negative.  Fact Sheet for Patients:  EntrepreneurPulse.com.au  Fact Sheet for Healthcare Providers:  IncredibleEmployment.be  This test is no t yet approved or cleared by the [place] FDA and  has been authorized for detection and/or diagnosis of SARS-CoV-2 by FDA under an Emergency Use Authorization (EUA). This EUA will remain  in effect (meaning this test can be used) for the duration of the COVID-19 declaration under Section 564(b)(1) of the Act, 21 U.S.C.section 360bbb-3(b)(1), unless the authorization is terminated  or revoked sooner.       Influenza A by PCR NEGATIVE NEGATIVE Final   Influenza B by PCR NEGATIVE NEGATIVE Final    Comment: (NOTE) The Xpert Xpress SARS-CoV-2/FLU/RSV plus assay is intended as an aid in the diagnosis of influenza from Nasopharyngeal swab specimens  and should not be used as a sole basis for treatment. Nasal washings and aspirates are unacceptable for Xpert Xpress SARS-CoV-2/FLU/RSV testing.  Fact Sheet for Patients: EntrepreneurPulse.com.au  Fact Sheet for Healthcare Providers: IncredibleEmployment.be  This test is not yet approved or cleared by the [place] FDA and has been authorized for detection and/or diagnosis of SARS-CoV-2 by FDA under an Emergency Use Authorization (EUA). This EUA will remain in effect (meaning this test can be used) for the duration of the COVID-19  declaration under Section 564(b)(1) of the Act, 21 U.S.C. section 360bbb-3(b)(1), unless the authorization is terminated or revoked.  Performed at [facility], [address], [place], [place] [address]     RADIOLOGY:  DG Chest 2 View  Result Date: 05/17/2021 CLINICAL DATA:  Chest pain. EXAM: CHEST - 2 VIEW COMPARISON:  None. FINDINGS: The heart size and mediastinal contours are within normal limits. Both lungs are clear. The visualized skeletal structures are unremarkable. IMPRESSION: No active cardiopulmonary disease. Electronically Signed   By: Marijo Conception M.D.   On: 05/17/2021 09:20   CARDIAC CATHETERIZATION  Result Date: 05/18/2021   Prox RCA to Mid RCA lesion is 100% stenosed.   Mid LAD lesion is 30% stenosed.   Mid Cx to Dist Cx lesion is 60% stenosed. 1.  Significant two-vessel coronary artery disease.  Chronically occluded proximal to mid right coronary artery with well-developed left-to-right collaterals.  There is also 60% stenosis in the mid to distal left circumflex. 2.  Left ventricular angiography was not performed.  EF was normal by echo. 3.  Normal left ventricular end-diastolic pressure. Recommendations: No clear culprit is identified for non-STEMI.  The RCA occlusion appears to be chronic with well-developed collaterals.  Cannot completely exclude possible plaque rupture in the mid to  distal left circumflex but the lesion does not appear to be obstructive at this point. Recommend medical therapy.  I added clopidogrel for 1 year.   ECHOCARDIOGRAM COMPLETE  Result Date: 05/17/2021    ECHOCARDIOGRAM REPORT   Patient Name:   [name] [name] Date of Exam: 05/17/2021 Medical Rec #:  [id number]         Height:       73.0 in Accession #:    [id number]        Weight:       215.0 lb Date of Birth:  06/27/1956         BSA:          2.219 m Patient Age:    22 years          BP:           134/83 mmHg Patient Gender: M                 HR:           60 bpm. Exam Location:  [facility] Procedure: 2D Echo Indications:     NSTEMI  History:         Patient has prior history of Echocardiogram examinations. COPD                  and Emphysema.  Sonographer:     Wallace Keller Thornton-Maynard Referring Phys:  [id number] AMY N COX Diagnosing Phys: Ida Rogue MD IMPRESSIONS  1. Left ventricular ejection fraction, by estimation, is 55 to 60%. The left ventricle has normal function. The left ventricle has no regional wall motion abnormalities. Left ventricular diastolic parameters are consistent with Grade I diastolic dysfunction (impaired relaxation).  2. Right ventricular systolic function is normal. The right ventricular size is normal. Tricuspid regurgitation signal is inadequate for assessing PA pressure.  3. Left atrial size was mildly dilated. FINDINGS  Left Ventricle: Left ventricular ejection fraction, by estimation, is 55 to 60%. The left ventricle has normal function. The left ventricle has no regional wall motion abnormalities. The left ventricular internal cavity size was normal in size. There is  no left ventricular hypertrophy. Left ventricular diastolic parameters are consistent with Grade I diastolic  dysfunction (impaired relaxation). Right Ventricle: The right ventricular size is normal. No increase in right ventricular wall thickness. Right ventricular systolic function is normal. Tricuspid regurgitation  signal is inadequate for assessing PA pressure. Left Atrium: Left atrial size was mildly dilated. Right Atrium: Right atrial size was normal in size. Pericardium: There is no evidence of pericardial effusion. Mitral Valve: The mitral valve is normal in structure. No evidence of mitral valve regurgitation. No evidence of mitral valve stenosis. Tricuspid Valve: The tricuspid valve is normal in structure. Tricuspid valve regurgitation is not demonstrated. No evidence of tricuspid stenosis. Aortic Valve: The aortic valve was not well visualized. Aortic valve regurgitation is not visualized. No aortic stenosis is present. Aortic valve mean gradient measures 3.0 mmHg. Aortic valve peak gradient measures 6.4 mmHg. Aortic valve area, by VTI measures 3.09 cm. Pulmonic Valve: The pulmonic valve was normal in structure. Pulmonic valve regurgitation is not visualized. No evidence of pulmonic stenosis. Aorta: The aortic root is normal in size and structure. Venous: The inferior vena cava is normal in size with greater than 50% respiratory variability, suggesting right atrial pressure of 3 mmHg. IAS/Shunts: No atrial level shunt detected by color flow Doppler.  LEFT VENTRICLE PLAX 2D LVIDd:         5.38 cm  Diastology LVIDs:         4.17 cm  LV e' medial:    3.45 cm/s LV PW:         1.01 cm  LV E/e' medial:  16.9 LV IVS:        0.89 cm  LV e' lateral:   7.13 cm/s LVOT diam:     2.20 cm  LV E/e' lateral: 8.2 LV SV:         82 LV SV Index:   37 LVOT Area:     3.80 cm  RIGHT VENTRICLE RV S prime:     11.00 cm/s TAPSE (M-mode): 2.8 cm LEFT ATRIUM             Index LA diam:        4.10 cm 1.85 cm/m LA Vol (A2C):   40.1 ml 18.07 ml/m LA Vol (A4C):   29.9 ml 13.48 ml/m LA Biplane Vol: 35.0 ml 15.77 ml/m  AORTIC VALVE                   PULMONIC VALVE AV Area (Vmax):    2.68 cm    PV Vmax:       0.88 m/s AV Area (Vmean):   2.89 cm    PV Peak grad:  3.1 mmHg AV Area (VTI):     3.09 cm AV Vmax:           126.00 cm/s AV Vmean:           83.700 cm/s AV VTI:            0.266 m AV Peak Grad:      6.4 mmHg AV Mean Grad:      3.0 mmHg LVOT Vmax:         88.80 cm/s LVOT Vmean:        63.600 cm/s LVOT VTI:          0.216 m LVOT/AV VTI ratio: 0.81  AORTA Ao Root diam: 3.70 cm MITRAL VALVE MV Area (PHT): 2.50 cm    SHUNTS MV E velocity: 58.30 cm/s  Systemic VTI:  0.22 m MV A velocity: 61.70 cm/s  Systemic Diam: 2.20 cm MV E/A ratio:  0.94 Ida Rogue MD Electronically signed by Ida Rogue MD Signature Date/Time: 05/17/2021/10:30:17 PM    Final      CODE STATUS:     Code Status Orders  (From admission, onward)           Start     Ordered   05/17/21 1241  Full code  Continuous        05/17/21 1241           Code Status History     This patient has a current code status but no historical code status.        TOTAL TIME TAKING CARE OF THIS PATIENT: 40 minutes.    Fritzi Mandes M.D  [facility]    CC: Primary care physician; Jonetta Osgood, NP

## 2021-05-20 ENCOUNTER — Ambulatory Visit (INDEPENDENT_AMBULATORY_CARE_PROVIDER_SITE_OTHER): Payer: Medicare Other | Admitting: Nurse Practitioner

## 2021-05-20 ENCOUNTER — Encounter: Payer: Self-pay | Admitting: Nurse Practitioner

## 2021-05-20 ENCOUNTER — Other Ambulatory Visit: Payer: Self-pay

## 2021-05-20 VITALS — BP 88/60 | HR 57 | Temp 98.1°F | Resp 16 | Ht 73.0 in | Wt 213.2 lb

## 2021-05-20 DIAGNOSIS — I9589 Other hypotension: Secondary | ICD-10-CM

## 2021-05-20 DIAGNOSIS — I700 Atherosclerosis of aorta: Secondary | ICD-10-CM | POA: Diagnosis not present

## 2021-05-20 DIAGNOSIS — I214 Non-ST elevation (NSTEMI) myocardial infarction: Secondary | ICD-10-CM

## 2021-05-20 DIAGNOSIS — J449 Chronic obstructive pulmonary disease, unspecified: Secondary | ICD-10-CM

## 2021-05-20 MED ORDER — SERTRALINE HCL 25 MG PO TABS: 25.0000 mg | ORAL_TABLET | Freq: Every day | ORAL | 3 refills | Status: DC

## 2021-05-20 MED ORDER — ZOSTER VAC RECOMB ADJUVANTED 50 MCG/0.5ML IM SUSR: 0.5000 mL | Freq: Once | INTRAMUSCULAR | 0 refills | Status: AC

## 2021-05-20 MED ORDER — METOPROLOL TARTRATE 25 MG PO TABS: 12.5000 mg | ORAL_TABLET | Freq: Two times a day (BID) | ORAL | 2 refills | Status: DC

## 2021-05-20 NOTE — Progress Notes (Signed)
[facility] [place], [place] [address]  Internal MEDICINE  Office Visit Note  Patient Name: [name] [name]  [id number]  [id number]  Date of Service: 05/20/2021    Transitional care management    Chief Complaint  Patient presents with   Hospitalization Follow-up    Heart attack    COPD   Depression     HPI Pt is here for recent hospital follow up.  Pt was admitted with CP from 8/28--- /8/30  Dx of NSTEMI cardiac cath   - cardiology consultation with Dr. Fletcher Anon. Patient is NPO for cardiac cath this afternoon -- troponin 1086-- [id number] --8/30-- cath per Dr Fletcher Anon--.  Significant two-vessel coronary artery disease.  Chronically occluded proximal to mid right coronary artery with well-developed left-to-right collaterals.  There is also 60% stenosis in the mid to distal left circumflex. 2.  Left ventricular angiography was not performed.  EF was normal by echo. 3.  Normal left ventricular end-diastolic pressure. On metoprolol and Lipitor along with Plavix. Both blood pressure are heart rate are low Pt does feel tired   Current Medication: Outpatient Encounter Medications as of 05/20/2021  Medication Sig   albuterol (VENTOLIN HFA) 108 (90 Base) MCG/ACT inhaler Inhale 2 puffs into the lungs every 6 (six) hours as needed for wheezing or shortness of breath.   allopurinol (ZYLOPRIM) 100 MG tablet Take 2 tab po daily   aspirin EC 81 MG EC tablet Take 1 tablet (81 mg total) by mouth daily. Swallow whole.   atorvastatin (LIPITOR) 80 MG tablet Take 1 tablet (80 mg total) by mouth daily.   clopidogrel (PLAVIX) 75 MG tablet Take 1 tablet (75 mg total) by mouth daily with breakfast.   Fluticasone-Umeclidin-Vilant (TRELEGY ELLIPTA) 100-62.5-25 MCG/INH AEPB Inhale 1 puff into the lungs daily.   indomethacin (INDOCIN) 50 MG capsule Take 50 mg by mouth at bedtime.   meloxicam (MOBIC) 7.5 MG tablet TAKE 1 TABLET(7.5 MG) BY MOUTH DAILY   sertraline (ZOLOFT) 25 MG  tablet Take 1 tablet (25 mg total) by mouth daily.   sildenafil (VIAGRA) 100 MG tablet Take 1 tablet (100 mg total) by mouth daily as needed for erectile dysfunction.   tamsulosin (FLOMAX) 0.4 MG CAPS capsule Take 0.4 mg by mouth daily.   tolterodine (DETROL LA) 4 MG 24 hr capsule Take 4 mg by mouth daily.   [DISCONTINUED] buPROPion (WELLBUTRIN XL) 150 MG 24 hr tablet Take 1 tablet (150 mg total) by mouth daily.   [DISCONTINUED] metoprolol tartrate (LOPRESSOR) 25 MG tablet Take 1 tablet (25 mg total) by mouth 2 (two) times daily.   [DISCONTINUED] Zoster Vaccine Adjuvanted [facility]) injection Inject 0.5 mLs into the muscle once.   metoprolol tartrate (LOPRESSOR) 25 MG tablet Take 0.5 tablets (12.5 mg total) by mouth 2 (two) times daily.   Zoster Vaccine Adjuvanted [facility]) injection Inject 0.5 mLs into the muscle once for 1 dose.   No facility-administered encounter medications on file as of 05/20/2021.    Surgical History: Past Surgical History:  Procedure Laterality Date   COLONOSCOPY WITH PROPOFOL N/A 04/07/2021   Procedure: COLONOSCOPY WITH PROPOFOL;  Surgeon: Virgel Manifold, MD;  Location: [facility] ENDOSCOPY;  Service: Endoscopy;  Laterality: N/A;   LEFT HEART CATH AND CORONARY ANGIOGRAPHY N/A 05/18/2021   Procedure: LEFT HEART CATH AND CORONARY ANGIOGRAPHY;  Surgeon: Wellington Hampshire, MD;  Location: [place] [facility];  Service: Cardiovascular;  Laterality: N/A;    Medical History: Past Medical History:  Diagnosis Date   Arthritis  COPD (chronic obstructive pulmonary disease) (HCC)    Depression    Emphysema of lung (HCC)    Gout    left knee   Heart attack ([place])     Family History: Family History  Problem Relation Age of Onset   Alcohol abuse Mother    Alcohol abuse Father    Alcohol abuse Brother     Social History   Socioeconomic History   Marital status: Married    Spouse name: Not on file   Number of children: Not on file   Years of education: Not on  file   Highest education level: Not on file  Occupational History   Not on file  Tobacco Use   Smoking status: Every Day    Packs/day: 2.00    Years: 52.00    Pack years: 104.00    Types: Cigarettes   Smokeless tobacco: Never   Tobacco comments:    10 a day currently  Substance and Sexual Activity   Alcohol use: Not Currently   Drug use: Never   Sexual activity: Not on file  Other Topics Concern   Not on file  Social History Narrative   Not on file   Social Determinants of Health   Financial Resource Strain: Not on file  Food Insecurity: Not on file  Transportation Needs: Not on file  Physical Activity: Not on file  Stress: Not on file  Social Connections: Not on file  Intimate Partner Violence: Not on file      Review of Systems  Constitutional:  Positive for fatigue. Negative for chills and unexpected weight change.  HENT:  Positive for postnasal drip. Negative for congestion, rhinorrhea, sneezing and sore throat.   Eyes:  Negative for redness.  Respiratory:  Negative for cough, chest tightness and shortness of breath.   Cardiovascular:  Negative for chest pain and palpitations.  Gastrointestinal:  Negative for abdominal pain, constipation, diarrhea, nausea and vomiting.  Genitourinary:  Negative for dysuria and frequency.  Musculoskeletal:  Negative for arthralgias, back pain, joint swelling and neck pain.  Skin:  Negative for rash.  Neurological:  Positive for weakness. Negative for tremors and numbness.  Hematological:  Negative for adenopathy. Does not bruise/bleed easily.  Psychiatric/Behavioral:  Negative for behavioral problems (Depression), sleep disturbance and suicidal ideas. The patient is not nervous/anxious.    Vital Signs: BP (!) 88/60   Pulse (!) 57   Temp 98.1 F (36.7 C)   Resp 16   Ht '6\' 1"'$  (1.854 m)   Wt 213 lb 3.2 oz (96.7 kg)   SpO2 99%   BMI 28.13 kg/m    Physical Exam Constitutional:      Appearance: Normal appearance.  HENT:      Head: Normocephalic and atraumatic.     Nose: Nose normal.     Mouth/Throat:     Mouth: Mucous membranes are moist.     Pharynx: No posterior oropharyngeal erythema.  Eyes:     Extraocular Movements: Extraocular movements intact.     Pupils: Pupils are equal, round, and reactive to light.  Cardiovascular:     Pulses: Normal pulses.     Heart sounds: Normal heart sounds.  Pulmonary:     Effort: Pulmonary effort is normal.     Breath sounds: Normal breath sounds.  Neurological:     General: No focal deficit present.     Mental Status: He is alert.  Psychiatric:        Mood and Affect: Mood normal.  Behavior: Behavior normal.      Assessment/Plan: 1. NSTEMI (non-ST elevated myocardial infarction) [facility]) Cardiology Recommendations: No clear culprit is identified for non-STEMI.  The RCA occlusion appears to be chronic with well-developed collaterals.  Cannot completely exclude possible plaque rupture in the mid to distal left circumflex but the lesion does not appear to be obstructive at this point. Recommend medical therapy.  added clopidogrel for 1 year, will monitor blood pressure and HR  2. Hypocalcemia Will repeat. Add calcium and vit D if continues to be low  - Basic Metabolic Panel (BMET)  3. Hypotension, iatrogenic Monitor, will need to discuss with cardio   4. Aortic atherosclerosis (HCC) Continue Lipitor as before, schedule for carotid dopplers   5. Chronic obstructive pulmonary disease, unspecified COPD type ([place]) Controlled with MDI    General Counseling: Jaquae verbalizes understanding of the findings of todays visit and agrees with plan of treatment. I have discussed any further diagnostic evaluation that may be needed or ordered today. We also reviewed his medications today. he has been encouraged to call the office with any questions or concerns that should arise related to todays visit.  All hospital records are reviewed   Counseling:  [place]  Controlled Substance Database was reviewed by me.  Orders Placed This Encounter  Procedures   Basic Metabolic Panel (BMET)      I have reviewed all medical records from hospital follow up including radiology reports and consults from other physicians. Appropriate follow up diagnostics will be scheduled as needed. Patient/ Family understands the plan of treatment. Time spent45 minutes.   Dr Lavera Guise, MD Internal Medicine

## 2021-05-21 LAB — BASIC METABOLIC PANEL
BUN/Creatinine Ratio: 13 (ref 10–24)
BUN: 15 mg/dL (ref 8–27)
CO2: 25 mmol/L (ref 20–29)
Calcium: 9.5 mg/dL (ref 8.6–10.2)
Chloride: 103 mmol/L (ref 96–106)
Creatinine, Ser: 1.18 mg/dL (ref 0.76–1.27)
Glucose: 93 mg/dL (ref 65–99)
Potassium: 4.9 mmol/L (ref 3.5–5.2)
Sodium: 143 mmol/L (ref 134–144)
eGFR: 68 mL/min/{1.73_m2} (ref >59)

## 2021-05-27 IMAGING — US US ABDOMEN COMPLETE
1 series · 14 of 25 positions shown · non-contrast
Comparison: None.

CLINICAL DATA: Periumbilical pain, nausea and vomiting x3 days.

EXAM:
ABDOMEN ULTRASOUND COMPLETE

[Series 1: us abdomen complete · 0.22mm/px · 14 of 85 slices shown]
[im 1/85]
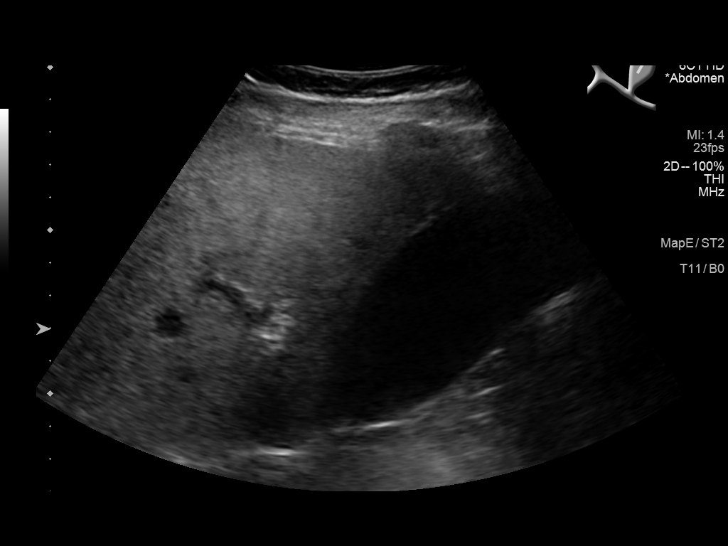
[im 8/85]
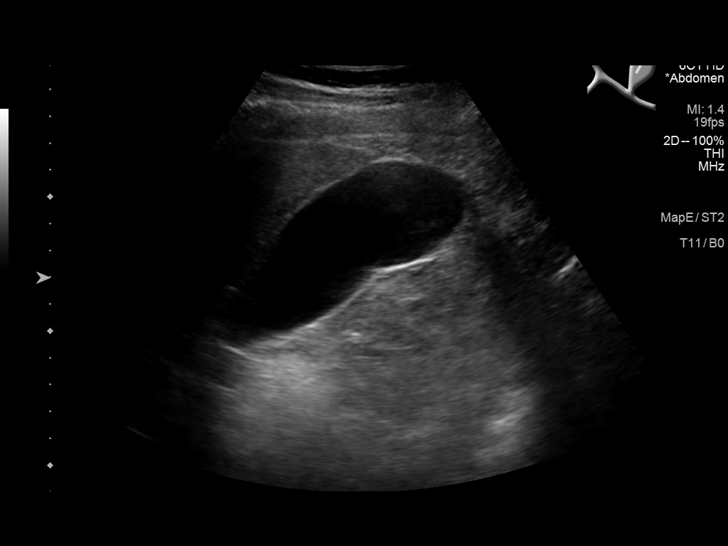
[im 15/85]
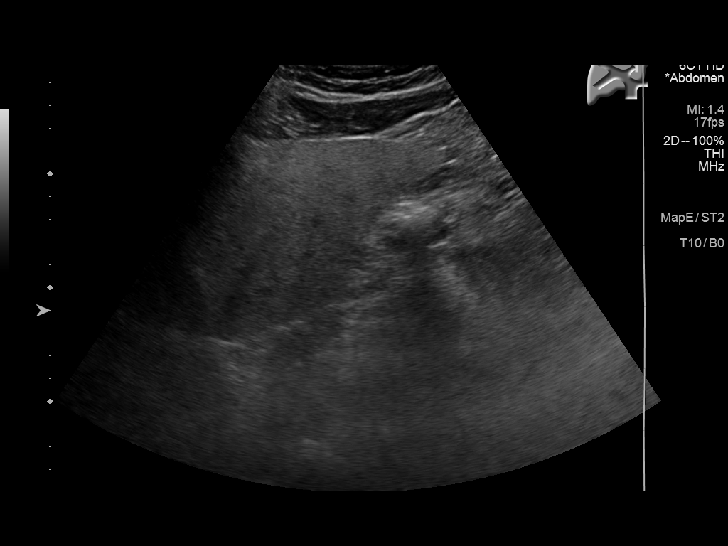
[im 22/85]
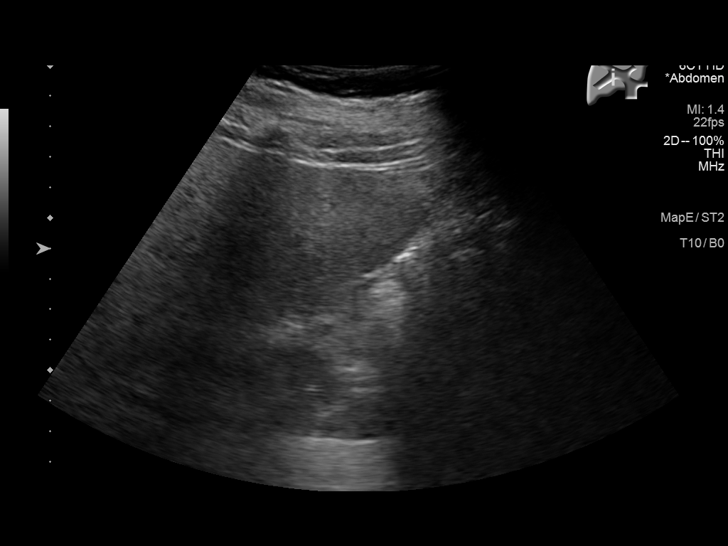
[im 29/85]
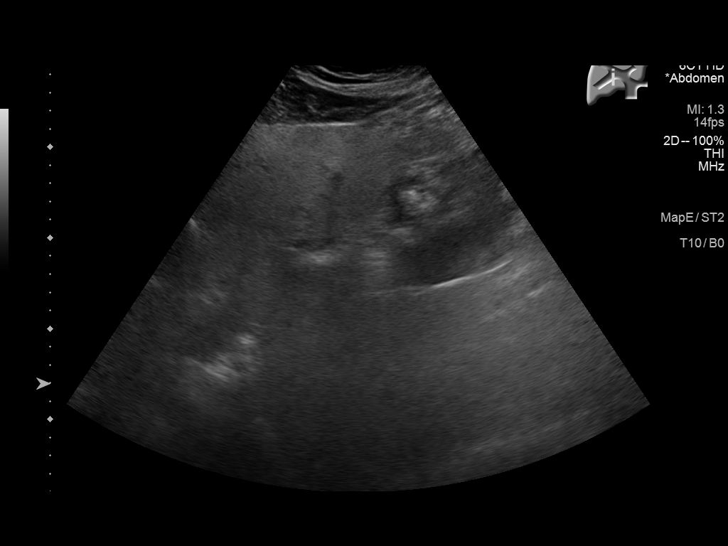
[im 32/85]
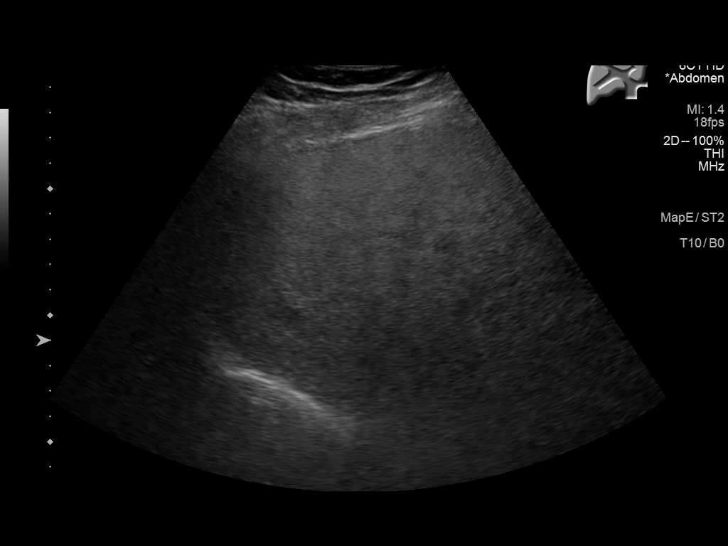
[im 39/85]
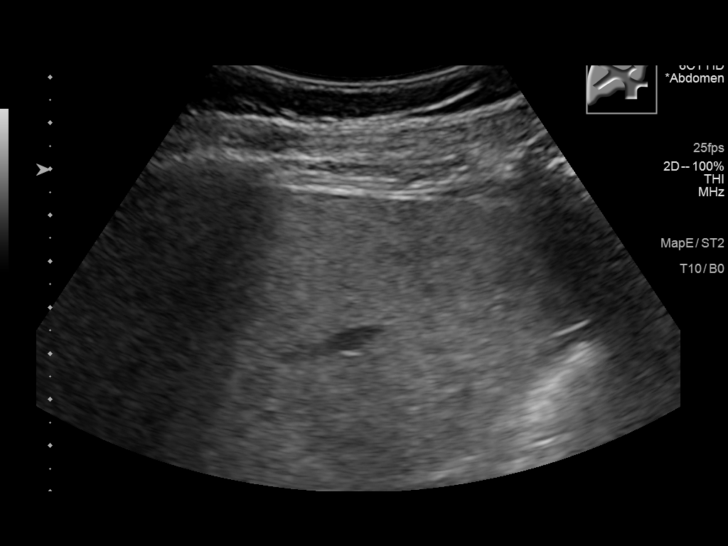
[im 46/85]
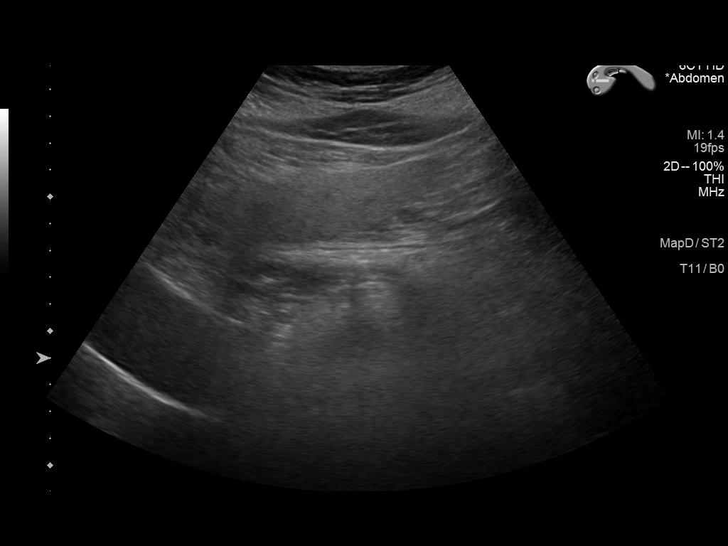
[im 53/85]
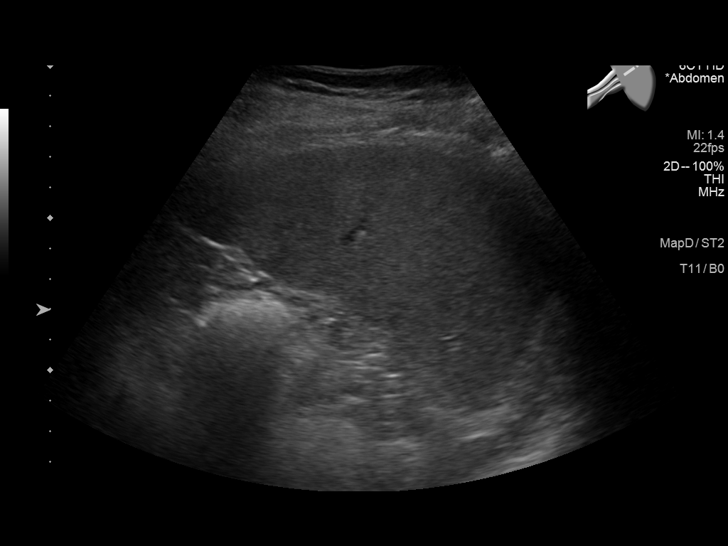
[im 57/85]
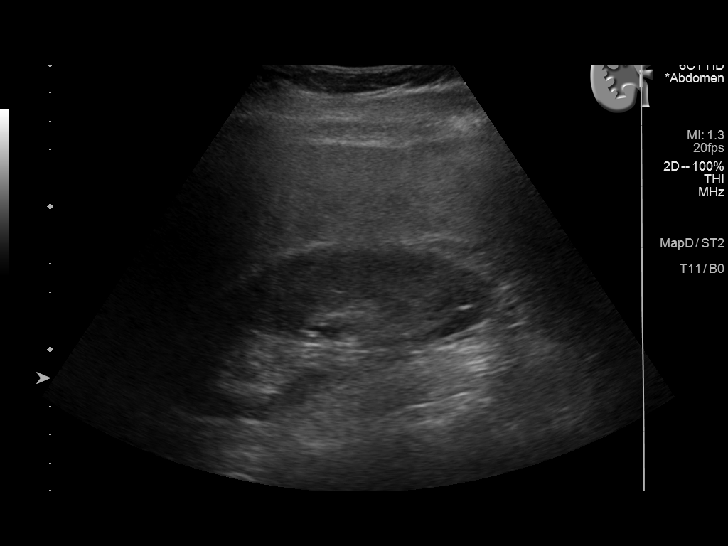
[im 64/85]
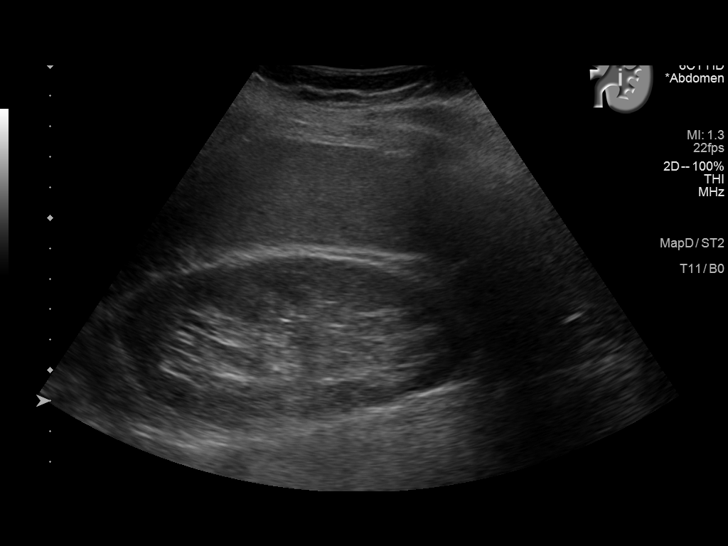
[im 71/85]
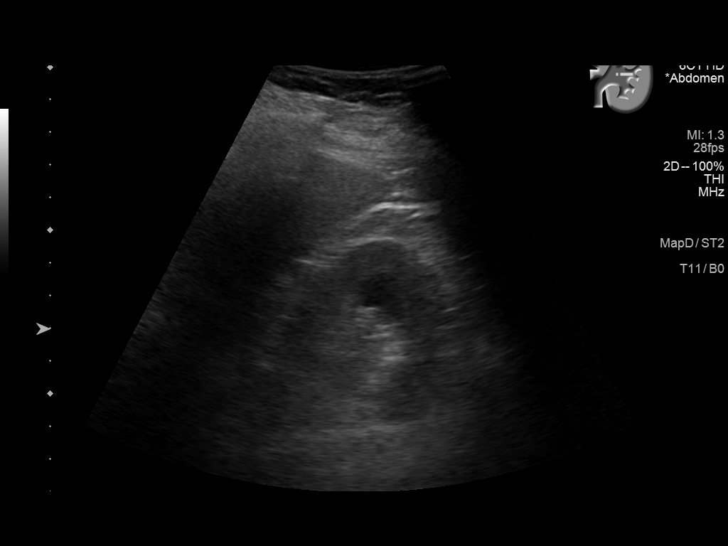
[im 78/85]
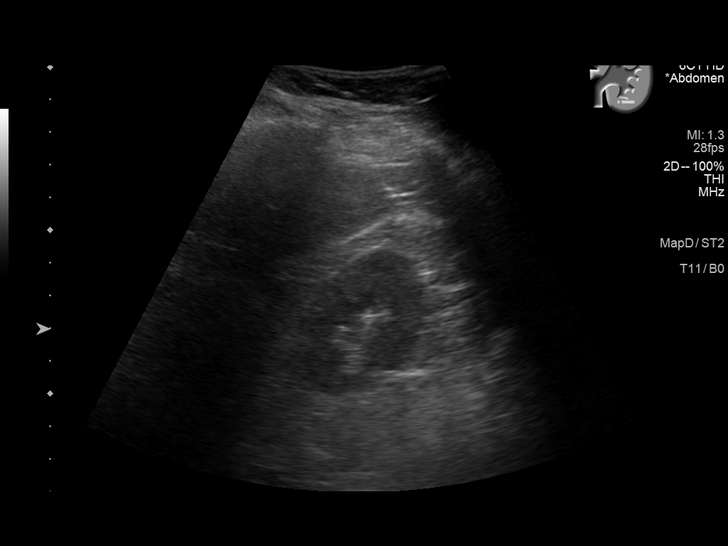
[im 85/85]
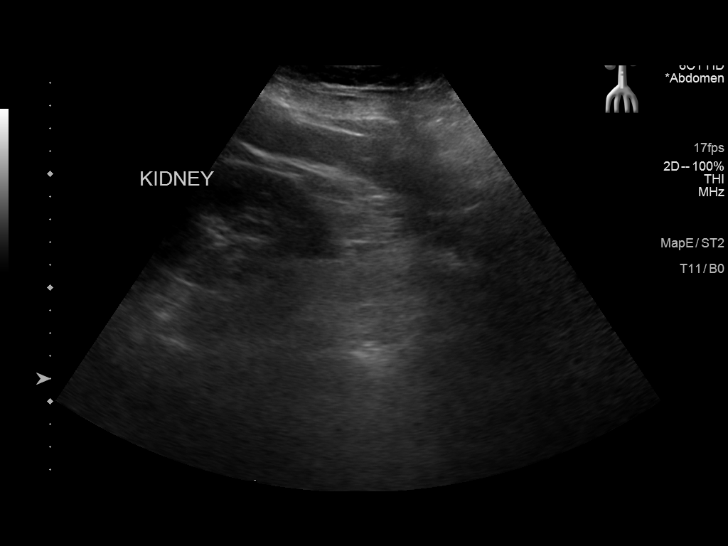

[14 of 25 positions shown; findings below may reference images not displayed]

FINDINGS: Gallbladder: No gallstones or wall thickening visualized. No
sonographic Murphy sign noted by sonographer.

Common bile duct: Diameter: 4 mm

Liver: No focal lesion identified. Diffusely increased parenchymal
echogenicity. Portal vein is patent on color Doppler imaging with
normal direction of blood flow towards the liver.

IVC: No abnormality visualized.

Pancreas: Obscured by overlying bowel gas.

Spleen: Size and appearance within normal limits.

Right Kidney: Length: 12.5 cm. Echogenicity within normal limits. No
mass or hydronephrosis visualized.

Left Kidney: Length: 11.3 cm. Echogenicity within normal limits.
cm renal cyst. No solid mass or hydronephrosis visualized.

Abdominal aorta: No aneurysm visualized.

Other findings: None.
IMPRESSION: 1. The echogenicity of the liver is increased. This is a nonspecific
finding but is most commonly seen with fatty infiltration of the
liver. There are no obvious focal liver lesions.
2. Gallbladder is unremarkable.

## 2021-05-28 ENCOUNTER — Ambulatory Visit: Payer: Medicare Other | Admitting: Medical

## 2021-05-29 ENCOUNTER — Ambulatory Visit: Payer: Medicare Other | Admitting: Nurse Practitioner

## 2021-05-29 ENCOUNTER — Telehealth: Payer: Self-pay

## 2021-05-29 NOTE — Telephone Encounter (Signed)
Left vm to confirm 06/03/21 appointment-Toni

## 2021-06-03 ENCOUNTER — Ambulatory Visit: Payer: Medicare Other

## 2021-06-03 ENCOUNTER — Other Ambulatory Visit: Payer: Self-pay

## 2021-06-03 DIAGNOSIS — I700 Atherosclerosis of aorta: Secondary | ICD-10-CM

## 2021-06-03 DIAGNOSIS — I6529 Occlusion and stenosis of unspecified carotid artery: Secondary | ICD-10-CM

## 2021-06-04 ENCOUNTER — Encounter: Payer: Self-pay | Admitting: Nurse Practitioner

## 2021-06-04 ENCOUNTER — Ambulatory Visit (INDEPENDENT_AMBULATORY_CARE_PROVIDER_SITE_OTHER): Payer: Medicare Other | Admitting: Nurse Practitioner

## 2021-06-04 VITALS — BP 104/70 | HR 60 | Temp 98.0°F | Resp 16 | Ht 73.0 in | Wt 215.2 lb

## 2021-06-04 DIAGNOSIS — G8929 Other chronic pain: Secondary | ICD-10-CM

## 2021-06-04 DIAGNOSIS — F17219 Nicotine dependence, cigarettes, with unspecified nicotine-induced disorders: Secondary | ICD-10-CM | POA: Diagnosis not present

## 2021-06-04 DIAGNOSIS — M25562 Pain in left knee: Secondary | ICD-10-CM | POA: Diagnosis not present

## 2021-06-04 DIAGNOSIS — Z23 Encounter for immunization: Secondary | ICD-10-CM | POA: Diagnosis not present

## 2021-06-04 MED ORDER — VARENICLINE TARTRATE 0.5 MG X 11 & 1 MG X 42 PO MISC: ORAL | 0 refills | Status: DC

## 2021-06-04 MED ORDER — TRAMADOL HCL 50 MG PO TABS
50.0000 mg | ORAL_TABLET | Freq: Two times a day (BID) | ORAL | 0 refills | Status: DC | PRN
Start: 2021-06-04 — End: 2021-09-04

## 2021-06-04 MED ORDER — ZOSTER VAC RECOMB ADJUVANTED 50 MCG/0.5ML IM SUSR: 0.5000 mL | Freq: Once | INTRAMUSCULAR | 0 refills | Status: AC

## 2021-06-04 NOTE — Progress Notes (Signed)
[facility] [place], [place] [address]  Internal MEDICINE  Office Visit Note  Patient Name: [name] [name]  [id number]  [id number]  Date of Service: 06/04/2021  Chief Complaint  Patient presents with   Follow-up    Discuss quitting smoking    Depression   COPD   Anxiety   Quality Metric Gaps    Pt will get records for pneumovax    HPI [name] [name] presents for a follow up visit to discuss smoking cessation. He was on Chantix. During his recent hospitalization, he was instructed to stop taking Chantix. He wants help with smoking cessation because he knows that smoking will only worsen his heart problems.  He is also requesting tramadol for chronic left knee pain due to arthritis.  He is also due for shingles vaccine.    Current Medication: Outpatient Encounter Medications as of 06/04/2021  Medication Sig   albuterol (VENTOLIN HFA) 108 (90 Base) MCG/ACT inhaler Inhale 2 puffs into the lungs every 6 (six) hours as needed for wheezing or shortness of breath.   allopurinol (ZYLOPRIM) 100 MG tablet Take 2 tab po daily   aspirin EC 81 MG EC tablet Take 1 tablet (81 mg total) by mouth daily. Swallow whole.   atorvastatin (LIPITOR) 80 MG tablet Take 1 tablet (80 mg total) by mouth daily.   clopidogrel (PLAVIX) 75 MG tablet Take 1 tablet (75 mg total) by mouth daily with breakfast.   Fluticasone-Umeclidin-Vilant (TRELEGY ELLIPTA) 100-62.5-25 MCG/INH AEPB Inhale 1 puff into the lungs daily.   indomethacin (INDOCIN) 50 MG capsule Take 50 mg by mouth at bedtime.   meloxicam (MOBIC) 7.5 MG tablet TAKE 1 TABLET(7.5 MG) BY MOUTH DAILY   metoprolol tartrate (LOPRESSOR) 25 MG tablet Take 0.5 tablets (12.5 mg total) by mouth 2 (two) times daily.   sertraline (ZOLOFT) 25 MG tablet Take 1 tablet (25 mg total) by mouth daily.   sildenafil (VIAGRA) 100 MG tablet Take 1 tablet (100 mg total) by mouth daily as needed for erectile dysfunction.   tamsulosin (FLOMAX) 0.4 MG CAPS  capsule Take 0.4 mg by mouth daily.   tolterodine (DETROL LA) 4 MG 24 hr capsule Take 4 mg by mouth daily.   traMADol (ULTRAM) 50 MG tablet Take 1 tablet (50 mg total) by mouth every 12 (twelve) hours as needed for moderate pain or severe pain.   varenicline (CHANTIX PAK) 0.5 MG X 11 & 1 MG X 42 tablet Take one 0.5 mg tablet by mouth once daily for 3 days, then increase to one 0.5 mg tablet twice daily for 4 days, then increase to one 1 mg tablet twice daily.   [DISCONTINUED] Zoster Vaccine Adjuvanted [facility]) injection Inject 0.5 mLs into the muscle once.   Zoster Vaccine Adjuvanted [facility]) injection Inject 0.5 mLs into the muscle once for 1 dose.   No facility-administered encounter medications on file as of 06/04/2021.    Surgical History: Past Surgical History:  Procedure Laterality Date   COLONOSCOPY WITH PROPOFOL N/A 04/07/2021   Procedure: COLONOSCOPY WITH PROPOFOL;  Surgeon: Virgel Manifold, MD;  Location: [facility] ENDOSCOPY;  Service: Endoscopy;  Laterality: N/A;   LEFT HEART CATH AND CORONARY ANGIOGRAPHY N/A 05/18/2021   Procedure: LEFT HEART CATH AND CORONARY ANGIOGRAPHY;  Surgeon: Wellington Hampshire, MD;  Location: [place] [facility];  Service: Cardiovascular;  Laterality: N/A;    Medical History: Past Medical History:  Diagnosis Date   Arthritis    COPD (chronic obstructive pulmonary disease) ([place])  Depression    Emphysema of lung ([place])    Gout    left knee   Heart attack ([place])     Family History: Family History  Problem Relation Age of Onset   Alcohol abuse Mother    Alcohol abuse Father    Alcohol abuse Brother     Social History   Socioeconomic History   Marital status: Married    Spouse name: Not on file   Number of children: Not on file   Years of education: Not on file   Highest education level: Not on file  Occupational History   Not on file  Tobacco Use   Smoking status: Every Day    Packs/day: 2.00    Years: 52.00    Pack years: 104.00     Types: Cigarettes   Smokeless tobacco: Never   Tobacco comments:    10 a day currently  Substance and Sexual Activity   Alcohol use: Not Currently   Drug use: Never   Sexual activity: Not on file  Other Topics Concern   Not on file  Social History Narrative   Not on file   Social Determinants of Health   Financial Resource Strain: Not on file  Food Insecurity: Not on file  Transportation Needs: Not on file  Physical Activity: Not on file  Stress: Not on file  Social Connections: Not on file  Intimate Partner Violence: Not on file      Review of Systems  Constitutional:  Negative for chills, fatigue and unexpected weight change.  HENT:  Negative for congestion, rhinorrhea, sneezing and sore throat.   Eyes:  Negative for redness.  Respiratory:  Negative for cough, chest tightness and shortness of breath.   Cardiovascular:  Negative for chest pain and palpitations.  Gastrointestinal:  Negative for abdominal pain, constipation, diarrhea, nausea and vomiting.  Genitourinary:  Negative for dysuria and frequency.  Musculoskeletal:  Positive for arthralgias (left knee pain). Negative for back pain, joint swelling and neck pain.  Skin:  Negative for rash.  Neurological: Negative.  Negative for tremors and numbness.  Hematological:  Negative for adenopathy. Does not bruise/bleed easily.  Psychiatric/Behavioral:  Negative for behavioral problems (Depression), sleep disturbance and suicidal ideas. The patient is not nervous/anxious.    Vital Signs: BP 104/70   Pulse (!) 55   Temp 98 F (36.7 C)   Resp 16   Ht '6\' 1"'$  (1.854 m)   Wt 215 lb 3.2 oz (97.6 kg)   SpO2 98%   BMI 28.39 kg/m    Physical Exam Vitals reviewed.  Constitutional:      General: He is not in acute distress.    Appearance: Normal appearance. He is normal weight. He is not ill-appearing.  HENT:     Head: Normocephalic and atraumatic.  Eyes:     Extraocular Movements: Extraocular movements intact.      Pupils: Pupils are equal, round, and reactive to light.  Cardiovascular:     Rate and Rhythm: Normal rate and regular rhythm.  Pulmonary:     Effort: Pulmonary effort is normal. No respiratory distress.  Neurological:     Mental Status: He is alert and oriented to person, place, and time.     Cranial Nerves: No cranial nerve deficit.     Coordination: Coordination normal.     Gait: Gait normal.  Psychiatric:        Mood and Affect: Mood normal.        Behavior: Behavior normal.  Assessment/Plan: 1. Cigarette nicotine dependence with nicotine-induced disorder Extended studies on risk of cardiovascular events have been done and have shown no clinically significant increased risk of cardiovascular events while taking Chantix. The risk of cardiovascular events was actually lower when compared with nicotine replacement therapy. The medication is only used short term as long as it takes to stop smoking. For these reasons, [name] [name] is ok to take Chantix until he has completed smoking cessation therapy course.  Smoking cessation counseling: Pt acknowledges the risks of long term smoking, he will try to quite smoking. Nicotine replace therapy is not an option due to recent MI. Will resume Chantix.  Goal and date of compete cessation is discussed- approximately 2-3 months Total time spent in smoking cessation is 15 min.  - varenicline (CHANTIX PAK) 0.5 MG X 11 & 1 MG X 42 tablet; Take one 0.5 mg tablet by mouth once daily for 3 days, then increase to one 0.5 mg tablet twice daily for 4 days, then increase to one 1 mg tablet twice daily.  Dispense: 53 tablet; Refill: 0  2. Chronic pain of left knee Left knee pain due to arthritis, he takes tramadol as needed for the pain and he does not need it every day.  - traMADol (ULTRAM) 50 MG tablet; Take 1 tablet (50 mg total) by mouth every 12 (twelve) hours as needed for moderate pain or severe pain.  Dispense: 30 tablet; Refill: 0  3. Need for shingles  vaccine - Zoster Vaccine Adjuvanted [facility]) injection; Inject 0.5 mLs into the muscle once for 1 dose.  Dispense: 0.5 mL; Refill: 0   General Counseling: [name] [name] verbalizes understanding of the findings of todays visit and agrees with plan of treatment. I have discussed any further diagnostic evaluation that may be needed or ordered today. We also reviewed his medications today. he has been encouraged to call the office with any questions or concerns that should arise related to todays visit.    No orders of the defined types were placed in this encounter.   Meds ordered this encounter  Medications   Zoster Vaccine Adjuvanted [facility]) injection    Sig: Inject 0.5 mLs into the muscle once for 1 dose.    Dispense:  0.5 mL    Refill:  0   traMADol (ULTRAM) 50 MG tablet    Sig: Take 1 tablet (50 mg total) by mouth every 12 (twelve) hours as needed for moderate pain or severe pain.    Dispense:  30 tablet    Refill:  0   varenicline (CHANTIX PAK) 0.5 MG X 11 & 1 MG X 42 tablet    Sig: Take one 0.5 mg tablet by mouth once daily for 3 days, then increase to one 0.5 mg tablet twice daily for 4 days, then increase to one 1 mg tablet twice daily.    Dispense:  53 tablet    Refill:  0    Return in about 3 months (around 09/03/2021) for F/U smoking cessation , [name] [name] PCP.   Total time spent:20 Minutes Time spent includes review of chart, medications, test results, and follow up plan with the patient.   [place] Controlled Substance Database was reviewed by me.  This patient was seen by [name] Osgood, FNP-C in collaboration with Dr. Clayborn Bigness as a part of collaborative care agreement.   [name] Asman R. Valetta Fuller, MSN, FNP-C Internal medicine

## 2021-06-06 NOTE — Procedures (Signed)
[place],  [id number]  DATE OF SERVICE: June 03, 2021  CAROTID DOPPLER INTERPRETATION:  Bilateral Carotid Ultrsasound and Color Doppler Examination was performed. The RIGHT CCA shows insignificant plaque in the vessel. The LEFT CCA shows insignificant plaque in the vessel. There was no significant intimal thickening noted in the RIGHT carotid artery. There was no significant intimal thickening in the LEFT carotid artery.  The RIGHT CCA shows peak systolic velocity of 72 cm per second. The end diastolic velocity is 20 cm per second on the RIGHT side. The RIGHT ICA shows peak systolic velocity of 52 per second. RIGHT sided ICA end diastolic velocity is 24 cm per second. The RIGHT ECA shows a peak systolic velocity of 78 cm per second. The ICA/CCA ratio is calculated to be 0.72. This suggests less than 50% stenosis. The Vertebral Artery shows antegrade flow.  The LEFT CCA shows peak systolic velocity of 67 cm per second. The end diastolic velocity is 20 cm per second on the LEFT side. The LEFT ICA shows peak systolic velocity of 66 per second. LEFT sided ICA end diastolic velocity is 25 cm per second. The LEFT ECA shows a peak systolic velocity of 54 cm per second. The ICA/CCA ratio is calculated to be 0.98. This suggests less than 50% stenosis. The Vertebral Artery shows antegrade flow.   Impression:    The RIGHT CAROTID shows less than 50% stenosis. The LEFT CAROTID shows less than 50% stenosis.  There is insignificant plaque formation noted on the LEFT and no significant plaque on the RIGHT  side. Consider a repeat Carotid doppler if clinical situation and symptoms warrant in 6-12 months. Patient should be encouraged to change lifestyles such as smoking cessation, regular exercise and dietary modification. Use of statins in the right clinical setting and ASA is encouraged.  Allyne Gee, MD [facility] Pulmonary Critical Care Medicine

## 2021-06-15 ENCOUNTER — Encounter: Payer: Self-pay | Admitting: Physician Assistant

## 2021-06-15 ENCOUNTER — Ambulatory Visit: Payer: Medicare Other | Admitting: Physician Assistant

## 2021-06-15 ENCOUNTER — Other Ambulatory Visit: Payer: Self-pay

## 2021-06-15 ENCOUNTER — Telehealth: Payer: Self-pay | Admitting: Nurse Practitioner

## 2021-06-15 VITALS — BP 100/60 | HR 48 | Ht 73.0 in | Wt 216.0 lb

## 2021-06-15 DIAGNOSIS — M79661 Pain in right lower leg: Secondary | ICD-10-CM

## 2021-06-15 DIAGNOSIS — F17219 Nicotine dependence, cigarettes, with unspecified nicotine-induced disorders: Secondary | ICD-10-CM | POA: Diagnosis not present

## 2021-06-15 DIAGNOSIS — I2510 Atherosclerotic heart disease of native coronary artery without angina pectoris: Secondary | ICD-10-CM

## 2021-06-15 DIAGNOSIS — I252 Old myocardial infarction: Secondary | ICD-10-CM | POA: Diagnosis not present

## 2021-06-15 NOTE — Chronic Care Management (AMB) (Signed)
  Chronic Care Management   Outreach Note  06/15/2021 Name: [name] [name] MRN: [id number] DOB: October 04, 1955  Referred by: Jonetta Osgood, NP Reason for referral : No chief complaint on file.   An unsuccessful telephone outreach was attempted today. The patient was referred to the pharmacist for assistance with care management and care coordination.   Follow Up Plan:   Tatjana Dellinger Upstream Scheduler

## 2021-06-15 NOTE — Progress Notes (Signed)
Office Visit    Patient Name: [name] [name] Date of Encounter: 06/15/2021  PCP:  Jonetta Osgood, NP   [place]  Cardiologist:  Dr. Fletcher Anon Advanced Practice Provider:  No care team member to display Electrophysiologist:  None  :[id number]}   Chief Complaint    Chief Complaint  Patient presents with   Hospitalization Follow-up    Patient c/o R calf pain that started about one month ago. He states he is currently being treated for gout in his right foot and is not sure whether the pain is associated with gout or could be cardiac related. Would like to discuss.    65 y.o. male with history of CAD s/p recent non-STEMI, COPD, ongoing tobacco use of one half to half pack daily x50 years, gout, depression, and seen today for follow-up of recent non-STEMI.  Past Medical History    Past Medical History:  Diagnosis Date   Arthritis    COPD (chronic obstructive pulmonary disease) ([place])    Depression    Emphysema of lung ([place])    Gout    left knee   Heart attack [facility])    Past Surgical History:  Procedure Laterality Date   CARDIAC CATHETERIZATION     COLONOSCOPY WITH PROPOFOL N/A 04/07/2021   Procedure: COLONOSCOPY WITH PROPOFOL;  Surgeon: Virgel Manifold, MD;  Location: [facility] ENDOSCOPY;  Service: Endoscopy;  Laterality: N/A;   LEFT HEART CATH AND CORONARY ANGIOGRAPHY N/A 05/18/2021   Procedure: LEFT HEART CATH AND CORONARY ANGIOGRAPHY;  Surgeon: Wellington Hampshire, MD;  Location: [facility];  Service: Cardiovascular;  Laterality: N/A;    Allergies  No Known Allergies  History of Present Illness    [name] [name] is a 65 y.o. male with PMH as above.  Prior to his most recent [facility] admission, he had no previously known cardiac history.  He reported previous echo and stress testing in [place] years ago.  11/2020 echo to PCP office 11/2020 showed preserved LV SF.  His cat passed away the week before his admission.  Prior to admission,  he developed substernal chest pain that radiated down his left arm and lasted approximately 15 minutes until he fell asleep.  The following day, he noted some dyspnea.  He presented to [facility] where EKG was without acute ST/T changes and high-sensitivity troponin elevated.  Subsequent LHC 8/29 showed significant two-vessel CAD with chronically occluded proximal to mid RCA and well-developed left-to-right collaterals.  There was 60% stenosis in the mid and distal left circumflex.  LVEF normal by echo.  Normal LVEDP.  No clear culprit was identified for the non-STEMI and suspected due to supply demand mismatch in the setting of fixed CAD involving the left circumflex and RCA..  It was noted possible plaque rupture in the mid to distal left circumflex was not able to be excluded though the lesion did not appear obstructed.  Clopidogrel was added to ASA for at least 1 year.  Today, 06/15/2021, he returns to clinic and notes he is overall doing well from a cardiac standpoint.  His right radial arteriotomy site is without any pain or concerning symptoms.  He denies any chest pain.  He does report ongoing dyspnea on steps.  In addition, he reports right lower extremity calf pain for approximately the past month.  He denies any erythema or warmth.  He does report a long car trip to [facility], though this occurred after the Pain started.  He wonders if it is  connected to his gout.  No presyncope or syncope.  No tachypalpitations.  He is still smoking approximately 10 to 12 cigarettes/day.  He denies any lower extremity edema, abdominal distention, orthopnea, or other symptoms of volume overload.  He reports medication compliance.    Home Medications   Current Outpatient Medications  Medication Instructions   albuterol (VENTOLIN HFA) 108 (90 Base) MCG/ACT inhaler 2 puffs, Inhalation, Every 6 hours PRN   allopurinol (ZYLOPRIM) 100 MG tablet Take 2 tab po daily   aspirin 81 mg, Oral, Daily, Swallow whole.    atorvastatin (LIPITOR) 80 mg, Oral, Daily   buPROPion (WELLBUTRIN SR) 150 mg, Oral, 2 times daily   clopidogrel (PLAVIX) 75 mg, Oral, Daily with breakfast   Fluticasone-Umeclidin-Vilant (TRELEGY ELLIPTA) 100-62.5-25 MCG/INH AEPB 1 puff, Inhalation, Daily   indomethacin (INDOCIN) 50 mg, Oral, Daily at bedtime   meloxicam (MOBIC) 7.5 MG tablet TAKE 1 TABLET(7.5 MG) BY MOUTH DAILY   metoprolol tartrate (LOPRESSOR) 12.5 mg, Oral, 2 times daily   sertraline (ZOLOFT) 25 mg, Oral, Daily   sildenafil (VIAGRA) 100 mg, Oral, Daily PRN   tamsulosin (FLOMAX) 0.4 mg, Oral, Daily   tolterodine (DETROL LA) 4 mg, Oral, Daily   traMADol (ULTRAM) 50 mg, Oral, Every 12 hours PRN   varenicline (CHANTIX) 1 mg, Oral, 2 times daily     Review of Systems    He reports right lower extremity calf pain.  He reports dyspnea, similar to before his catheterization.  He denies chest pain, palpitations, pnd, orthopnea, n, v, dizziness, syncope, edema, weight gain, or early satiety.   All other systems reviewed and are otherwise negative except as noted above.  Physical Exam    VS:  BP 100/60 (BP Location: Left Arm, Patient Position: Sitting, Cuff Size: Large)   Pulse (!) 48   Ht 6\' 1"  (1.854 m)   Wt 216 lb (98 kg)   SpO2 98%   BMI 28.50 kg/m  , BMI Body mass index is 28.5 kg/m. GEN: Well nourished, well developed, in no acute distress. HEENT: normal. Neck: Supple, no JVD, carotid bruits, or masses. Cardiac: RRR, no murmurs, rubs, or gallops. No clubbing, cyanosis, edema.  Radials/DP/PT 2+ and equal bilaterally.  Right radial arteriotomy site without bruit, bleeding, swelling, or tenderness. Respiratory:  Respirations regular and unlabored, clear to auscultation bilaterally. GI: Soft, nontender, nondistended, BS + x 4. MS: no deformity or atrophy. Skin: warm and dry, no rash. Neuro:  Strength and sensation are intact. Psych: Normal affect.  Accessory Clinical Findings    ECG personally reviewed by me today  -sinus bradycardia, 48 bpm, poor R wave progression 2, 3, aVF- no acute changes.  VITALS Reviewed today   Temp Readings from Last 3 Encounters:  06/04/21 98 F (36.7 C)  05/20/21 98.1 F (36.7 C)  05/19/21 97.9 F (36.6 C) (Oral)   BP Readings from Last 3 Encounters:  06/15/21 100/60  06/04/21 104/70  05/20/21 (!) 88/60   Pulse Readings from Last 3 Encounters:  06/15/21 (!) 48  06/04/21 60  05/20/21 (!) 57    Wt Readings from Last 3 Encounters:  06/15/21 216 lb (98 kg)  06/04/21 215 lb 3.2 oz (97.6 kg)  05/20/21 213 lb 3.2 oz (96.7 kg)     LABS  reviewed today   Lab Results  Component Value Date   WBC 6.6 05/19/2021   HGB 13.8 05/19/2021   HCT 39.8 05/19/2021   MCV 88.6 05/19/2021   PLT 155 05/19/2021   Lab Results  Component Value Date   CREATININE 1.18 05/20/2021   BUN 15 05/20/2021   NA 143 05/20/2021   K 4.9 05/20/2021   CL 103 05/20/2021   CO2 25 05/20/2021   Lab Results  Component Value Date   ALT 24 10/31/2020   AST 22 10/31/2020   ALKPHOS 76 10/31/2020   BILITOT 0.3 10/31/2020   Lab Results  Component Value Date   CHOL 142 05/18/2021   HDL 32 (L) 05/18/2021   LDLCALC 93 05/18/2021   TRIG 85 05/18/2021   CHOLHDL 4.4 05/18/2021    Lab Results  Component Value Date   HGBA1C 6.1 (H) 05/18/2021   Lab Results  Component Value Date   TSH 1.198 05/17/2021     STUDIES/PROCEDURES reviewed today    LHC 1.  Significant two-vessel coronary artery disease.  Chronically occluded proximal to mid right coronary artery with well-developed left-to-right collaterals.  There is also 60% stenosis in the mid to distal left circumflex. 2.  Left ventricular angiography was not performed.  EF was normal by echo. 3.  Normal left ventricular end-diastolic pressure. Recommendations: No clear culprit is identified for non-STEMI.  The RCA occlusion appears to be chronic with well-developed collaterals.  Cannot completely exclude possible plaque rupture in the  mid to distal left circumflex but the lesion does not appear to be obstructive at this point. Recommend medical therapy.  I added clopidogrel for 1 year.  Echo 8/28  1. Left ventricular ejection fraction, by estimation, is 55 to 60%. The  left ventricle has normal function. The left ventricle has no regional  wall motion abnormalities. Left ventricular diastolic parameters are  consistent with Grade I diastolic  dysfunction (impaired relaxation).   2. Right ventricular systolic function is normal. The right ventricular  size is normal. Tricuspid regurgitation signal is inadequate for assessing  PA pressure.   3. Left atrial size was mildly dilated.   Assessment & Plan    CAD/history of non-STEMI --No chest pain.  Recent admission with high-sensitivity troponin peaking at 1322.  S/p LHC with two-vessel CAD and no clear culprit for the non-STEMI.  Continue DAPT with ASA and clopidogrel for at least 1 year.  Aggressive risk factor modification, including statin.  Continue Lopressor.  Bradycardia and soft BP noted today and consistent with vitals throughout admission with patient denying any dizziness or presyncope symptoms.  We will continue to monitor.  Discussed cardiac rehab with patient stating that they have yet to reach out to him.  Cath and echo results reviewed.  Right lower extremity calf pain --Recommend lower extremity ultrasound study to rule out DVT at first available slot.  He denies any erythema, current pain, and current tenderness.  He denies any recent car trips, stating his [facility] trip occurred after his pain started.  He does report ongoing tobacco use.  HLD --LDL during admission 93.  Discussed LDL goal of below 70.  Continue current Lipitor.  Recommend repeat lipid and liver function in about 1 month and prior to follow-up visit.  COPD with ongoing tobacco use --Complete cessation of tobacco advised.  Hyperglycemia --Management per PCP   Disposition: RTC 1  month and obtain lipids at that time  *Please be aware that the above documentation was completed voice recognition software and may contain dictation errors.       Arvil Chaco, PA-C 06/15/2021

## 2021-06-15 NOTE — Patient Instructions (Addendum)
Medication Instructions:  - Your physician recommends that you continue on your current medications as directed. Please refer to the Current Medication list given to you today.  *If you need a refill on your cardiac medications before your next appointment, please call your pharmacy*   Lab Work: - Your physician recommends that you return for FASTING lab work in: 1 month (just prior to your follow up appointment)- Lipid/ direct LDL  If you have labs (blood work) drawn today and your tests are completely normal, you will receive your results only by: [place] (if you have MyChart) OR A paper copy in the mail If you have any lab test that is abnormal or we need to change your treatment, we will call you to review the results.   Testing/Procedures: - Your physician has requested that you have a lower extremity venous duplex- ASAP. This test is an ultrasound of the veins in the legs. It looks at venous blood flow that carries blood from the heart to the legs. Allow one hour for a Lower Venous exam.  There are no restrictions or special instructions.   Follow-Up: At [facility], you and your health needs are our priority.  As part of our continuing mission to provide you with exceptional heart care, we have created designated Provider Care Teams.  These Care Teams include your primary Cardiologist (physician) and Advanced Practice Providers (APPs -  Physician Assistants and Nurse Practitioners) who all work together to provide you with the care you need, when you need it.  We recommend signing up for the patient portal called "MyChart".  Sign up information is provided on this After Visit Summary.  MyChart is used to connect with patients for Virtual Visits (Telemedicine).  Patients are able to view lab/test results, encounter notes, upcoming appointments, etc.  Non-urgent messages can be sent to your provider as well.   To learn more about what you can do with MyChart, go to  [email or web address].    Your next appointment:   1 month  The format for your next appointment:   In Person  Provider:   You may see Kathlyn Sacramento, MD or one of the following Advanced Practice Providers on your designated Care Team:   Murray Hodgkins, NP Christell Faith, PA-C Marrianne Mood, PA-C Cadence Kathlen Mody, [place]   Other Instructions N/a

## 2021-06-18 ENCOUNTER — Ambulatory Visit (INDEPENDENT_AMBULATORY_CARE_PROVIDER_SITE_OTHER): Payer: Medicare Other

## 2021-06-18 ENCOUNTER — Other Ambulatory Visit: Payer: Self-pay

## 2021-06-18 DIAGNOSIS — M79661 Pain in right lower leg: Secondary | ICD-10-CM | POA: Diagnosis not present

## 2021-06-22 ENCOUNTER — Telehealth: Payer: Self-pay | Admitting: Nurse Practitioner

## 2021-06-22 NOTE — Chronic Care Management (AMB) (Signed)
  Chronic Care Management   Outreach Note  06/22/2021 Name: [name] [name] MRN: [id number] DOB: 02/23/1956  Referred by: Jonetta Osgood, NP Reason for referral : No chief complaint on file.   A second unsuccessful telephone outreach was attempted today. The patient was referred to pharmacist for assistance with care management and care coordination.  Follow Up Plan:   Tatjana Dellinger Upstream Scheduler

## 2021-06-29 ENCOUNTER — Telehealth: Payer: Self-pay | Admitting: Nurse Practitioner

## 2021-06-29 NOTE — Chronic Care Management (AMB) (Signed)
  Chronic Care Management   Note  06/29/2021 Name: [name] [name] MRN: [id number] DOB: 02/23/1956  [name] [name] is a 65 y.o. year old male who is a primary care patient of Jonetta Osgood, NP. I reached out to [employer] by phone today in response to a referral sent by [name]'s PCP, Jonetta Osgood, NP.   [name] was given information about Chronic Care Management services today including:  CCM service includes personalized support from designated clinical staff supervised by his physician, including individualized plan of care and coordination with other care providers 24/7 contact phone numbers for assistance for urgent and routine care needs. Service will only be billed when office clinical staff spend 20 minutes or more in a month to coordinate care. Only one practitioner may furnish and bill the service in a calendar month. The patient may stop CCM services at any time (effective at the end of the month) by phone call to the office staff.   Patient agreed to services and verbal consent obtained.   Follow up plan:   Tatjana Secretary/administrator

## 2021-06-30 IMAGING — US US SCROTUM W/ DOPPLER COMPLETE
1 series · 14 of 25 positions shown · non-contrast
Comparison: None

CLINICAL DATA: LEFT hydrocele, enlarged LEFT hemiscrotum for 3
weeks

EXAM:
SCROTAL ULTRASOUND
DOPPLER ULTRASOUND OF THE TESTICLES
TECHNIQUE: Complete ultrasound examination of the testicles, epididymis, and
other scrotal structures was performed. Color and spectral Doppler
ultrasound were also utilized to evaluate blood flow to the
testicles.

[Series 1: us scrotum w/doppler · 61 acquisitions, 14 frames shown]
[im 1/61]
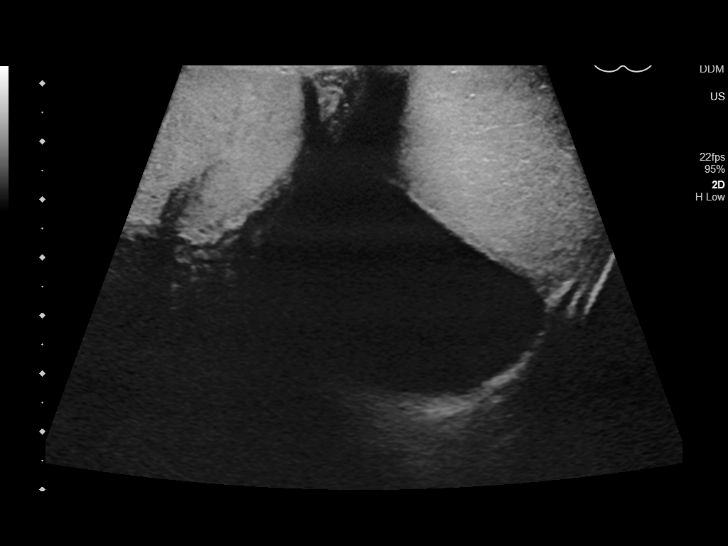
[im 6/61]
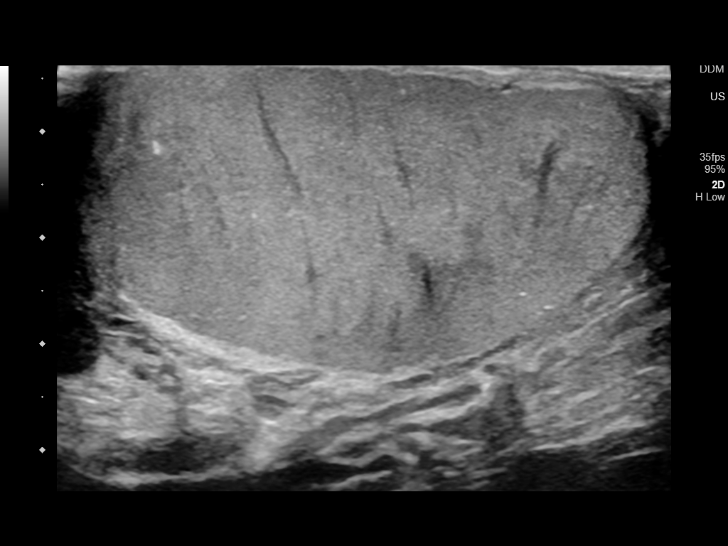
[im 11/61]
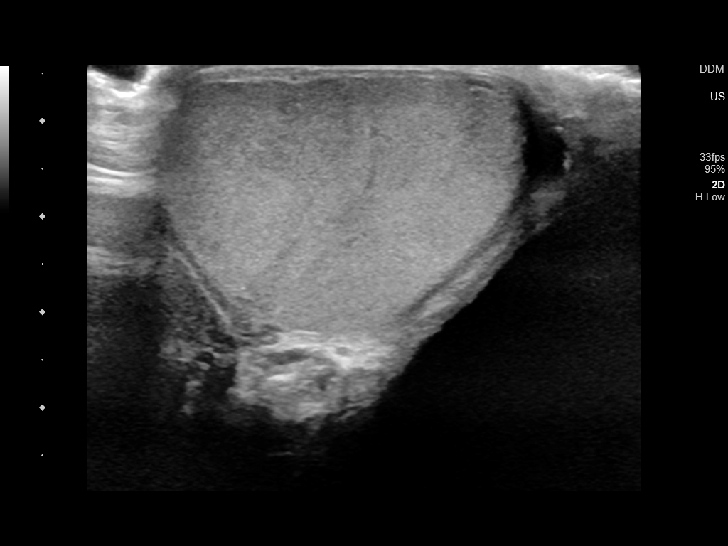
[im 16/61]
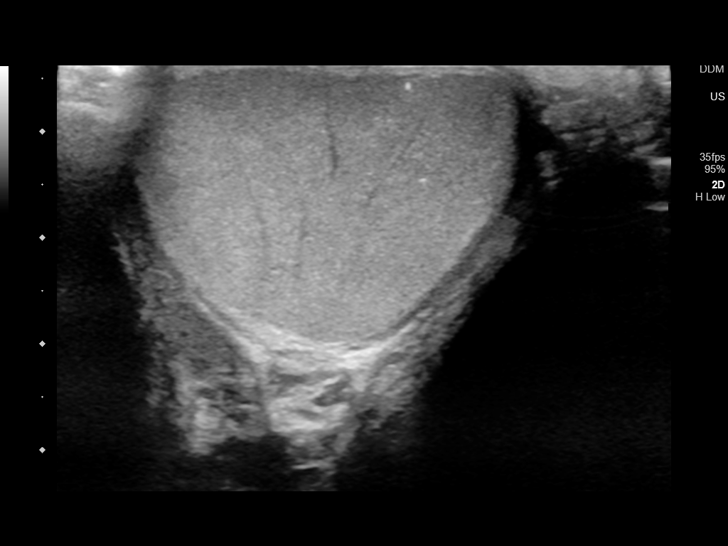
[im 21/61]
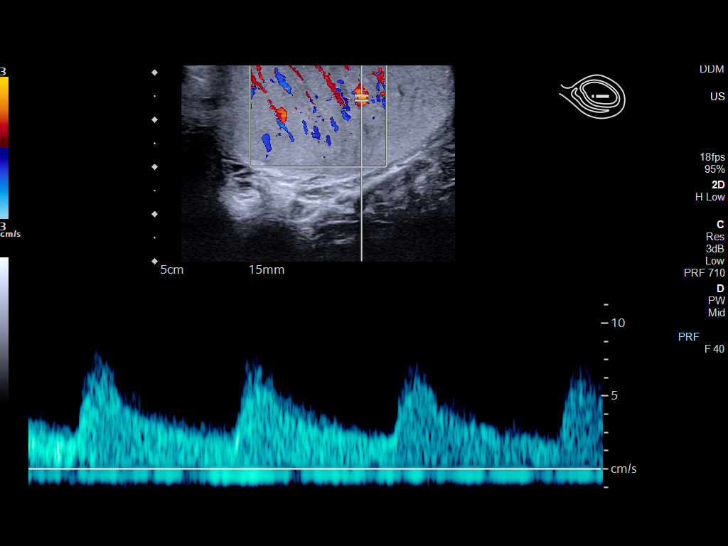
[im 23/61]
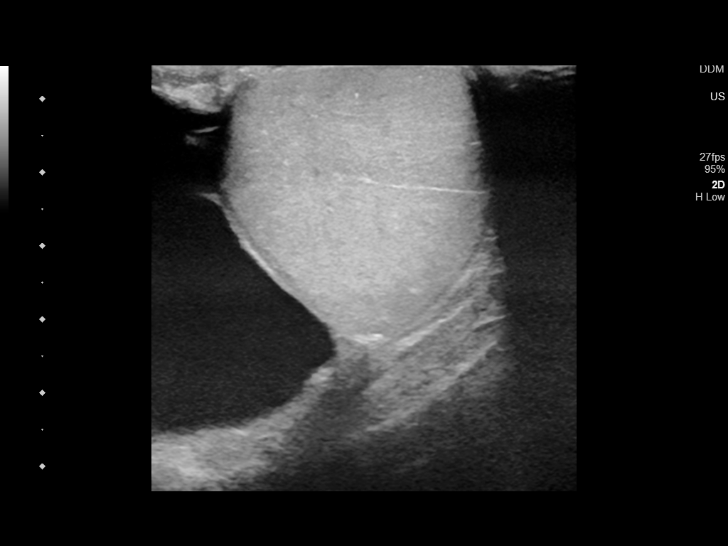
[im 28/61]
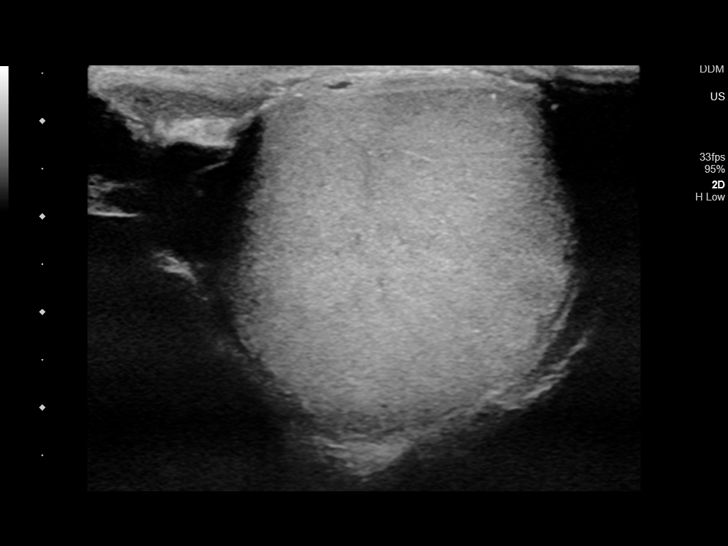
[im 33/61]
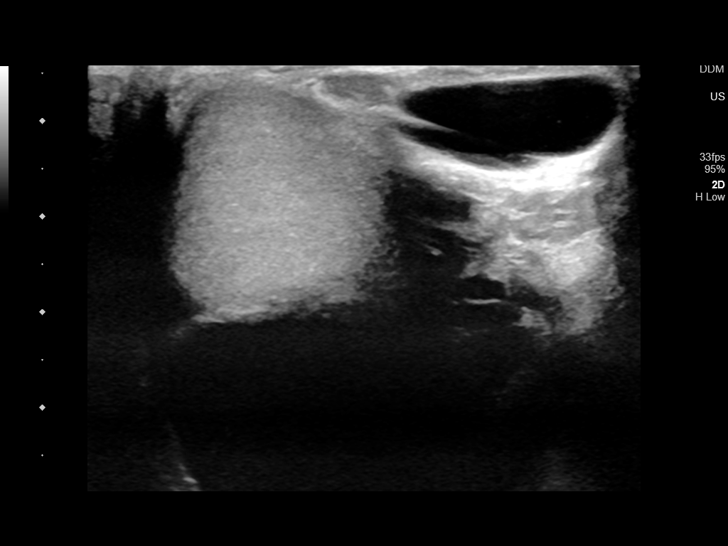
[im 38/61]
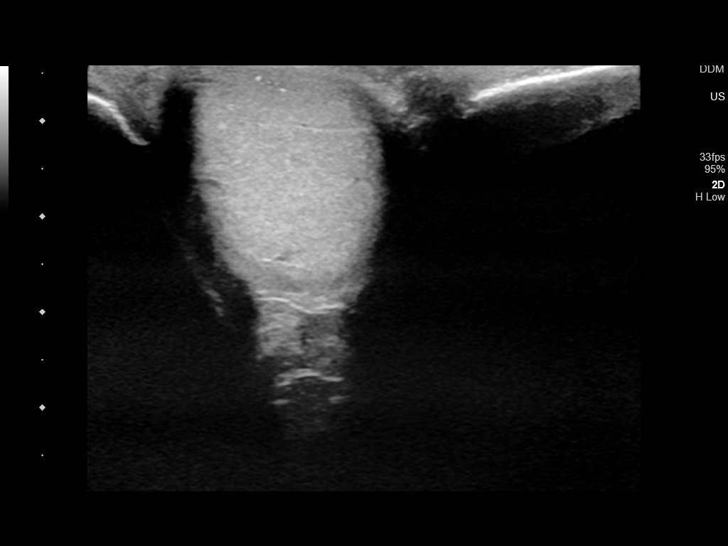
[im 41/61]
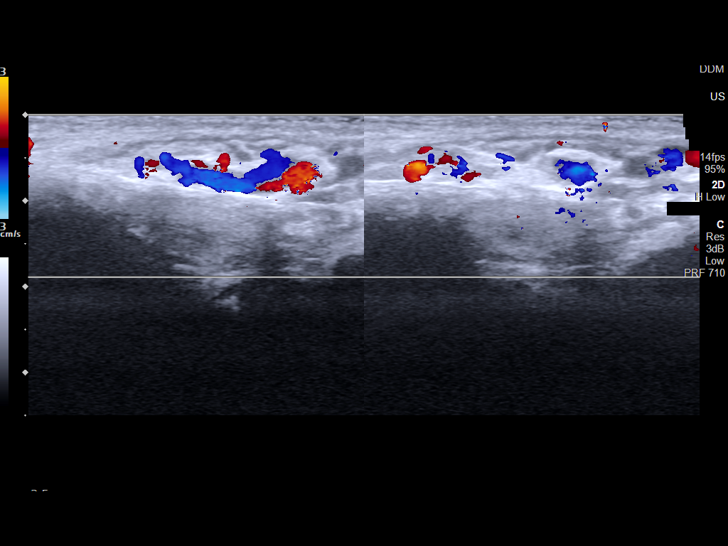
[im 46/61]
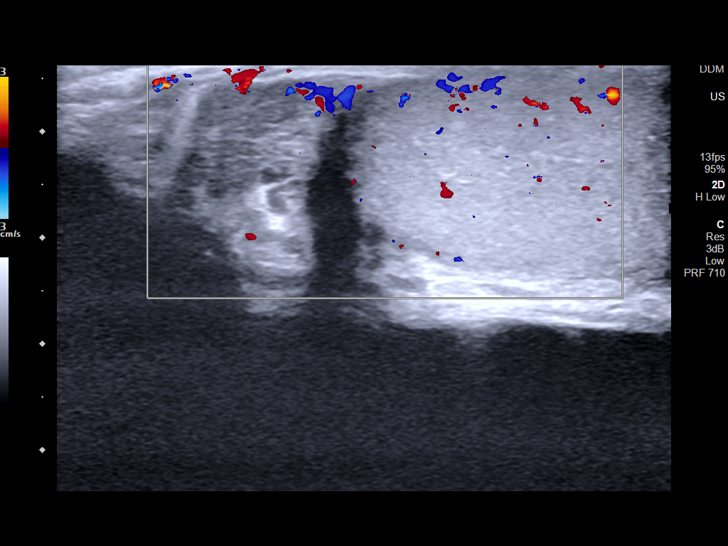
[im 51/61]
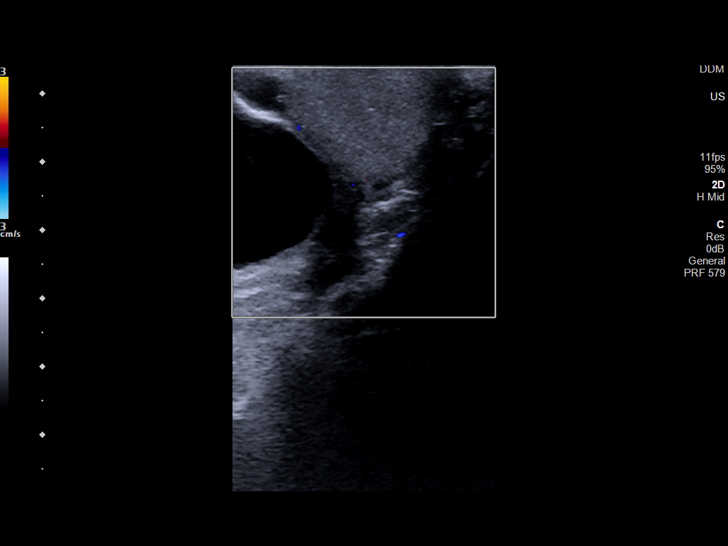
[im 56/61]
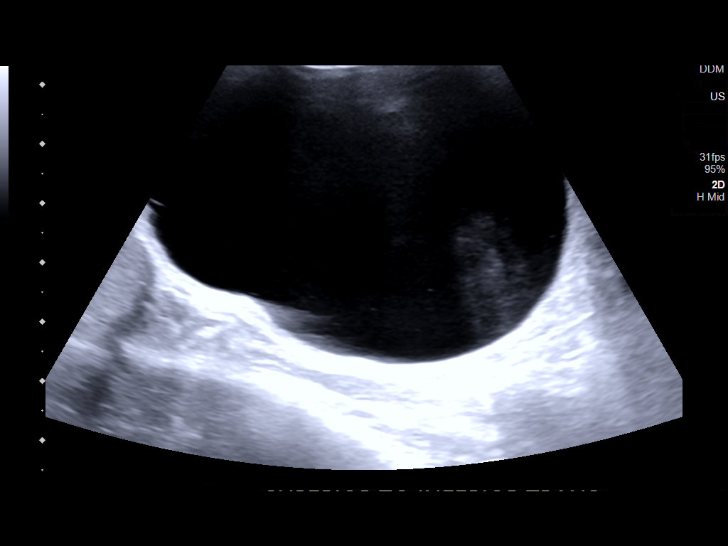
[im 61/61]
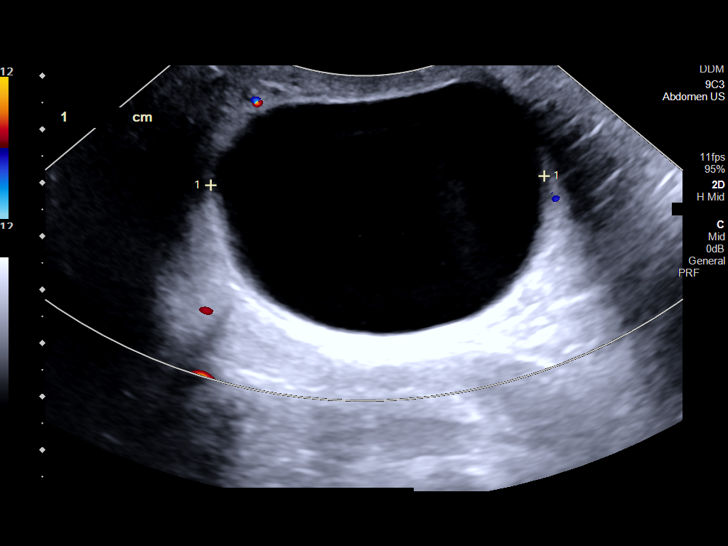

[14 of 25 positions shown; findings below may reference images not displayed]

FINDINGS: Right testicle

Measurements: 5.5 x 2.8 x 3.9 cm. Minimally striated appearance of
RIGHT testis. Few microcalcifications, nonspecific. No mass.
Internal blood flow present on color Doppler imaging.

Left testicle

Measurements: 3.6 x 3.7 x 4.0 cm. Normal echogenicity without mass.
Few tiny scattered nonspecific microcalcifications. Internal blood
flow present on color Doppler imaging.

Right epididymis:  Normal in size and appearance.

Left epididymis:  Normal in size and appearance.

Hydrocele: Trace RIGHT hydrocele. Large LEFT hydrocele measuring
x 4.3 x 6.2 cm

Varicocele:  None visualized.

Pulsed Doppler interrogation of both testes demonstrates normal low
resistance arterial and venous waveforms bilaterally.
IMPRESSION: Large LEFT hydrocele.

Otherwise negative exam.

## 2021-07-02 ENCOUNTER — Telehealth: Payer: Self-pay

## 2021-07-02 NOTE — Telephone Encounter (Signed)
Pt call back that he feels ok he was just answering her question he was not hurting himself and also he promise and we make him appt tomorrow and also [facility] np did referral for behavioral health

## 2021-07-02 NOTE — Telephone Encounter (Signed)
House call Np  mary called from [employer] [phone] called [place] that he was having thought that he is better dead of several days so I spoke with and  Alyssa spoke to [facility], he has been having thoughts that he is better off dead. He has been through a lot lately with his cat dying and he had a heart attack recently. These are stressors that increase his risk of having depression and possible suicidal thoughts. He was instructed to go to ER to be evaluated and then strongly encouraged to come into the office for follow up with Alyssa next week.

## 2021-07-03 ENCOUNTER — Other Ambulatory Visit: Payer: Self-pay

## 2021-07-03 ENCOUNTER — Ambulatory Visit (INDEPENDENT_AMBULATORY_CARE_PROVIDER_SITE_OTHER): Payer: Medicare Other | Admitting: Nurse Practitioner

## 2021-07-03 ENCOUNTER — Encounter: Payer: Self-pay | Admitting: Nurse Practitioner

## 2021-07-03 VITALS — BP 108/72 | HR 60 | Temp 98.1°F | Resp 16 | Ht 73.0 in | Wt 214.4 lb

## 2021-07-03 DIAGNOSIS — F320 Major depressive disorder, single episode, mild: Secondary | ICD-10-CM | POA: Diagnosis not present

## 2021-07-03 DIAGNOSIS — F17219 Nicotine dependence, cigarettes, with unspecified nicotine-induced disorders: Secondary | ICD-10-CM

## 2021-07-03 DIAGNOSIS — N401 Enlarged prostate with lower urinary tract symptoms: Secondary | ICD-10-CM

## 2021-07-03 DIAGNOSIS — R350 Frequency of micturition: Secondary | ICD-10-CM | POA: Diagnosis not present

## 2021-07-03 NOTE — Progress Notes (Signed)
[name] [name] [facility] [place], [place] [address]  Internal MEDICINE  Office Visit Note  Patient Name: [name] [name]  [id number]  [id number]  Date of Service: 07/03/2021  Chief Complaint  Patient presents with   Follow-up   Depression    Pt's pet died, needs left knee replacement, sometimes thinks about self harm    HPI [name] [name] presents for a follow up visit for depression. The house call NP from [employer] had called the patient yesterday to check in with him and was concerned about some of the answers he gave her. She was worried that his depression is worsening and that he could have thoughts of harming himself. After speaking to [facility] over the phone yesterday he agreed to come in today to talk about his depression aftert declining the recommendation yesterday afternoon to go to the ER to be evaluated and verbally contracting for safety over the phone.  Today, [name] [name] is a well-appearing 65 yo man presenting with a sad affect to his office visit today. He admits that he does have occasional thoughts that he is "better off dead" or that he wishes he was no longer here. He is dealing with stressors that increase his risk of worsening his depression and of having suicidal thoughts.  [name] [name] has been through a lot recently. His pet cat whom he had grown very close to emotionally and had for about 4 years died. After his cat died, [name] [name] had a heart attack and was hospitalized for a few days. He is also taking Chantix and working on smoking cessation. He states that he is just taking it one day at a time. He does have very vivid abnormal dreams which is a side effect of the Chantix but he reports that the dreams do not bother him.  [name] [name] states that even though he is having occasional thoughts such as those mentioned above, he is not suicidal. He reports that he does not want to kill himself. He reports feelings hopelessness and worthlessness sometimes. He denies suicidal ideations or  plan. He verbalizes that he has someone he trusts whom he can call if he does not feel safe. He verbalizes that he will call that person, call the clinic or 911 if he does not feel safe from himself prior to acting on those feelings.  At this time, he is still grieving the death of his cat. He is also in a 12 step program which has been very helpful even in dealing with his depression.     Current Medication: Outpatient Encounter Medications as of 07/03/2021  Medication Sig   albuterol (VENTOLIN HFA) 108 (90 Base) MCG/ACT inhaler Inhale 2 puffs into the lungs every 6 (six) hours as needed for wheezing or shortness of breath.   allopurinol (ZYLOPRIM) 100 MG tablet Take 2 tab po daily   aspirin EC 81 MG EC tablet Take 1 tablet (81 mg total) by mouth daily. Swallow whole.   atorvastatin (LIPITOR) 80 MG tablet Take 1 tablet (80 mg total) by mouth daily.   buPROPion (WELLBUTRIN SR) 150 MG 12 hr tablet Take 150 mg by mouth 2 (two) times daily.   clopidogrel (PLAVIX) 75 MG tablet Take 1 tablet (75 mg total) by mouth daily with breakfast.   Fluticasone-Umeclidin-Vilant (TRELEGY ELLIPTA) 100-62.5-25 MCG/INH AEPB Inhale 1 puff into the lungs daily.   indomethacin (INDOCIN) 50 MG capsule Take 50 mg by mouth at bedtime.   meloxicam (MOBIC) 7.5 MG tablet TAKE 1 TABLET(7.5 MG) BY MOUTH DAILY  sertraline (ZOLOFT) 25 MG tablet Take 1 tablet (25 mg total) by mouth daily.   sildenafil (VIAGRA) 100 MG tablet Take 1 tablet (100 mg total) by mouth daily as needed for erectile dysfunction.   tamsulosin (FLOMAX) 0.4 MG CAPS capsule Take 0.4 mg by mouth daily.   traMADol (ULTRAM) 50 MG tablet Take 1 tablet (50 mg total) by mouth every 12 (twelve) hours as needed for moderate pain or severe pain.   [DISCONTINUED] metoprolol tartrate (LOPRESSOR) 25 MG tablet Take 0.5 tablets (12.5 mg total) by mouth 2 (two) times daily. (Patient not taking: Reported on 07/17/2021)   [DISCONTINUED] tolterodine (DETROL LA) 4 MG 24 hr  capsule Take 4 mg by mouth daily.   [DISCONTINUED] varenicline (CHANTIX) 1 MG tablet Take 1 mg by mouth 2 (two) times daily.   tolterodine (DETROL LA) 4 MG 24 hr capsule Take 1 capsule (4 mg total) by mouth daily.   varenicline (CHANTIX) 1 MG tablet Take 1 tablet (1 mg total) by mouth 2 (two) times daily.   No facility-administered encounter medications on file as of 07/03/2021.    Surgical History: Past Surgical History:  Procedure Laterality Date   CARDIAC CATHETERIZATION     COLONOSCOPY WITH PROPOFOL N/A 04/07/2021   Procedure: COLONOSCOPY WITH PROPOFOL;  Surgeon: Virgel Manifold, MD;  Location: [facility];  Service: Endoscopy;  Laterality: N/A;   LEFT HEART CATH AND CORONARY ANGIOGRAPHY N/A 05/18/2021   Procedure: LEFT HEART CATH AND CORONARY ANGIOGRAPHY;  Surgeon: Wellington Hampshire, MD;  Location: [facility];  Service: Cardiovascular;  Laterality: N/A;    Medical History: Past Medical History:  Diagnosis Date   Arthritis    COPD (chronic obstructive pulmonary disease) ([place])    Depression    Emphysema of lung ([place])    Gout    left knee   Heart attack ([place])     Family History: Family History  Problem Relation Age of Onset   Alcohol abuse Mother    Alcohol abuse Father    Alcohol abuse Brother     Social History   Socioeconomic History   Marital status: Married    Spouse name: Not on file   Number of children: Not on file   Years of education: Not on file   Highest education level: Not on file  Occupational History   Not on file  Tobacco Use   Smoking status: Every Day    Packs/day: 2.00    Years: 52.00    Pack years: 104.00    Types: Cigarettes   Smokeless tobacco: Never   Tobacco comments:    10 a day currently  Vaping Use   Vaping Use: Never used  Substance and Sexual Activity   Alcohol use: Not Currently   Drug use: Never   Sexual activity: Not on file  Other Topics Concern   Not on file  Social History Narrative   Not on file    Social Determinants of Health   Financial Resource Strain: Not on file  Food Insecurity: Not on file  Transportation Needs: Not on file  Physical Activity: Not on file  Stress: Not on file  Social Connections: Not on file  Intimate Partner Violence: Not on file      Review of Systems  Constitutional:  Negative for chills, fatigue and unexpected weight change.  HENT:  Negative for congestion, rhinorrhea, sneezing and sore throat.   Eyes:  Negative for redness.  Respiratory:  Negative for cough, chest tightness and shortness of  breath.   Cardiovascular:  Negative for chest pain and palpitations.  Gastrointestinal:  Negative for abdominal pain, constipation, diarrhea, nausea and vomiting.  Genitourinary:  Negative for dysuria and frequency.  Musculoskeletal:  Negative for arthralgias, back pain, joint swelling and neck pain.  Skin:  Negative for rash.  Neurological: Negative.  Negative for tremors and numbness.  Hematological:  Negative for adenopathy. Does not bruise/bleed easily.  Psychiatric/Behavioral:  Positive for behavioral problems (Depression) and dysphoric mood. Negative for self-injury, sleep disturbance and suicidal ideas. The patient is not nervous/anxious.    Vital Signs: BP 108/72   Pulse (!) 55   Temp 98.1 F (36.7 C)   Resp 16   Ht 6\' 1"  (1.854 m)   Wt 214 lb 6.4 oz (97.3 kg)   SpO2 99%   BMI 28.29 kg/m    Physical Exam Vitals reviewed.  Constitutional:      General: He is not in acute distress.    Appearance: Normal appearance. He is normal weight. He is not ill-appearing.  Eyes:     Extraocular Movements: Extraocular movements intact.     Pupils: Pupils are equal, round, and reactive to light.  Cardiovascular:     Rate and Rhythm: Normal rate and regular rhythm.  Pulmonary:     Effort: Pulmonary effort is normal. No respiratory distress.  Neurological:     Mental Status: He is alert and oriented to person, place, and time.     Cranial Nerves:  No cranial nerve deficit.     Coordination: Coordination normal.     Gait: Gait normal.  Psychiatric:        Mood and Affect: Affect normal. Mood is depressed.        Speech: Speech normal.        Behavior: Behavior normal. Behavior is cooperative.       Assessment/Plan: 1. Benign prostatic hyperplasia with urinary frequency Stable, refill ordered - tolterodine (DETROL LA) 4 MG 24 hr capsule; Take 1 capsule (4 mg total) by mouth daily.  Dispense: 90 capsule; Refill: 1  2. Cigarette nicotine dependence with nicotine-induced disorder Working on smoking cessation, refill ordered - varenicline (CHANTIX) 1 MG tablet; Take 1 tablet (1 mg total) by mouth 2 (two) times daily.  Dispense: 60 tablet; Refill: 0  3. Depression, major, single episode, mild ([place]) Discussed depression and current stressors at length. He is processing the death of his pet cat. He acknowledges that he is not suicidal and contracts for safety that if he were to have those thoughts he will call a trusted friend or family member, call the clinic, call 911 or go to the ER.     General Counseling: Tivon verbalizes understanding of the findings of todays visit and agrees with plan of treatment. I have discussed any further diagnostic evaluation that may be needed or ordered today. We also reviewed his medications today. he has been encouraged to call the office with any questions or concerns that should arise related to todays visit.    No orders of the defined types were placed in this encounter.   Meds ordered this encounter  Medications   tolterodine (DETROL LA) 4 MG 24 hr capsule    Sig: Take 1 capsule (4 mg total) by mouth daily.    Dispense:  90 capsule    Refill:  1   varenicline (CHANTIX) 1 MG tablet    Sig: Take 1 tablet (1 mg total) by mouth 2 (two) times daily.    Dispense:  60  tablet    Refill:  0    Return in about 1 month (around 08/03/2021) for F/U, eval new med, [place] PCP.   Total time  spent:30 Minutes Time spent includes review of chart, medications, test results, and follow up plan with the patient.   [place] Controlled Substance Database was reviewed by me.  This patient was seen by Jonetta Osgood, FNP-C in collaboration with Dr. Clayborn Bigness as a part of collaborative care agreement.   Nekeya Briski R. Valetta Fuller, MSN, FNP-C Internal medicine

## 2021-07-06 ENCOUNTER — Other Ambulatory Visit: Payer: Self-pay

## 2021-07-06 ENCOUNTER — Encounter: Payer: Self-pay | Admitting: Internal Medicine

## 2021-07-06 ENCOUNTER — Ambulatory Visit: Payer: Medicare Other | Admitting: Internal Medicine

## 2021-07-06 VITALS — BP 100/60 | HR 58 | Temp 98.0°F | Resp 16 | Ht 73.0 in | Wt 214.0 lb

## 2021-07-06 DIAGNOSIS — F17219 Nicotine dependence, cigarettes, with unspecified nicotine-induced disorders: Secondary | ICD-10-CM

## 2021-07-06 DIAGNOSIS — J449 Chronic obstructive pulmonary disease, unspecified: Secondary | ICD-10-CM | POA: Diagnosis not present

## 2021-07-06 DIAGNOSIS — R0602 Shortness of breath: Secondary | ICD-10-CM

## 2021-07-06 NOTE — Patient Instructions (Signed)

## 2021-07-06 NOTE — Progress Notes (Signed)
[facility] [place], [place] [address]  Pulmonary Sleep Medicine   Office Visit Note  Patient Name: [name] [name] DOB: [birth year] MRN [id number]  Date of Service: 07/06/2021  Complaints/HPI: COPD Depression CT results Smoking about a half pack now unfortunately has not been able to completely eliminate the cigarettes.  Patient Enjoys smoking.  Patient's COPD is moderate his last FEV1 was 2.25 L which was 62% of predicted.  The patient does have cough and has had some sputum production.  Without any hemoptysis noted.  ROS  General: (-) fever, (-) chills, (-) night sweats, (-) weakness Skin: (-) rashes, (-) itching,. Eyes: (-) visual changes, (-) redness, (-) itching. Nose and Sinuses: (-) nasal stuffiness or itchiness, (-) postnasal drip, (-) nosebleeds, (-) sinus trouble. Mouth and Throat: (-) sore throat, (-) hoarseness. Neck: (-) swollen glands, (-) enlarged thyroid, (-) neck pain. Respiratory: + cough, (-) bloody sputum, + shortness of breath, - wheezing. Cardiovascular: - ankle swelling, (-) chest pain. Lymphatic: (-) lymph node enlargement. Neurologic: (-) numbness, (-) tingling. Psychiatric: (-) anxiety, (-) depression   Current Medication: Outpatient Encounter Medications as of 07/06/2021  Medication Sig   albuterol (VENTOLIN HFA) 108 (90 Base) MCG/ACT inhaler Inhale 2 puffs into the lungs every 6 (six) hours as needed for wheezing or shortness of breath.   allopurinol (ZYLOPRIM) 100 MG tablet Take 2 tab po daily   aspirin EC 81 MG EC tablet Take 1 tablet (81 mg total) by mouth daily. Swallow whole.   atorvastatin (LIPITOR) 80 MG tablet Take 1 tablet (80 mg total) by mouth daily.   buPROPion (WELLBUTRIN SR) 150 MG 12 hr tablet Take 150 mg by mouth 2 (two) times daily.   clopidogrel (PLAVIX) 75 MG tablet Take 1 tablet (75 mg total) by mouth daily with breakfast.   Fluticasone-Umeclidin-Vilant (TRELEGY ELLIPTA) 100-62.5-25 MCG/INH AEPB Inhale  1 puff into the lungs daily.   indomethacin (INDOCIN) 50 MG capsule Take 50 mg by mouth at bedtime.   meloxicam (MOBIC) 7.5 MG tablet TAKE 1 TABLET(7.5 MG) BY MOUTH DAILY   metoprolol tartrate (LOPRESSOR) 25 MG tablet Take 0.5 tablets (12.5 mg total) by mouth 2 (two) times daily.   sertraline (ZOLOFT) 25 MG tablet Take 1 tablet (25 mg total) by mouth daily.   sildenafil (VIAGRA) 100 MG tablet Take 1 tablet (100 mg total) by mouth daily as needed for erectile dysfunction.   tamsulosin (FLOMAX) 0.4 MG CAPS capsule Take 0.4 mg by mouth daily.   tolterodine (DETROL LA) 4 MG 24 hr capsule Take 4 mg by mouth daily.   traMADol (ULTRAM) 50 MG tablet Take 1 tablet (50 mg total) by mouth every 12 (twelve) hours as needed for moderate pain or severe pain.   varenicline (CHANTIX) 1 MG tablet Take 1 mg by mouth 2 (two) times daily.   No facility-administered encounter medications on file as of 07/06/2021.    Surgical History: Past Surgical History:  Procedure Laterality Date   CARDIAC CATHETERIZATION     COLONOSCOPY WITH PROPOFOL N/A 04/07/2021   Procedure: COLONOSCOPY WITH PROPOFOL;  Surgeon: Virgel Manifold, MD;  Location: [facility] ENDOSCOPY;  Service: Endoscopy;  Laterality: N/A;   LEFT HEART CATH AND CORONARY ANGIOGRAPHY N/A 05/18/2021   Procedure: LEFT HEART CATH AND CORONARY ANGIOGRAPHY;  Surgeon: Wellington Hampshire, MD;  Location: [facility];  Service: Cardiovascular;  Laterality: N/A;    Medical History: Past Medical History:  Diagnosis Date   Arthritis    COPD (chronic obstructive  pulmonary disease) ([place])    Depression    Emphysema of lung ([place])    Gout    left knee   Heart attack ([place])     Family History: Family History  Problem Relation Age of Onset   Alcohol abuse Mother    Alcohol abuse Father    Alcohol abuse Brother     Social History: Social History   Socioeconomic History   Marital status: Married    Spouse name: Not on file   Number of children: Not on  file   Years of education: Not on file   Highest education level: Not on file  Occupational History   Not on file  Tobacco Use   Smoking status: Every Day    Packs/day: 2.00    Years: 52.00    Pack years: 104.00    Types: Cigarettes   Smokeless tobacco: Never   Tobacco comments:    10 a day currently  Vaping Use   Vaping Use: Never used  Substance and Sexual Activity   Alcohol use: Not Currently   Drug use: Never   Sexual activity: Not on file  Other Topics Concern   Not on file  Social History Narrative   Not on file   Social Determinants of Health   Financial Resource Strain: Not on file  Food Insecurity: Not on file  Transportation Needs: Not on file  Physical Activity: Not on file  Stress: Not on file  Social Connections: Not on file  Intimate Partner Violence: Not on file    Vital Signs: Blood pressure 100/60, pulse (!) 58, temperature 98 F (36.7 C), resp. rate 16, height '6\' 1"'  (1.854 m), weight 214 lb (97.1 kg), SpO2 98 %.  Examination: General Appearance: The patient is well-developed, well-nourished, and in no distress. Skin: Gross inspection of skin unremarkable. Head: normocephalic, no gross deformities. Eyes: no gross deformities noted. ENT: ears appear grossly normal no exudates. Neck: Supple. No thyromegaly. No LAD. Respiratory: no rhonchinoted. Cardiovascular: Normal S1 and S2 without murmur or rub. Extremities: No cyanosis. pulses are equal. Neurologic: Alert and oriented. No involuntary movements.  LABS: Recent Results (from the past 2160 hour(s))  Basic metabolic panel     Status: Abnormal   Collection Time: 05/17/21  8:40 AM  Result Value Ref Range   Sodium 139 135 - 145 mmol/L   Potassium 4.3 3.5 - 5.1 mmol/L   Chloride 106 98 - 111 mmol/L   CO2 24 22 - 32 mmol/L   Glucose, Bld 112 (H) 70 - 99 mg/dL    Comment: Glucose reference range applies only to samples taken after fasting for at least 8 hours.   BUN 15 8 - 23 mg/dL    Creatinine, Ser 1.24 0.61 - 1.24 mg/dL   Calcium 9.3 8.9 - 10.3 mg/dL   GFR, Estimated >60 >60 mL/min    Comment: (NOTE) Calculated using the CKD-EPI Creatinine Equation (2021)    Anion gap 9 5 - 15    Comment: Performed at [facility], [address] [place], [place] [address]  CBC     Status: None   Collection Time: 05/17/21  8:40 AM  Result Value Ref Range   WBC 7.0 4.0 - 10.5 K/uL   RBC 4.76 4.22 - 5.81 MIL/uL   Hemoglobin 14.6 13.0 - 17.0 g/dL   HCT 42.4 39.0 - 52.0 %   MCV 89.1 80.0 - 100.0 fL   MCH 30.7 26.0 - 34.0 pg   MCHC 34.4 30.0 -  36.0 g/dL   RDW 13.2 11.5 - 15.5 %   Platelets 173 150 - 400 K/uL   nRBC 0.0 0.0 - 0.2 %    Comment: Performed at [facility], [address] [place], [place] [address]  Troponin I (High Sensitivity)     Status: Abnormal   Collection Time: 05/17/21  8:40 AM  Result Value Ref Range   Troponin I (High Sensitivity) 1,086 (HH) <18 ng/L    Comment: CRITICAL RESULT CALLED TO, READ BACK BY AND VERIFIED WITH OLIVIA [facility] 05/17/21 0934 [facility] (NOTE) Elevated high sensitivity troponin I (hsTnI) values and significant  changes across serial measurements may suggest ACS but many other  chronic and acute conditions are known to elevate hsTnI results.  Refer to the "Links" section for chest pain algorithms and additional  guidance. Performed at [facility], [address] [place], [place] [address]   Brain natriuretic peptide     Status: Abnormal   Collection Time: 05/17/21  8:40 AM  Result Value Ref Range   B Natriuretic Peptide 112.4 (H) 0.0 - 100.0 pg/mL    Comment: Performed at [facility], [address] [place], [place] [address]  Resp Panel by RT-PCR (Flu A&B, Covid) Nasopharyngeal Swab     Status: None   Collection Time: 05/17/21  9:57 AM   Specimen: Nasopharyngeal Swab; Nasopharyngeal(NP) swabs in vial transport medium  Result Value Ref Range   SARS Coronavirus 2 by RT PCR NEGATIVE  NEGATIVE    Comment: (NOTE) SARS-CoV-2 target nucleic acids are NOT DETECTED.  The SARS-CoV-2 RNA is generally detectable in upper respiratory specimens during the acute phase of infection. The lowest concentration of SARS-CoV-2 viral copies this assay can detect is 138 copies/mL. A negative result does not preclude SARS-Cov-2 infection and should not be used as the sole basis for treatment or other patient management decisions. A negative result may occur with  improper specimen collection/handling, submission of specimen other than nasopharyngeal swab, presence of viral mutation(s) within the areas targeted by this assay, and inadequate number of viral copies(<138 copies/mL). A negative result must be combined with clinical observations, patient history, and epidemiological information. The expected result is Negative.  Fact Sheet for Patients:  EntrepreneurPulse.com.au  Fact Sheet for Healthcare Providers:  IncredibleEmployment.be  This test is no t yet approved or cleared by the [place] FDA and  has been authorized for detection and/or diagnosis of SARS-CoV-2 by FDA under an Emergency Use Authorization (EUA). This EUA will remain  in effect (meaning this test can be used) for the duration of the COVID-19 declaration under Section 564(b)(1) of the Act, 21 U.S.C.section 360bbb-3(b)(1), unless the authorization is terminated  or revoked sooner.       Influenza A by PCR NEGATIVE NEGATIVE   Influenza B by PCR NEGATIVE NEGATIVE    Comment: (NOTE) The Xpert Xpress SARS-CoV-2/FLU/RSV plus assay is intended as an aid in the diagnosis of influenza from Nasopharyngeal swab specimens and should not be used as a sole basis for treatment. Nasal washings and aspirates are unacceptable for Xpert Xpress SARS-CoV-2/FLU/RSV testing.  Fact Sheet for Patients: EntrepreneurPulse.com.au  Fact Sheet for Healthcare  Providers: IncredibleEmployment.be  This test is not yet approved or cleared by the [place] FDA and has been authorized for detection and/or diagnosis of SARS-CoV-2 by FDA under an Emergency Use Authorization (EUA). This EUA will remain in effect (meaning this test can be used) for the duration of the COVID-19 declaration under Section 564(b)(1) of the  Act, 21 U.S.C. section 360bbb-3(b)(1), unless the authorization is terminated or revoked.  Performed at [facility], [address] [place], [place] [address]   Troponin I (High Sensitivity)     Status: Abnormal   Collection Time: 05/17/21 11:02 AM  Result Value Ref Range   Troponin I (High Sensitivity) 1,322 (HH) <18 ng/L    Comment: CRITICAL VALUE NOTED. VALUE IS CONSISTENT WITH PREVIOUSLY REPORTED/CALLED VALUE [facility] (NOTE) Elevated high sensitivity troponin I (hsTnI) values and significant  changes across serial measurements may suggest ACS but many other  chronic and acute conditions are known to elevate hsTnI results.  Refer to the "Links" section for chest pain algorithms and additional  guidance. Performed at [facility], [address] [place], [place] [address]   Urine Drug Screen, Qualitative [facility] only)     Status: None   Collection Time: 05/17/21  2:16 PM  Result Value Ref Range   Tricyclic, Ur Screen NONE DETECTED NONE DETECTED   Amphetamines, Ur Screen NONE DETECTED NONE DETECTED   MDMA (Ecstasy)Ur Screen NONE DETECTED NONE DETECTED   Cocaine Metabolite,Ur [place] NONE DETECTED NONE DETECTED   Opiate, Ur Screen NONE DETECTED NONE DETECTED   Phencyclidine (PCP) Ur S NONE DETECTED NONE DETECTED   Cannabinoid 50 Ng, Ur [place] NONE DETECTED NONE DETECTED   Barbiturates, Ur Screen NONE DETECTED NONE DETECTED   Benzodiazepine, Ur Scrn NONE DETECTED NONE DETECTED   Methadone Scn, Ur NONE DETECTED NONE DETECTED    Comment: (NOTE) Tricyclics + metabolites, urine    Cutoff 1000  ng/mL Amphetamines + metabolites, urine  Cutoff 1000 ng/mL MDMA (Ecstasy), urine              Cutoff 500 ng/mL Cocaine Metabolite, urine          Cutoff 300 ng/mL Opiate + metabolites, urine        Cutoff 300 ng/mL Phencyclidine (PCP), urine         Cutoff 25 ng/mL Cannabinoid, urine                 Cutoff 50 ng/mL Barbiturates + metabolites, urine  Cutoff 200 ng/mL Benzodiazepine, urine              Cutoff 200 ng/mL Methadone, urine                   Cutoff 300 ng/mL  The urine drug screen provides only a preliminary, unconfirmed analytical test result and should not be used for non-medical purposes. Clinical consideration and professional judgment should be applied to any positive drug screen result due to possible interfering substances. A more specific alternate chemical method must be used in order to obtain a confirmed analytical result. Gas chromatography / mass spectrometry (GC/MS) is the preferred confirm atory method. Performed at [facility], [address] [place],  [address]   ECHOCARDIOGRAM COMPLETE     Status: None   Collection Time: 05/17/21  2:49 PM  Result Value Ref Range   Weight 3,440 oz   Height 73 in   BP 134/83 mmHg   Ao pk vel 1.26 m/s   AV Area VTI 3.09 cm2   AR max vel 2.68 cm2   AV Mean grad 3.0 mmHg   AV Peak grad 6.4 mmHg   S' Lateral 4.17 cm   AV Area mean vel 2.89 cm2   Area-P 1/2 2.50 cm2  HIV Antibody (routine testing w rflx)     Status: None   Collection Time: 05/17/21  3:37 PM  Result Value Ref Range   HIV Screen 4th Generation wRfx Non Reactive Non Reactive    Comment: Performed at [facility], [place] [address] [place], [place] [address]  TSH     Status: None   Collection Time: 05/17/21  3:37 PM  Result Value Ref Range   TSH 1.198 0.350 - 4.500 uIU/mL    Comment: Performed by a 3rd Generation assay with a functional sensitivity of <=0.01 uIU/mL. Performed at [facility], [address]  [place], [place] [address]   Vitamin B12     Status: Abnormal   Collection Time: 05/17/21  3:37 PM  Result Value Ref Range   Vitamin B-12 3,335 (H) 180 - 914 pg/mL    Comment: (NOTE) This assay is not validated for testing neonatal or myeloproliferative syndrome specimens for Vitamin B12 levels. Performed at [facility], [place] [address] [place], [place] [address]   Protime-INR     Status: None   Collection Time: 05/17/21  3:37 PM  Result Value Ref Range   Prothrombin Time 14.0 11.4 - 15.2 seconds   INR 1.1 0.8 - 1.2    Comment: (NOTE) INR goal varies based on device and disease states. Performed at [facility], [address] [place], [place] [address]   APTT     Status: Abnormal   Collection Time: 05/17/21  3:37 PM  Result Value Ref Range   aPTT 120 (H) 24 - 36 seconds    Comment:        IF BASELINE aPTT IS ELEVATED, SUGGEST PATIENT RISK ASSESSMENT BE USED TO DETERMINE APPROPRIATE ANTICOAGULANT THERAPY. Performed at [facility], [place], [place] [address]   Heparin level (unfractionated)     Status: Abnormal   Collection Time: 05/17/21  8:54 PM  Result Value Ref Range   Heparin Unfractionated 0.23 (L) 0.30 - 0.70 IU/mL    Comment: (NOTE) The clinical reportable range upper limit is being lowered to >1.10 to align with the FDA approved guidance for the current laboratory assay.  If heparin results are below expected values, and patient dosage has  been confirmed, suggest follow up testing of antithrombin III levels. Performed at [facility], [address] [place], [place] [address]   Lipid panel     Status: Abnormal   Collection Time: 05/18/21  4:24 AM  Result Value Ref Range   Cholesterol 142 0 - 200 mg/dL   Triglycerides 85 <150 mg/dL   HDL 32 (L) >40 mg/dL   Total CHOL/HDL Ratio 4.4 RATIO   VLDL 17 0 - 40 mg/dL   LDL Cholesterol 93 0 - 99 mg/dL    Comment:        Total Cholesterol/HDL:CHD Risk Coronary  Heart Disease Risk Table                     Men   Women  1/2 Average Risk   3.4   3.3  Average Risk       5.0   4.4  2 X Average Risk   9.6   7.1  3 X Average Risk  23.4   11.0        Use the calculated Patient Ratio above and the CHD Risk Table to determine the patient's CHD Risk.        ATP III CLASSIFICATION (LDL):  <100     mg/dL   Optimal  100-129  mg/dL   Near  or Above                    Optimal  130-159  mg/dL   Borderline  160-189  mg/dL   High  >190     mg/dL   Very High Performed at [facility], [address] [place], [place] [address]   CBC     Status: None   Collection Time: 05/18/21  4:24 AM  Result Value Ref Range   WBC 6.8 4.0 - 10.5 K/uL   RBC 4.45 4.22 - 5.81 MIL/uL   Hemoglobin 13.5 13.0 - 17.0 g/dL   HCT 39.3 39.0 - 52.0 %   MCV 88.3 80.0 - 100.0 fL   MCH 30.3 26.0 - 34.0 pg   MCHC 34.4 30.0 - 36.0 g/dL   RDW 13.1 11.5 - 15.5 %   Platelets 156 150 - 400 K/uL   nRBC 0.0 0.0 - 0.2 %    Comment: Performed at [facility], [address] [place], [place] [address]  Basic metabolic panel     Status: Abnormal   Collection Time: 05/18/21  4:24 AM  Result Value Ref Range   Sodium 138 135 - 145 mmol/L   Potassium 4.1 3.5 - 5.1 mmol/L   Chloride 106 98 - 111 mmol/L   CO2 26 22 - 32 mmol/L   Glucose, Bld 112 (H) 70 - 99 mg/dL    Comment: Glucose reference range applies only to samples taken after fasting for at least 8 hours.   BUN 13 8 - 23 mg/dL   Creatinine, Ser 0.98 0.61 - 1.24 mg/dL   Calcium 8.8 (L) 8.9 - 10.3 mg/dL   GFR, Estimated >60 >60 mL/min    Comment: (NOTE) Calculated using the CKD-EPI Creatinine Equation (2021)    Anion gap 6 5 - 15    Comment: Performed at [facility], [place], [place] [address]  Heparin level (unfractionated)     Status: Abnormal   Collection Time: 05/18/21  4:24 AM  Result Value Ref Range   Heparin Unfractionated 0.28 (L) 0.30 - 0.70 IU/mL    Comment: (NOTE) The  clinical reportable range upper limit is being lowered to >1.10 to align with the FDA approved guidance for the current laboratory assay.  If heparin results are below expected values, and patient dosage has  been confirmed, suggest follow up testing of antithrombin III levels. Performed at [facility], [place], [place] [address]   Heparin level (unfractionated)     Status: None   Collection Time: 05/18/21 11:04 AM  Result Value Ref Range   Heparin Unfractionated 0.69 0.30 - 0.70 IU/mL    Comment: (NOTE) The clinical reportable range upper limit is being lowered to >1.10 to align with the FDA approved guidance for the current laboratory assay.  If heparin results are below expected values, and patient dosage has  been confirmed, suggest follow up testing of antithrombin III levels. Performed at [facility], [address] [place], [place] [address]   Hemoglobin A1c     Status: Abnormal   Collection Time: 05/18/21 11:04 AM  Result Value Ref Range   Hgb A1c MFr Bld 6.1 (H) 4.8 - 5.6 %    Comment: (NOTE) Pre diabetes:          5.7%-6.4%  Diabetes:              >6.4%  Glycemic control for   <7.0% adults with diabetes  Mean Plasma Glucose 128.37 mg/dL    Comment: Performed at [facility] [address], [place], [place] [address]  Troponin I (High Sensitivity)     Status: Abnormal   Collection Time: 05/18/21 11:04 AM  Result Value Ref Range   Troponin I (High Sensitivity) 674 (HH) <18 ng/L    Comment: CRITICAL VALUE NOTED. VALUE IS CONSISTENT WITH PREVIOUSLY REPORTED/CALLED VALUE DAS (NOTE) Elevated high sensitivity troponin I (hsTnI) values and significant  changes across serial measurements may suggest ACS but many other  chronic and acute conditions are known to elevate hsTnI results.  Refer to the "Links" section for chest pain algorithms and additional  guidance. Performed at [facility], [address]  [place], [place] [address]   CBC     Status: None   Collection Time: 05/19/21  3:44 AM  Result Value Ref Range   WBC 6.6 4.0 - 10.5 K/uL   RBC 4.49 4.22 - 5.81 MIL/uL   Hemoglobin 13.8 13.0 - 17.0 g/dL   HCT 39.8 39.0 - 52.0 %   MCV 88.6 80.0 - 100.0 fL   MCH 30.7 26.0 - 34.0 pg   MCHC 34.7 30.0 - 36.0 g/dL   RDW 13.2 11.5 - 15.5 %   Platelets 155 150 - 400 K/uL   nRBC 0.0 0.0 - 0.2 %    Comment: Performed at [facility], [address] [place], [place] [address]  Basic Metabolic Panel (BMET)     Status: None   Collection Time: 05/20/21  1:57 PM  Result Value Ref Range   Glucose 93 65 - 99 mg/dL   BUN 15 8 - 27 mg/dL   Creatinine, Ser 1.18 0.76 - 1.27 mg/dL   eGFR 68 >59 mL/min/1.73   BUN/Creatinine Ratio 13 10 - 24   Sodium 143 134 - 144 mmol/L   Potassium 4.9 3.5 - 5.2 mmol/L   Chloride 103 96 - 106 mmol/L   CO2 25 20 - 29 mmol/L   Calcium 9.5 8.6 - 10.2 mg/dL    Radiology: DG Chest 2 View  Result Date: 05/17/2021 CLINICAL DATA:  Chest pain. EXAM: CHEST - 2 VIEW COMPARISON:  None. FINDINGS: The heart size and mediastinal contours are within normal limits. Both lungs are clear. The visualized skeletal structures are unremarkable. IMPRESSION: No active cardiopulmonary disease. Electronically Signed   By: Marijo Conception M.D.   On: 05/17/2021 09:20   CARDIAC CATHETERIZATION  Result Date: 05/18/2021   Prox RCA to Mid RCA lesion is 100% stenosed.   Mid LAD lesion is 30% stenosed.   Mid Cx to Dist Cx lesion is 60% stenosed. 1.  Significant two-vessel coronary artery disease.  Chronically occluded proximal to mid right coronary artery with well-developed left-to-right collaterals.  There is also 60% stenosis in the mid to distal left circumflex. 2.  Left ventricular angiography was not performed.  EF was normal by echo. 3.  Normal left ventricular end-diastolic pressure. Recommendations: No clear culprit is identified for non-STEMI.  The RCA occlusion appears to be chronic  with well-developed collaterals.  Cannot completely exclude possible plaque rupture in the mid to distal left circumflex but the lesion does not appear to be obstructive at this point. Recommend medical therapy.  I added clopidogrel for 1 year.   ECHOCARDIOGRAM COMPLETE  Result Date: 05/17/2021    ECHOCARDIOGRAM REPORT   Patient Name:   [name] SUSTAITA Date of Exam: 05/17/2021 Medical Rec #:  [id number]  Height:       73.0 in Accession #:    [id number]        Weight:       215.0 lb Date of Birth:  1956-09-05         BSA:          2.219 m Patient Age:    65 years          BP:           134/83 mmHg Patient Gender: M                 HR:           60 bpm. Exam Location:  [facility] Procedure: 2D Echo Indications:     NSTEMI  History:         Patient has prior history of Echocardiogram examinations. COPD                  and Emphysema.  Sonographer:     Wallace Keller Thornton-Maynard Referring Phys:  [id number] AMY N COX Diagnosing Phys: Ida Rogue MD IMPRESSIONS  1. Left ventricular ejection fraction, by estimation, is 55 to 60%. The left ventricle has normal function. The left ventricle has no regional wall motion abnormalities. Left ventricular diastolic parameters are consistent with Grade I diastolic dysfunction (impaired relaxation).  2. Right ventricular systolic function is normal. The right ventricular size is normal. Tricuspid regurgitation signal is inadequate for assessing PA pressure.  3. Left atrial size was mildly dilated. FINDINGS  Left Ventricle: Left ventricular ejection fraction, by estimation, is 55 to 60%. The left ventricle has normal function. The left ventricle has no regional wall motion abnormalities. The left ventricular internal cavity size was normal in size. There is  no left ventricular hypertrophy. Left ventricular diastolic parameters are consistent with Grade I diastolic dysfunction (impaired relaxation). Right Ventricle: The right ventricular size is normal. No increase in right  ventricular wall thickness. Right ventricular systolic function is normal. Tricuspid regurgitation signal is inadequate for assessing PA pressure. Left Atrium: Left atrial size was mildly dilated. Right Atrium: Right atrial size was normal in size. Pericardium: There is no evidence of pericardial effusion. Mitral Valve: The mitral valve is normal in structure. No evidence of mitral valve regurgitation. No evidence of mitral valve stenosis. Tricuspid Valve: The tricuspid valve is normal in structure. Tricuspid valve regurgitation is not demonstrated. No evidence of tricuspid stenosis. Aortic Valve: The aortic valve was not well visualized. Aortic valve regurgitation is not visualized. No aortic stenosis is present. Aortic valve mean gradient measures 3.0 mmHg. Aortic valve peak gradient measures 6.4 mmHg. Aortic valve area, by VTI measures 3.09 cm. Pulmonic Valve: The pulmonic valve was normal in structure. Pulmonic valve regurgitation is not visualized. No evidence of pulmonic stenosis. Aorta: The aortic root is normal in size and structure. Venous: The inferior vena cava is normal in size with greater than 50% respiratory variability, suggesting right atrial pressure of 3 mmHg. IAS/Shunts: No atrial level shunt detected by color flow Doppler.  LEFT VENTRICLE PLAX 2D LVIDd:         5.38 cm  Diastology LVIDs:         4.17 cm  LV e' medial:    3.45 cm/s LV PW:         1.01 cm  LV E/e' medial:  16.9 LV IVS:        0.89 cm  LV e' lateral:   7.13 cm/s LVOT diam:  2.20 cm  LV E/e' lateral: 8.2 LV SV:         82 LV SV Index:   37 LVOT Area:     3.80 cm  RIGHT VENTRICLE RV S prime:     11.00 cm/s TAPSE (M-mode): 2.8 cm LEFT ATRIUM             Index LA diam:        4.10 cm 1.85 cm/m LA Vol (A2C):   40.1 ml 18.07 ml/m LA Vol (A4C):   29.9 ml 13.48 ml/m LA Biplane Vol: 35.0 ml 15.77 ml/m  AORTIC VALVE                   PULMONIC VALVE AV Area (Vmax):    2.68 cm    PV Vmax:       0.88 m/s AV Area (Vmean):   2.89 cm     PV Peak grad:  3.1 mmHg AV Area (VTI):     3.09 cm AV Vmax:           126.00 cm/s AV Vmean:          83.700 cm/s AV VTI:            0.266 m AV Peak Grad:      6.4 mmHg AV Mean Grad:      3.0 mmHg LVOT Vmax:         88.80 cm/s LVOT Vmean:        63.600 cm/s LVOT VTI:          0.216 m LVOT/AV VTI ratio: 0.81  AORTA Ao Root diam: 3.70 cm MITRAL VALVE MV Area (PHT): 2.50 cm    SHUNTS MV E velocity: 58.30 cm/s  Systemic VTI:  0.22 m MV A velocity: 61.70 cm/s  Systemic Diam: 2.20 cm MV E/A ratio:  0.94 Ida Rogue MD Electronically signed by Ida Rogue MD Signature Date/Time: 05/17/2021/10:30:17 PM    Final     No results found.  VAS [place] LOWER EXTREMITY VENOUS (DVT)  Result Date: 06/19/2021  Lower Venous DVT Study Patient Name:  BRESLIN HEMANN  Date of Exam:   06/18/2021 Medical Rec #: [id number]          Accession #:    [id number] Date of Birth: November 26, 1955          Patient Gender: M Patient Age:   35 years Exam Location:  [facility] Procedure:      VAS [place] LOWER EXTREMITY VENOUS (DVT) Referring Phys: Marrianne Mood --------------------------------------------------------------------------------  Other Indications: Right calf pain began about one month ago after driving for                    an hour and a half. Pain persisted on his right, up until a                    few days ago. At that point, the pain moved to his left calf                    and now his right leg is pain-free. Risk Factors: Recent extended travel. Comparison Study: No previous Performing Technologist: Pilar Jarvis RDMS, RVT, RDCS  Examination Guidelines: A complete evaluation includes B-mode imaging, spectral Doppler, color Doppler, and power Doppler as needed of all accessible portions of each vessel. Bilateral testing is considered an integral part of a complete examination. Limited examinations for reoccurring indications may be performed as noted. The reflux portion  of the exam is performed with the patient in reverse  Trendelenburg.  +---------+---------------+---------+-----------+----------+--------------+ RIGHT    CompressibilityPhasicitySpontaneityPropertiesThrombus Aging +---------+---------------+---------+-----------+----------+--------------+ CFV      Full           Yes      Yes                                 +---------+---------------+---------+-----------+----------+--------------+ SFJ      Full           Yes      Yes                                 +---------+---------------+---------+-----------+----------+--------------+ FV Prox  Full           Yes      Yes                                 +---------+---------------+---------+-----------+----------+--------------+ FV Mid   Full           Yes      Yes                                 +---------+---------------+---------+-----------+----------+--------------+ FV DistalFull           Yes      Yes                                 +---------+---------------+---------+-----------+----------+--------------+ PFV      Full           Yes      Yes                                 +---------+---------------+---------+-----------+----------+--------------+ POP      Full           Yes      Yes                                 +---------+---------------+---------+-----------+----------+--------------+ PTV      Full           Yes      Yes                                 +---------+---------------+---------+-----------+----------+--------------+ PERO     Full           Yes      Yes                                 +---------+---------------+---------+-----------+----------+--------------+ Soleal   Full                                                        +---------+---------------+---------+-----------+----------+--------------+ Gastroc  Full                                                        +---------+---------------+---------+-----------+----------+--------------+  GSV      Full           Yes       Yes                                 +---------+---------------+---------+-----------+----------+--------------+ SSV      Full                                                        +---------+---------------+---------+-----------+----------+--------------+   +---------+---------------+---------+-----------+----------+--------------+ LEFT     CompressibilityPhasicitySpontaneityPropertiesThrombus Aging +---------+---------------+---------+-----------+----------+--------------+ CFV      Full           Yes      Yes                                 +---------+---------------+---------+-----------+----------+--------------+ SFJ      Full           Yes      Yes                                 +---------+---------------+---------+-----------+----------+--------------+ FV Prox  Full           Yes      Yes                                 +---------+---------------+---------+-----------+----------+--------------+ FV Mid   Full           Yes      Yes                                 +---------+---------------+---------+-----------+----------+--------------+ FV DistalFull           Yes      Yes                                 +---------+---------------+---------+-----------+----------+--------------+ PFV      Full           Yes      Yes                                 +---------+---------------+---------+-----------+----------+--------------+ POP      Full           Yes      Yes                                 +---------+---------------+---------+-----------+----------+--------------+ PTV      Full           Yes      Yes                                 +---------+---------------+---------+-----------+----------+--------------+ PERO     Full           Yes  Yes                                 +---------+---------------+---------+-----------+----------+--------------+ Soleal   Full                                                         +---------+---------------+---------+-----------+----------+--------------+ Gastroc  Full                                                        +---------+---------------+---------+-----------+----------+--------------+ GSV      Full           Yes      Yes                                 +---------+---------------+---------+-----------+----------+--------------+ SSV      Full                                                        +---------+---------------+---------+-----------+----------+--------------+     Summary: BILATERAL: - No evidence of deep vein thrombosis seen in the lower extremities, bilaterally. -No evidence of popliteal cyst, bilaterally.   *See table(s) above for measurements and observations. Electronically signed by Ida Rogue MD on 06/19/2021 at 7:41:27 PM.    Final       Assessment and Plan: Patient Active Problem List   Diagnosis Date Noted   Coronary artery disease involving native coronary artery of native heart without angina pectoris    NSTEMI (non-ST elevated myocardial infarction) ([place]) 05/17/2021   Centrilobular emphysema ([place]) 04/07/2021   Cigarette nicotine dependence with nicotine-induced disorder 04/07/2021   Depression, major, single episode, mild ([place]) 04/07/2021   History of colonic polyps    Polyp of sigmoid colon    Rectal polyp    Elevated PSA 12/26/2019   Benign prostatic hyperplasia with urinary frequency 12/26/2019    1. Chronic obstructive pulmonary disease, unspecified COPD type ([place]) Moderate disease unfortunately continues to smoke had a very lengthy discussion with him regarding smoking cessation.  Patient states that he is going to work on smoking cessation however he does enjoy cigarettes  2. Cigarette nicotine dependence with nicotine-induced disorder STOP SMOKING. Also will need 1 year follow up CT screening  3. SOB (shortness of breath) Secondary to COPD and ongoing tobacco use  General Counseling: I have discussed  the findings of the evaluation and examination with [facility].  I have also discussed any further diagnostic evaluation thatmay be needed or ordered today. Symon verbalizes understanding of the findings of todays visit. We also reviewed his medications today and discussed drug interactions and side effects including but not limited excessive drowsiness and altered mental states. We also discussed that there is always a risk not just to him but also people around him. he has been encouraged to call the office with any questions or concerns that should arise related to todays  visit.  No orders of the defined types were placed in this encounter.    Time spent: 67  I have personally obtained a history, examined the patient, evaluated laboratory and imaging results, formulated the assessment and plan and placed orders.    Allyne Gee, MD [facility] Pulmonary and Critical Care Sleep medicine

## 2021-07-07 ENCOUNTER — Encounter: Payer: Self-pay | Admitting: Nurse Practitioner

## 2021-07-08 ENCOUNTER — Other Ambulatory Visit: Payer: Self-pay | Admitting: Nurse Practitioner

## 2021-07-08 ENCOUNTER — Encounter: Payer: Self-pay | Admitting: Gastroenterology

## 2021-07-08 ENCOUNTER — Ambulatory Visit: Payer: Medicare Other | Admitting: Gastroenterology

## 2021-07-08 ENCOUNTER — Other Ambulatory Visit: Payer: Self-pay

## 2021-07-08 VITALS — BP 109/70 | HR 56 | Temp 98.3°F | Wt 214.0 lb

## 2021-07-08 DIAGNOSIS — K635 Polyp of colon: Secondary | ICD-10-CM

## 2021-07-08 MED ORDER — TOLTERODINE TARTRATE ER 4 MG PO CP24: 4.0000 mg | ORAL_CAPSULE | Freq: Every day | ORAL | 1 refills | Status: DC

## 2021-07-08 MED ORDER — VARENICLINE TARTRATE 1 MG PO TABS: 1.0000 mg | ORAL_TABLET | Freq: Two times a day (BID) | ORAL | 0 refills | Status: DC

## 2021-07-08 NOTE — Telephone Encounter (Signed)
Only for try on so fill 60 tab pres was already send

## 2021-07-09 ENCOUNTER — Encounter: Payer: Self-pay | Admitting: Nurse Practitioner

## 2021-07-09 NOTE — Progress Notes (Signed)
Vonda Antigua, MD [address]  [place]  [place], [place] [address]  Main: [phone]  Fax: [phone]   Primary Care Physician: Jonetta Osgood, NP   Chief Complaint  Patient presents with   Follow-up    3 month.... Pt denies any new concerns at this time     HPI: [name] [name] is a 65 y.o. male who was recently seen as an outpatient for a colonoscopy for history of colon polyps presents for follow-up.  3 subcentimeter polyps were removed, nonbleeding internal hemorrhoids were seen.  Prior to this colonoscopy.  Patient states he had colonoscopies in [place] and as per H&P done on the day of his procedure "States last colonoscopy was 4 years ago and remembers that there were clips placed at the time. States he was advised to have a repeat colonoscopy in 2 yrs after that. Reports having a normal colonoscopy before that."  Pathology from the most recent colonoscopy showed tubular adenoma, polypoid benign colonic mucosa, and polypoid benign colorectal mucosa.  As per my note attached to his pathology report  "Patient's previous colonoscopy or pathology records are not available as these were [place] out of state.   However, patient states that after his last colonoscopy he was advised to have a repeat colonoscopy in 2 years and clips were placed at that colonoscopy.  This may have represented a large polyp requiring clip placement, and a 2-year follow-up at that time.   Based on new guidelines from 2020, patients with a baseline finding of "Adenoma >or = 10 mm in size; or adenoma with tubulovillous/villous histology; or adenoma with high-grade dysplasia; or 5 to 10 adenomas <10 mm", should have for surveillance colonoscopy in 3 years.  After that, if 1-2 tubular adenomas less than 10 mm are found, second surveillance can be done in 5 years.   Given that at this time we do not have his previous procedure reports, I will recommend 5-year surveillance based on the above  guidelines.  However, we will try to obtain his previous records for further evaluation as well and change this recommendation as appropriate"  Patient states he does not have the records from his previous colonoscopy report at home, but was able to gave [place] the name of the facility where his colonoscopy may have been done in [place].  The patient denies abdominal or flank pain, anorexia, nausea or vomiting, dysphagia, change in bowel habits or black or bloody stools or weight loss.    ROS: All ROS reviewed and negative except as per HPI   Past Medical History:  Diagnosis Date   Arthritis    COPD (chronic obstructive pulmonary disease) (HCC)    Depression    Emphysema of lung ([place])    Gout    left knee   Heart attack [facility])     Past Surgical History:  Procedure Laterality Date   CARDIAC CATHETERIZATION     COLONOSCOPY WITH PROPOFOL N/A 04/07/2021   Procedure: COLONOSCOPY WITH PROPOFOL;  Surgeon: Virgel Manifold, MD;  Location: [facility] ENDOSCOPY;  Service: Endoscopy;  Laterality: N/A;   LEFT HEART CATH AND CORONARY ANGIOGRAPHY N/A 05/18/2021   Procedure: LEFT HEART CATH AND CORONARY ANGIOGRAPHY;  Surgeon: Wellington Hampshire, MD;  Location: [facility];  Service: Cardiovascular;  Laterality: N/A;    Prior to Admission medications   Medication Sig Start Date End Date Taking? Authorizing Provider  albuterol (VENTOLIN HFA) 108 (90 Base) MCG/ACT inhaler Inhale 2 puffs into the lungs every 6 (  six) hours as needed for wheezing or shortness of breath. 03/16/21  Yes Devona Konig A, MD  allopurinol (ZYLOPRIM) 100 MG tablet Take 2 tab po daily 11/26/20  Yes Luiz Ochoa, NP  aspirin EC 81 MG EC tablet Take 1 tablet (81 mg total) by mouth daily. Swallow whole. 05/19/21  Yes Fritzi Mandes, MD  atorvastatin (LIPITOR) 80 MG tablet Take 1 tablet (80 mg total) by mouth daily. 05/19/21  Yes Fritzi Mandes, MD  buPROPion [facility] SR) 150 MG 12 hr tablet Take 150 mg by mouth 2 (two) times daily.  05/22/21  Yes [provider]  clopidogrel (PLAVIX) 75 MG tablet Take 1 tablet (75 mg total) by mouth daily with breakfast. 05/19/21  Yes Fritzi Mandes, MD  Fluticasone-Umeclidin-Vilant (TRELEGY ELLIPTA) 100-62.5-25 MCG/INH AEPB Inhale 1 puff into the lungs daily. 03/16/21  Yes Allyne Gee, MD  indomethacin (INDOCIN) 50 MG capsule Take 50 mg by mouth at bedtime. 05/01/21  Yes [provider]  meloxicam (MOBIC) 7.5 MG tablet TAKE 1 TABLET(7.5 MG) BY MOUTH DAILY 04/22/21  Yes Abernathy, Alyssa, NP  metoprolol tartrate (LOPRESSOR) 25 MG tablet Take 0.5 tablets (12.5 mg total) by mouth 2 (two) times daily. 05/20/21  Yes Abernathy, Yetta Flock, NP  sertraline (ZOLOFT) 25 MG tablet Take 1 tablet (25 mg total) by mouth daily. 05/20/21  Yes Abernathy, Yetta Flock, NP  sildenafil (VIAGRA) 100 MG tablet Take 1 tablet (100 mg total) by mouth daily as needed for erectile dysfunction. 11/28/20  Yes Luiz Ochoa, NP  tamsulosin (FLOMAX) 0.4 MG CAPS capsule Take 0.4 mg by mouth daily. 04/23/21  Yes [provider]  tolterodine (DETROL LA) 4 MG 24 hr capsule Take 1 capsule (4 mg total) by mouth daily. 07/08/21  Yes Abernathy, Yetta Flock, NP  traMADol (ULTRAM) 50 MG tablet Take 1 tablet (50 mg total) by mouth every 12 (twelve) hours as needed for moderate pain or severe pain. 06/04/21  Yes Abernathy, Yetta Flock, NP  varenicline (CHANTIX) 1 MG tablet Take 1 tablet (1 mg total) by mouth 2 (two) times daily. 07/08/21  Yes Jonetta Osgood, NP    Family History  Problem Relation Age of Onset   Alcohol abuse Mother    Alcohol abuse Father    Alcohol abuse Brother      Social History   Tobacco Use   Smoking status: Every Day    Packs/day: 2.00    Years: 52.00    Pack years: 104.00    Types: Cigarettes   Smokeless tobacco: Never   Tobacco comments:    10 a day currently  Vaping Use   Vaping Use: Never used  Substance Use Topics   Alcohol use: Not Currently   Drug use: Never    Allergies as of  07/08/2021   (No Known Allergies)    Physical Examination:  Constitutional: General:   Alert,  Well-developed, well-nourished, pleasant and cooperative in NAD BP 109/70   Pulse (!) 56   Temp 98.3 F (36.8 C) (Oral)   Wt 214 lb (97.1 kg)   BMI 28.23 kg/m   Respiratory: Normal respiratory effort  Gastrointestinal:  Soft, non-tender and non-distended without masses, hepatosplenomegaly or hernias noted.  No guarding or rebound tenderness.     Cardiac: No clubbing or edema.  No cyanosis. Normal posterior tibial pedal pulses noted.  Psych:  Alert and cooperative. Normal mood and affect.  Musculoskeletal:  Normal gait. Head normocephalic, atraumatic. Symmetrical without gross deformities. 5/5 Lower extremity strength bilaterally.  Skin: Warm. Intact without significant  lesions or rashes. No jaundice.  Neck: Supple, trachea midline  Lymph: No cervical lymphadenopathy  Psych:  Alert and oriented x3, Alert and cooperative. Normal mood and affect.  Labs: CMP     Component Value Date/Time   NA 143 05/20/2021 1357   K 4.9 05/20/2021 1357   CL 103 05/20/2021 1357   CO2 25 05/20/2021 1357   GLUCOSE 93 05/20/2021 1357   GLUCOSE 112 (H) 05/18/2021 0424   BUN 15 05/20/2021 1357   CREATININE 1.18 05/20/2021 1357   CALCIUM 9.5 05/20/2021 1357   PROT 6.8 10/31/2020 1637   ALBUMIN 4.4 10/31/2020 1637   AST 22 10/31/2020 1637   ALT 24 10/31/2020 1637   ALKPHOS 76 10/31/2020 1637   BILITOT 0.3 10/31/2020 1637   GFRNONAA >60 05/18/2021 0424   GFRAA 76 10/31/2020 1637   Lab Results  Component Value Date   WBC 6.6 05/19/2021   HGB 13.8 05/19/2021   HCT 39.8 05/19/2021   MCV 88.6 05/19/2021   PLT 155 05/19/2021    Imaging Studies:   Assessment and Plan:   [name] [name] is a 65 y.o. y/o male who underwent recent colonoscopy for history of polyps, here for follow-up  We have been unable to locate his previous colonoscopy reports and records and patient does not have  these at home  We will attempt to obtain the records by sending a record release request to the facility in [place] where he thinks they may have been done  Until we obtain those records, we will plan on placing a 5-year surveillance in his chart, given that he describes having a colonoscopy where clips were placed previously after a polyp removal and repeat was recommended in 2 years after that.  Since he may have had an advanced polyp at that time, and has only had small polyps in this colonoscopy, 5-year surveillance is reasonable.  However, once we obtain previous records, this can be changed.  Patient is agreeable with the plan    Dr Vonda Antigua

## 2021-07-10 ENCOUNTER — Telehealth: Payer: Self-pay

## 2021-07-10 ENCOUNTER — Other Ambulatory Visit: Payer: Self-pay | Admitting: Internal Medicine

## 2021-07-10 DIAGNOSIS — J449 Chronic obstructive pulmonary disease, unspecified: Secondary | ICD-10-CM

## 2021-07-10 NOTE — Telephone Encounter (Signed)
Called pt and LMOM to stop lopressor until seen by cardiology and to start taking the zoloft at night. Also to restart the wellbutrin in the morning.  Advised for pt to call us back Monday after 830 am

## 2021-07-15 ENCOUNTER — Other Ambulatory Visit (INDEPENDENT_AMBULATORY_CARE_PROVIDER_SITE_OTHER): Payer: Medicare Other

## 2021-07-15 ENCOUNTER — Other Ambulatory Visit: Payer: Self-pay

## 2021-07-15 DIAGNOSIS — I2510 Atherosclerotic heart disease of native coronary artery without angina pectoris: Secondary | ICD-10-CM | POA: Diagnosis not present

## 2021-07-16 LAB — LIPID PANEL
Chol/HDL Ratio: 3.3 ratio (ref 0.0–5.0)
Cholesterol, Total: 108 mg/dL (ref 100–199)
HDL: 33 mg/dL — ABNORMAL LOW (ref >39)
LDL Chol Calc (NIH): 59 mg/dL (ref 0–99)
Triglycerides: 80 mg/dL (ref 0–149)
VLDL Cholesterol Cal: 16 mg/dL (ref 5–40)

## 2021-07-16 LAB — LDL CHOLESTEROL, DIRECT: LDL Direct: 55 mg/dL (ref 0–99)

## 2021-07-17 ENCOUNTER — Ambulatory Visit: Payer: Medicare Other | Admitting: Physician Assistant

## 2021-07-17 ENCOUNTER — Encounter: Payer: Self-pay | Admitting: Physician Assistant

## 2021-07-17 ENCOUNTER — Other Ambulatory Visit: Payer: Self-pay

## 2021-07-17 VITALS — BP 100/70 | HR 55 | Ht 73.0 in | Wt 214.0 lb

## 2021-07-17 DIAGNOSIS — M79604 Pain in right leg: Secondary | ICD-10-CM | POA: Diagnosis not present

## 2021-07-17 DIAGNOSIS — J432 Centrilobular emphysema: Secondary | ICD-10-CM

## 2021-07-17 DIAGNOSIS — F17219 Nicotine dependence, cigarettes, with unspecified nicotine-induced disorders: Secondary | ICD-10-CM

## 2021-07-17 DIAGNOSIS — M79605 Pain in left leg: Secondary | ICD-10-CM

## 2021-07-17 DIAGNOSIS — I2510 Atherosclerotic heart disease of native coronary artery without angina pectoris: Secondary | ICD-10-CM | POA: Diagnosis not present

## 2021-07-17 DIAGNOSIS — E785 Hyperlipidemia, unspecified: Secondary | ICD-10-CM

## 2021-07-17 DIAGNOSIS — I252 Old myocardial infarction: Secondary | ICD-10-CM

## 2021-07-17 NOTE — Patient Instructions (Signed)
Medication Instructions:  - Your physician has recommended you make the following change in your medication:   1) STOP lopressor (metoprolol tartrate)  *If you need a refill on your cardiac medications before your next appointment, please call your pharmacy*   Lab Work: - Your physician recommends that you have lab work today: [place] LDL   If you have labs (blood work) drawn today and your tests are completely normal, you will receive your results only by: [employer] (if you have MyChart) OR A paper copy in the mail If you have any lab test that is abnormal or we need to change your treatment, we will call you to review the results.   Testing/Procedures: - none ordered   Follow-Up: At [facility], you and your health needs are our priority.  As part of our continuing mission to provide you with exceptional heart care, we have created designated Provider Care Teams.  These Care Teams include your primary Cardiologist (physician) and Advanced Practice Providers (APPs -  Physician Assistants and Nurse Practitioners) who all work together to provide you with the care you need, when you need it.  We recommend signing up for the patient portal called "MyChart".  Sign up information is provided on this After Visit Summary.  MyChart is used to connect with patients for Virtual Visits (Telemedicine).  Patients are able to view lab/test results, encounter notes, upcoming appointments, etc.  Non-urgent messages can be sent to your provider as well.   To learn more about what you can do with MyChart, go to [email or web address].    Your next appointment:   3 month(s)  The format for your next appointment:   In Person  Provider:   You may see Kathlyn Sacramento, MD or one of the following Advanced Practice Providers on your designated Care Team:   Murray Hodgkins, NP Christell Faith, PA-C Marrianne Mood, PA-C Cadence Kathlen Mody, [place]   Other Instructions  N/a

## 2021-07-17 NOTE — Progress Notes (Signed)
Office Visit    Patient Name: [name] [name] Date of Encounter: 07/17/2021  PCP:  Jonetta Osgood, NP   [place]  Cardiologist:  Dr. Fletcher Anon Advanced Practice Provider:  No care team member to display Electrophysiologist:  None  :[id number]}   Chief Complaint    Chief Complaint  Patient presents with   Follow-up    1 Month follow up, per patient he stopped taking Lopressor because it was making him sleepy. Medications verbally reviewed with patient.     65 y.o. male with history of CAD s/p recent non-STEMI, COPD, ongoing tobacco use of one half to half pack daily x50 years, gout, depression, and seen today for follow-up of recent non-STEMI.  Past Medical History    Past Medical History:  Diagnosis Date   Arthritis    COPD (chronic obstructive pulmonary disease) ([place])    Depression    Emphysema of lung ([place])    Gout    left knee   Heart attack [facility])    Past Surgical History:  Procedure Laterality Date   CARDIAC CATHETERIZATION     COLONOSCOPY WITH PROPOFOL N/A 04/07/2021   Procedure: COLONOSCOPY WITH PROPOFOL;  Surgeon: Virgel Manifold, MD;  Location: [facility] ENDOSCOPY;  Service: Endoscopy;  Laterality: N/A;   LEFT HEART CATH AND CORONARY ANGIOGRAPHY N/A 05/18/2021   Procedure: LEFT HEART CATH AND CORONARY ANGIOGRAPHY;  Surgeon: Wellington Hampshire, MD;  Location: [place] [facility];  Service: Cardiovascular;  Laterality: N/A;    Allergies  No Known Allergies  History of Present Illness    [name] [name] is a 65 y.o. male with PMH as above.  Prior to his most recent [facility] admission, he had no previously known cardiac history.  He reported previous echo and stress testing in [place] years ago.  11/2020 echo to PCP office 11/2020 showed preserved LV SF.  His cat passed away the week before his admission.  Prior to admission, he developed substernal chest pain that radiated down his left arm and lasted approximately 15 minutes until he  fell asleep.  The following day, he noted some dyspnea.  He presented to [facility] where EKG was without acute ST/T changes and high-sensitivity troponin elevated.  Subsequent LHC 8/29 showed significant two-vessel CAD with chronically occluded proximal to mid RCA and well-developed left-to-right collaterals.  There was 60% stenosis in the mid and distal left circumflex.  LVEF normal by echo.  Normal LVEDP.  No clear culprit was identified for the non-STEMI and suspected due to supply demand mismatch in the setting of fixed CAD involving the left circumflex and RCA..  It was noted possible plaque rupture in the mid to distal left circumflex was not able to be excluded though the lesion did not appear obstructed.  Clopidogrel was added to ASA for at least 1 year.  Seen 06/15/2021 with ongoing dyspnea on steps and RLE calf pain for the past month.  No erythema or warmth.  He had a long car trip to [facility], though this occurred after the pain started.  He wondered if it was connected to his gout.  Smoking 10 to 12 cigarettes/day. Labs were collected, including lipids. [place] of LE performed and negative for DVT.   Today, 07/17/21, he returns to clinic and notes that he is overall doing well from a cardiac standpoint. No CP or SOB at rest. His DOE has improved on the stairs. Some presyncope noted today. He still gets pain in his calf muscles when driving  and usually has to pull over and walk it off. No LEE, orthopnea, or PND. He has cut down to 8-10 cigarettes per day. 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   Home Medications   Current Outpatient Medications  Medication Instructions   albuterol (VENTOLIN HFA) 108 (90 Base) MCG/ACT inhaler 2 puffs, Inhalation, Every 6 hours PRN   allopurinol (ZYLOPRIM) 100 MG tablet Take 2 tab po daily   aspirin 81 mg, Oral, Daily, Swallow whole.   atorvastatin (LIPITOR) 80 mg, Oral, Daily   buPROPion (WELLBUTRIN SR) 150 mg,  Oral, 2 times daily   clopidogrel (PLAVIX) 75 mg, Oral, Daily with breakfast   Fluticasone-Umeclidin-Vilant (TRELEGY ELLIPTA) 100-62.5-25 MCG/INH AEPB 1 puff, Inhalation, Daily   indomethacin (INDOCIN) 50 mg, Oral, Daily at bedtime   meloxicam (MOBIC) 7.5 MG tablet TAKE 1 TABLET(7.5 MG) BY MOUTH DAILY   sertraline (ZOLOFT) 25 mg, Oral, Daily   sildenafil (VIAGRA) 100 mg, Oral, Daily PRN   tamsulosin (FLOMAX) 0.4 mg, Oral, Daily   tolterodine (DETROL LA) 4 mg, Oral, Daily   traMADol (ULTRAM) 50 mg, Oral, Every 12 hours PRN   varenicline (CHANTIX) 1 mg, Oral, 2 times daily     Review of Systems    He reports cramps in his legs when driving.  He reports improved DOE.  He denies chest pain, palpitations, pnd, orthopnea, n, v,, syncope, edema, weight gain, or early satiety.  Some presyncope noted.  All other systems reviewed and are otherwise negative except as noted above.  Physical Exam    VS:  BP 100/70 (BP Location: Left Arm, Patient Position: Sitting, Cuff Size: Normal)   Pulse (!) 55   Ht 6\' 1"  (1.854 m)   Wt 214 lb (97.1 kg)   SpO2 98%   BMI 28.23 kg/m  , BMI Body mass index is 28.23 kg/m. GEN: Well nourished, well developed, in no acute distress. HEENT: normal. Neck: Supple, no JVD, carotid bruits, or masses. Cardiac: RRR, no murmurs, rubs, or gallops. No clubbing, cyanosis, edema.  Radials/DP/PT 2+ and equal bilaterally.   Respiratory:  Respirations regular and unlabored, clear to auscultation bilaterally. GI: Soft, nontender, nondistended, BS + x 4. MS: no deformity or atrophy. Skin: warm and dry, no rash. Neuro:  Strength and sensation are intact. Psych: Normal affect.  Accessory Clinical Findings    ECG personally reviewed by me today -sinus bradycardia, 55 bpm, TWI III, and poor R wave progression in II, III, avF with LAD- no acute changes.  VITALS Reviewed today   Temp Readings from Last 3 Encounters:  07/08/21 98.3 F (36.8 C) (Oral)  07/06/21 98 F (36.7 C)   07/03/21 98.1 F (36.7 C)   BP Readings from Last 3 Encounters:  07/17/21 100/70  07/08/21 109/70  07/06/21 100/60   Pulse Readings from Last 3 Encounters:  07/17/21 (!) 55  07/08/21 (!) 56  07/06/21 (!) 58    Wt Readings from Last 3 Encounters:  07/17/21 214 lb (97.1 kg)  07/08/21 214 lb (97.1 kg)  07/06/21 214 lb (97.1 kg)     LABS  reviewed today   Lab Results  Component Value Date   WBC 6.6 05/19/2021   HGB 13.8 05/19/2021   HCT 39.8 05/19/2021   MCV 88.6 05/19/2021   PLT 155 05/19/2021   Lab Results  Component Value Date   CREATININE 1.18 05/20/2021   BUN 15 05/20/2021   NA 143 05/20/2021  K 4.9 05/20/2021   CL 103 05/20/2021   CO2 25 05/20/2021   Lab Results  Component Value Date   ALT 24 10/31/2020   AST 22 10/31/2020   ALKPHOS 76 10/31/2020   BILITOT 0.3 10/31/2020   Lab Results  Component Value Date   CHOL 108 07/15/2021   HDL 33 (L) 07/15/2021   LDLCALC 59 07/15/2021   LDLDIRECT 55 07/15/2021   TRIG 80 07/15/2021   CHOLHDL 3.3 07/15/2021    Lab Results  Component Value Date   HGBA1C 6.1 (H) 05/18/2021   Lab Results  Component Value Date   TSH 1.198 05/17/2021     STUDIES/PROCEDURES reviewed today    LHC 1.  Significant two-vessel coronary artery disease.  Chronically occluded proximal to mid right coronary artery with well-developed left-to-right collaterals.  There is also 60% stenosis in the mid to distal left circumflex. 2.  Left ventricular angiography was not performed.  EF was normal by echo. 3.  Normal left ventricular end-diastolic pressure. Recommendations: No clear culprit is identified for non-STEMI.  The RCA occlusion appears to be chronic with well-developed collaterals.  Cannot completely exclude possible plaque rupture in the mid to distal left circumflex but the lesion does not appear to be obstructive at this point. Recommend medical therapy.  I added clopidogrel for 1 year.  Echo 8/28  1. Left ventricular  ejection fraction, by estimation, is 55 to 60%. The  left ventricle has normal function. The left ventricle has no regional  wall motion abnormalities. Left ventricular diastolic parameters are  consistent with Grade I diastolic  dysfunction (impaired relaxation).   2. Right ventricular systolic function is normal. The right ventricular  size is normal. Tricuspid regurgitation signal is inadequate for assessing  PA pressure.   3. Left atrial size was mildly dilated.   Assessment & Plan    CAD/history of non-STEMI --No chest pain. S/p LHC with two-vessel CAD and no clear culprit for the non-STEMI.  Continue DAPT with ASA and clopidogrel for at least 1 year. Due to report of presyncope with soft BP and bradycardia, will trial off BB. Aggressive risk factor modification, including statin.   Lower extremity calf pain --Negative for DVT by [place]. Continue to monitor. Consider discussion with PCP.  HLD --Continue current Lipitor.  Will make sure LDL at goal below 70 today.  COPD with ongoing tobacco use --Complete cessation of tobacco advised.  Hyperglycemia --Management per PCP    *Please be aware that the above documentation was completed voice recognition software and may contain dictation errors.       Arvil Chaco, PA-C 07/17/2021

## 2021-07-18 LAB — HEPATIC FUNCTION PANEL
ALT: 20 IU/L (ref 0–44)
AST: 22 IU/L (ref 0–40)
Albumin: 4.6 g/dL (ref 3.8–4.8)
Alkaline Phosphatase: 83 IU/L (ref 44–121)
Bilirubin Total: 0.6 mg/dL (ref 0.0–1.2)
Bilirubin, Direct: 0.18 mg/dL (ref 0.00–0.40)
Total Protein: 7 g/dL (ref 6.0–8.5)

## 2021-07-18 LAB — LIPID PANEL
Chol/HDL Ratio: 3.3 ratio (ref 0.0–5.0)
Cholesterol, Total: 111 mg/dL (ref 100–199)
HDL: 34 mg/dL — ABNORMAL LOW (ref >39)
LDL Chol Calc (NIH): 56 mg/dL (ref 0–99)
Triglycerides: 111 mg/dL (ref 0–149)
VLDL Cholesterol Cal: 21 mg/dL (ref 5–40)

## 2021-07-18 LAB — LDL CHOLESTEROL, DIRECT: LDL Direct: 54 mg/dL (ref 0–99)

## 2021-07-22 ENCOUNTER — Encounter: Payer: Self-pay | Admitting: Nurse Practitioner

## 2021-07-22 ENCOUNTER — Ambulatory Visit: Payer: Medicare Other

## 2021-07-22 ENCOUNTER — Ambulatory Visit (INDEPENDENT_AMBULATORY_CARE_PROVIDER_SITE_OTHER): Payer: Medicare Other | Admitting: Nurse Practitioner

## 2021-07-22 ENCOUNTER — Other Ambulatory Visit: Payer: Self-pay

## 2021-07-22 VITALS — BP 108/80 | HR 70 | Temp 98.2°F | Resp 16 | Ht 73.0 in | Wt 210.8 lb

## 2021-07-22 DIAGNOSIS — R109 Unspecified abdominal pain: Secondary | ICD-10-CM | POA: Diagnosis not present

## 2021-07-22 DIAGNOSIS — R300 Dysuria: Secondary | ICD-10-CM | POA: Diagnosis not present

## 2021-07-22 LAB — POCT URINALYSIS DIPSTICK
Bilirubin, UA: NEGATIVE
Blood, UA: NEGATIVE
Glucose, UA: NEGATIVE
Leukocytes, UA: NEGATIVE
Nitrite, UA: NEGATIVE
Protein, UA: NEGATIVE
Spec Grav, UA: 1.02 (ref 1.010–1.025)
Urobilinogen, UA: 0.2 E.U./dL
pH, UA: 5 (ref 5.0–8.0)

## 2021-07-22 MED ORDER — PHENAZOPYRIDINE HCL 200 MG PO TABS: 200.0000 mg | ORAL_TABLET | Freq: Three times a day (TID) | ORAL | 0 refills | Status: DC | PRN

## 2021-07-22 NOTE — Progress Notes (Signed)
[facility] [place], [place] [address]  Internal MEDICINE  Office Visit Note  Patient Name: [name] [name]  [id number]  [id number]  Date of Service: 07/22/2021  Chief Complaint  Patient presents with   Acute Visit    Left flank pain, started last night, sharp pain when he bent while getting dressed      HPI [name] [name] presents for an acute sick visit for left flank pain that started last night. He reports a sharp pain when he bent over while getting dressed. He denies any urinary discomfort or suprapubic tenderness.  He denies any burning with urination, urinary frequency or urgency. He denies any hematuria.   Current Medication:  Outpatient Encounter Medications as of 07/22/2021  Medication Sig   albuterol (VENTOLIN HFA) 108 (90 Base) MCG/ACT inhaler Inhale 2 puffs into the lungs every 6 (six) hours as needed for wheezing or shortness of breath.   allopurinol (ZYLOPRIM) 100 MG tablet Take 2 tab po daily   aspirin EC 81 MG EC tablet Take 1 tablet (81 mg total) by mouth daily. Swallow whole.   buPROPion (WELLBUTRIN SR) 150 MG 12 hr tablet Take 150 mg by mouth 2 (two) times daily.   clopidogrel (PLAVIX) 75 MG tablet Take 1 tablet (75 mg total) by mouth daily with breakfast.   Fluticasone-Umeclidin-Vilant (TRELEGY ELLIPTA) 100-62.5-25 MCG/INH AEPB Inhale 1 puff into the lungs daily.   indomethacin (INDOCIN) 50 MG capsule Take 50 mg by mouth at bedtime. (Patient not taking: Reported on 08/10/2021)   meloxicam (MOBIC) 7.5 MG tablet TAKE 1 TABLET(7.5 MG) BY MOUTH DAILY   phenazopyridine (PYRIDIUM) 200 MG tablet Take 1 tablet (200 mg total) by mouth 3 (three) times daily as needed for pain.   sildenafil (VIAGRA) 100 MG tablet Take 1 tablet (100 mg total) by mouth daily as needed for erectile dysfunction.   tamsulosin (FLOMAX) 0.4 MG CAPS capsule Take 0.4 mg by mouth daily.   tolterodine (DETROL LA) 4 MG 24 hr capsule Take 1 capsule (4 mg total) by mouth daily.    traMADol (ULTRAM) 50 MG tablet Take 1 tablet (50 mg total) by mouth every 12 (twelve) hours as needed for moderate pain or severe pain.   [DISCONTINUED] atorvastatin (LIPITOR) 80 MG tablet Take 1 tablet (80 mg total) by mouth daily. (Patient not taking: Reported on 08/03/2021)   [DISCONTINUED] sertraline (ZOLOFT) 25 MG tablet Take 1 tablet (25 mg total) by mouth daily. (Patient taking differently: Take 25 mg by mouth daily. Pt is taking half a tab)   [DISCONTINUED] varenicline (CHANTIX) 1 MG tablet Take 1 tablet (1 mg total) by mouth 2 (two) times daily.   No facility-administered encounter medications on file as of 07/22/2021.      Medical History: Past Medical History:  Diagnosis Date   Arthritis    COPD (chronic obstructive pulmonary disease) ([place])    Depression    Emphysema of lung ([facility])    Gout    left knee   Heart attack ([facility])      Vital Signs: BP 108/80   Pulse 70   Temp 98.2 F (36.8 C)   Resp 16   Ht 6\' 1"  (1.854 m)   Wt 210 lb 12.8 oz (95.6 kg)   SpO2 98%   BMI 27.81 kg/m    Review of Systems  Constitutional:  Negative for chills, fatigue and unexpected weight change.  HENT:  Negative for congestion, rhinorrhea, sneezing and sore throat.   Eyes:  Negative for redness.  Respiratory:  Negative for cough, chest tightness and shortness of breath.   Cardiovascular:  Negative for chest pain and palpitations.  Gastrointestinal:  Negative for abdominal pain, constipation, diarrhea, nausea and vomiting.  Genitourinary:  Positive for flank pain. Negative for decreased urine volume, difficulty urinating, dysuria, frequency, hematuria, penile pain, penile swelling, scrotal swelling and testicular pain.  Musculoskeletal:  Negative for arthralgias, back pain, joint swelling and neck pain.  Skin:  Negative for rash.  Neurological: Negative.  Negative for tremors and numbness.  Hematological:  Negative for adenopathy. Does not bruise/bleed easily.  Psychiatric/Behavioral:   Negative for behavioral problems (Depression), sleep disturbance and suicidal ideas. The patient is not nervous/anxious.    Physical Exam Vitals reviewed.  Constitutional:      General: He is not in acute distress.    Appearance: Normal appearance. He is normal weight. He is not ill-appearing.  HENT:     Head: Normocephalic and atraumatic.  Eyes:     Pupils: Pupils are equal, round, and reactive to light.  Cardiovascular:     Rate and Rhythm: Normal rate and regular rhythm.  Pulmonary:     Effort: Pulmonary effort is normal. No respiratory distress.  Neurological:     Mental Status: He is alert and oriented to person, place, and time.     Cranial Nerves: No cranial nerve deficit.     Coordination: Coordination normal.     Gait: Gait normal.  Psychiatric:        Mood and Affect: Mood normal.        Behavior: Behavior normal.      Assessment/Plan: 1. Acute left flank pain Ultrasound ordered to rule out kidney stones. Pyridium ordered to alleviate urinary tract discomfort.  - phenazopyridine (PYRIDIUM) 200 MG tablet; Take 1 tablet (200 mg total) by mouth 3 (three) times daily as needed for pain.  Dispense: 10 tablet; Refill: 0 - US Renal; Future  2. Dysuria Urinalysis was negative for UTI.  - POCT Urinalysis Dipstick   General Counseling: [name] [name] verbalizes understanding of the findings of todays visit and agrees with plan of treatment. I have discussed any further diagnostic evaluation that may be needed or ordered today. We also reviewed his medications today. he has been encouraged to call the office with any questions or concerns that should arise related to todays visit.    Counseling:    Orders Placed This Encounter  Procedures   US Renal   POCT Urinalysis Dipstick    Meds ordered this encounter  Medications   phenazopyridine (PYRIDIUM) 200 MG tablet    Sig: Take 1 tablet (200 mg total) by mouth 3 (three) times daily as needed for pain.    Dispense:  10 tablet     Refill:  0    Return in about 2 weeks (around 08/05/2021) for F/U, U/S @ [facility] PCP.  [place] Controlled Substance Database was reviewed by me for overdose risk score (ORS)  Time spent:20 Minutes Time spent with patient included reviewing progress notes, labs, imaging studies, and discussing plan for follow up.   This patient was seen by Jonetta Osgood, FNP-C in collaboration with Dr. Clayborn Bigness as a part of collaborative care agreement.  Skya Mccullum R. Valetta Fuller, MSN, FNP-C Internal Medicine

## 2021-08-03 ENCOUNTER — Other Ambulatory Visit: Payer: Self-pay

## 2021-08-03 ENCOUNTER — Ambulatory Visit (INDEPENDENT_AMBULATORY_CARE_PROVIDER_SITE_OTHER): Payer: Medicare Other | Admitting: Nurse Practitioner

## 2021-08-03 ENCOUNTER — Encounter: Payer: Self-pay | Admitting: Nurse Practitioner

## 2021-08-03 VITALS — BP 106/73 | HR 66 | Temp 98.0°F | Resp 16 | Ht 73.0 in | Wt 217.4 lb

## 2021-08-03 DIAGNOSIS — R109 Unspecified abdominal pain: Secondary | ICD-10-CM

## 2021-08-03 DIAGNOSIS — R10A2 Flank pain, left side: Secondary | ICD-10-CM

## 2021-08-03 DIAGNOSIS — F320 Major depressive disorder, single episode, mild: Secondary | ICD-10-CM | POA: Diagnosis not present

## 2021-08-03 DIAGNOSIS — F17219 Nicotine dependence, cigarettes, with unspecified nicotine-induced disorders: Secondary | ICD-10-CM | POA: Diagnosis not present

## 2021-08-03 MED ORDER — DULOXETINE HCL 30 MG PO CPEP
30.0000 mg | ORAL_CAPSULE | Freq: Every day | ORAL | 1 refills | Status: DC
Start: 2021-08-03 — End: 2021-09-29

## 2021-08-03 NOTE — Progress Notes (Signed)
[facility] [place], [place] [address]  Internal MEDICINE  Office Visit Note  Patient Name: [name] [name]  [id number]  [id number]  Date of Service: 08/03/2021  Chief Complaint  Patient presents with   Follow-up    Review [place], discuss meds,     HPI Hendricks presents for a follow up visit for medication review and renal ultrasound results. At his previous office visit, he had left flank pain and was worried about having a UTI. His UA was normal but since the pain could possibly come from a kidney stone, a renal ultrasound was done to rule out kidney stones. The renal ultrasound was normal except for a simple cyst in the left kidney.  He has been taking sertraline but wants to switch to a different antidepressant. He is only taking 25 mg of sertraline daily. Since this is such a low dose, patient informed that no taper is needed.     Current Medication: Outpatient Encounter Medications as of 08/03/2021  Medication Sig   DULoxetine (CYMBALTA) 30 MG capsule Take 1 capsule (30 mg total) by mouth daily.   albuterol (VENTOLIN HFA) 108 (90 Base) MCG/ACT inhaler Inhale 2 puffs into the lungs every 6 (six) hours as needed for wheezing or shortness of breath.   allopurinol (ZYLOPRIM) 100 MG tablet Take 2 tab po daily   aspirin EC 81 MG EC tablet Take 1 tablet (81 mg total) by mouth daily. Swallow whole.   buPROPion (WELLBUTRIN SR) 150 MG 12 hr tablet Take 150 mg by mouth 2 (two) times daily.   Fluticasone-Umeclidin-Vilant (TRELEGY ELLIPTA) 100-62.5-25 MCG/INH AEPB Inhale 1 puff into the lungs daily.   indomethacin (INDOCIN) 50 MG capsule Take 50 mg by mouth at bedtime. (Patient not taking: Reported on 08/10/2021)   phenazopyridine (PYRIDIUM) 200 MG tablet Take 1 tablet (200 mg total) by mouth 3 (three) times daily as needed for pain.   sildenafil (VIAGRA) 100 MG tablet Take 1 tablet (100 mg total) by mouth daily as needed for erectile dysfunction.   tamsulosin (FLOMAX)  0.4 MG CAPS capsule Take 0.4 mg by mouth daily.   tolterodine (DETROL LA) 4 MG 24 hr capsule Take 1 capsule (4 mg total) by mouth daily.   traMADol (ULTRAM) 50 MG tablet Take 1 tablet (50 mg total) by mouth every 12 (twelve) hours as needed for moderate pain or severe pain.   [DISCONTINUED] atorvastatin (LIPITOR) 80 MG tablet Take 1 tablet (80 mg total) by mouth daily. (Patient not taking: Reported on 08/03/2021)   [DISCONTINUED] clopidogrel (PLAVIX) 75 MG tablet Take 1 tablet (75 mg total) by mouth daily with breakfast.   [DISCONTINUED] meloxicam (MOBIC) 7.5 MG tablet TAKE 1 TABLET(7.5 MG) BY MOUTH DAILY   [DISCONTINUED] sertraline (ZOLOFT) 25 MG tablet Take 1 tablet (25 mg total) by mouth daily. (Patient taking differently: Take 25 mg by mouth daily. Pt is taking half a tab)   [DISCONTINUED] varenicline (CHANTIX) 1 MG tablet Take 1 tablet (1 mg total) by mouth 2 (two) times daily.   No facility-administered encounter medications on file as of 08/03/2021.    Surgical History: Past Surgical History:  Procedure Laterality Date   CARDIAC CATHETERIZATION     COLONOSCOPY WITH PROPOFOL N/A 04/07/2021   Procedure: COLONOSCOPY WITH PROPOFOL;  Surgeon: Virgel Manifold, MD;  Location: [facility] ENDOSCOPY;  Service: Endoscopy;  Laterality: N/A;   LEFT HEART CATH AND CORONARY ANGIOGRAPHY N/A 05/18/2021   Procedure: LEFT HEART CATH AND CORONARY ANGIOGRAPHY;  Surgeon: Wellington Hampshire,  MD;  Location: [place] CV LAB;  Service: Cardiovascular;  Laterality: N/A;    Medical History: Past Medical History:  Diagnosis Date   Arthritis    COPD (chronic obstructive pulmonary disease) ([place])    Depression    Emphysema of lung ([place])    Gout    left knee   Heart attack ([place])     Family History: Family History  Problem Relation Age of Onset   Alcohol abuse Mother    Alcohol abuse Father    Alcohol abuse Brother     Social History   Socioeconomic History   Marital status: Married    Spouse  name: Not on file   Number of children: Not on file   Years of education: Not on file   Highest education level: Not on file  Occupational History   Not on file  Tobacco Use   Smoking status: Every Day    Packs/day: 2.00    Years: 52.00    Pack years: 104.00    Types: Cigarettes   Smokeless tobacco: Never   Tobacco comments:    10 a day currently  Vaping Use   Vaping Use: Never used  Substance and Sexual Activity   Alcohol use: Not Currently   Drug use: Never   Sexual activity: Not on file  Other Topics Concern   Not on file  Social History Narrative   Not on file   Social Determinants of Health   Financial Resource Strain: Low Risk    Difficulty of Paying Living Expenses: Not hard at all  Food Insecurity: Not on file  Transportation Needs: Not on file  Physical Activity: Not on file  Stress: Not on file  Social Connections: Not on file  Intimate Partner Violence: Not on file      Review of Systems  Constitutional:  Negative for chills, fatigue and unexpected weight change.  HENT:  Negative for congestion, rhinorrhea, sneezing and sore throat.   Eyes:  Negative for redness.  Respiratory:  Negative for cough, chest tightness and shortness of breath.   Cardiovascular:  Negative for chest pain and palpitations.  Gastrointestinal:  Negative for abdominal pain, constipation, diarrhea, nausea and vomiting.  Genitourinary:  Negative for dysuria and frequency.  Musculoskeletal:  Negative for arthralgias, back pain, joint swelling and neck pain.  Skin:  Negative for rash.  Neurological: Negative.  Negative for tremors and numbness.  Hematological:  Negative for adenopathy. Does not bruise/bleed easily.  Psychiatric/Behavioral:  Positive for behavioral problems (Depression). Negative for self-injury, sleep disturbance and suicidal ideas. The patient is nervous/anxious.    Vital Signs: BP 106/73   Pulse 66   Temp 98 F (36.7 C)   Resp 16   Ht 6\' 1"  (1.854 m)   Wt 217  lb 6.4 oz (98.6 kg)   SpO2 98%   BMI 28.68 kg/m    Physical Exam Vitals reviewed.  Constitutional:      General: He is not in acute distress.    Appearance: Normal appearance. He is normal weight. He is not ill-appearing.  Eyes:     Extraocular Movements: Extraocular movements intact.     Pupils: Pupils are equal, round, and reactive to light.  Cardiovascular:     Rate and Rhythm: Normal rate and regular rhythm.  Pulmonary:     Effort: Pulmonary effort is normal. No respiratory distress.  Neurological:     Mental Status: He is alert and oriented to person, place, and time.     Cranial  Nerves: No cranial nerve deficit.     Coordination: Coordination normal.     Gait: Gait normal.  Psychiatric:        Mood and Affect: Affect normal. Mood is depressed.        Speech: Speech normal.        Behavior: Behavior normal. Behavior is cooperative.       Assessment/Plan: 1. Acute left flank pain Reports left flank pain has resolved. Renal ultrasound was normal except for a simple cyst in the left kidney.   2. Cigarette nicotine dependence with nicotine-induced disorder Continues to take chantix to help with smoking cessation. He is also on bupropion. Smoking less than 1 ppd.   3. Depression, major, single episode, mild (HCC) Discontinue sertraline, start duloxetine. Follow up in 4 weeks.  - DULoxetine (CYMBALTA) 30 MG capsule; Take 1 capsule (30 mg total) by mouth daily.  Dispense: 30 capsule; Refill: 1   General Counseling: K[place] verbalizes understanding of the findings of todays visit and agrees with plan of treatment. I have discussed any further diagnostic evaluation that may be needed or ordered today. We also reviewed his medications today. he has been encouraged to call the office with any questions or concerns that should arise related to todays visit.    No orders of the defined types were placed in this encounter.   Meds ordered this encounter  Medications    DULoxetine (CYMBALTA) 30 MG capsule    Sig: Take 1 capsule (30 mg total) by mouth daily.    Dispense:  30 capsule    Refill:  1    Please discontinue sertraline, patient is now switched to duloxetine.    Return in about 4 weeks (around 08/31/2021) for F/U, Anxiety/depression, eval new med, [place] PCP.   Total time spent:30 Minutes Time spent includes review of chart, medications, test results, and follow up plan with the patient.   [place] Controlled Substance Database was reviewed by me.  This patient was seen by Jonetta Osgood, FNP-C in collaboration with Dr. Clayborn Bigness as a part of collaborative care agreement.   Anayah Arvanitis R. Valetta Fuller, MSN, FNP-C Internal medicine

## 2021-08-05 ENCOUNTER — Other Ambulatory Visit: Payer: Self-pay | Admitting: Nurse Practitioner

## 2021-08-05 ENCOUNTER — Other Ambulatory Visit: Payer: Self-pay | Admitting: Internal Medicine

## 2021-08-05 DIAGNOSIS — F320 Major depressive disorder, single episode, mild: Secondary | ICD-10-CM

## 2021-08-05 DIAGNOSIS — F17219 Nicotine dependence, cigarettes, with unspecified nicotine-induced disorders: Secondary | ICD-10-CM

## 2021-08-05 MED ORDER — VARENICLINE TARTRATE 1 MG PO TABS: 1.0000 mg | ORAL_TABLET | Freq: Two times a day (BID) | ORAL | 0 refills | Status: DC

## 2021-08-05 NOTE — Telephone Encounter (Signed)
Med sent.

## 2021-08-06 ENCOUNTER — Other Ambulatory Visit: Payer: Self-pay

## 2021-08-06 NOTE — Telephone Encounter (Signed)
Please review its ok to send  

## 2021-08-07 ENCOUNTER — Telehealth: Payer: Self-pay | Admitting: Student-PharmD

## 2021-08-07 NOTE — Progress Notes (Addendum)
Chronic Care Management Pharmacy Assistant   Name: [name] [name]  MRN: [id number] DOB: 05/08/1956  [name] [name] is an 65 y.o. year old male who presents for his initial CCM visit with the clinical pharmacist.  Reason for Encounter: Chart Prep   Conditions to be addressed/monitored: CAD, Depression, HTN  Primary concerns for visit include: Medications  Recent office visits:  08/03/21 Jonetta Osgood, NP. For follow-up. STARTED Duloextine 30 mg daily. STOPPED Atorvastatin and Sertraline. (Chart open) 07/22/21 Jonetta Osgood, NP. For acute visit. STARTED PYRIDIUM 200 mg 3 times daily PRN. (Chart open) 07/06/21 Dr. Humphrey Rolls for COPD. No medication changes.  07/03/21 Jonetta Osgood, NP. For follow-up. No medication changes. 06/04/21 Jonetta Osgood, NP. For follow-up. STARTED Tramadol 50 mg every 12 hours PRN and RESTARTED CHANTIX PAK. 05/20/21 Jonetta Osgood, NP. For Hospitalization follow-up. STARTED Sertaline 25 mg daily. STOPPED bupropion 150 mg daily. CHANGED Metoprolol Tartrate to 12.5 mg daily.  03/30/21 Jonetta Osgood, NP. For follow-up. STARTED CHANTIX PAK. 03/16/21 Dr. Humphrey Rolls For new patient visit. STARTED Albuterol Sulfare 108 MCG/ACT 2 puffs inhalation eery 6 hours PRN and TRELEGY ELLIPTA 100-62.5-25 1 puff inhalation daily. STOPPED ADVAIR 100-50.   Recent consult visits:  07/17/21 Cardiology  Arvil Chaco, PA-C. For CAD. STOPPED Metoprolol Tartrate.  07/08/21 Odessa Fleming, MD. For history of colonic polyps. No medication changes.  06/15/21 Cardiology Mickle Plumb, jacquelyn D, PA-C. For hospitalization follow-up. INCREASED Chantix pak to 1 mg 2 times daily.  04/23/21 Urology Jasmine December For prostate cancer/prostatic hyperplasia. No more information given. 03/31/21 Orthopedic Surgery Carlynn Spry. No more information given. 03/24/21 Orthopedic Surgery Carlynn Spry. No more information given. 03/19/21 Odessa Fleming, MD.  For history of colonic polyps. Given: NULYTELY 420 g 4,000 Mls oral once.  03/17/21 Orthopedic Surgery Carlynn Spry. No more information given. 03/04/21 Oncology Boeders, Kirt Boys, NP. For follow-up. No medication changes.  02/17/21 Orthopedic Surgery Lovell Sheehan. No more information given.  Hospital visits:  05/17/21 [facility] (2 Days) Fritzi Mandes, MD. For NSTEMI (non-ST elevated myocardial infarction. STOPPED Buspirone, Ibuprofen, Ondansetron, Varenicline. Procedure: LEFT HEART CATH AND CORONARY ANGIOGRAPHY 04/07/21 [facility] (2 Hours) Virgel Manifold, MD For colonoscopy. No medication changes.   Medication History:(Star-rating) N/A  Medications: Outpatient Encounter Medications as of 08/07/2021  Medication Sig   albuterol (VENTOLIN HFA) 108 (90 Base) MCG/ACT inhaler Inhale 2 puffs into the lungs every 6 (six) hours as needed for wheezing or shortness of breath.   allopurinol (ZYLOPRIM) 100 MG tablet Take 2 tab po daily   aspirin EC 81 MG EC tablet Take 1 tablet (81 mg total) by mouth daily. Swallow whole.   buPROPion (WELLBUTRIN SR) 150 MG 12 hr tablet Take 150 mg by mouth 2 (two) times daily.   clopidogrel (PLAVIX) 75 MG tablet Take 1 tablet (75 mg total) by mouth daily with breakfast.   DULoxetine (CYMBALTA) 30 MG capsule Take 1 capsule (30 mg total) by mouth daily.   Fluticasone-Umeclidin-Vilant (TRELEGY ELLIPTA) 100-62.5-25 MCG/INH AEPB Inhale 1 puff into the lungs daily.   indomethacin (INDOCIN) 50 MG capsule Take 50 mg by mouth at bedtime.   meloxicam (MOBIC) 7.5 MG tablet TAKE 1 TABLET(7.5 MG) BY MOUTH DAILY   phenazopyridine (PYRIDIUM) 200 MG tablet Take 1 tablet (200 mg total) by mouth 3 (three) times daily as needed for pain.   sildenafil (VIAGRA) 100 MG tablet Take 1 tablet (100 mg total) by mouth daily as needed for erectile dysfunction.  tamsulosin (FLOMAX) 0.4 MG CAPS capsule Take 0.4 mg by mouth daily.   tolterodine  (DETROL LA) 4 MG 24 hr capsule Take 1 capsule (4 mg total) by mouth daily.   traMADol (ULTRAM) 50 MG tablet Take 1 tablet (50 mg total) by mouth every 12 (twelve) hours as needed for moderate pain or severe pain.   varenicline (CHANTIX) 1 MG tablet Take 1 tablet (1 mg total) by mouth 2 (two) times daily.   No facility-administered encounter medications on file as of 08/07/2021.    Have you seen any other providers since your last visit? Patient stated no.  Any changes in your medications or health? Patient stated no.  Any side effects from any medications? Patient stated no.  Do you have an symptoms or problems not managed by your medications? Patient stated no.  Any concerns about your health right now? Patient stated no.  Has your provider asked that you check blood pressure, blood sugar, or follow special diet at home? Patient stated no.  Do you get any type of exercise on a regular basis? Patient stated no.  Can you think of a goal you would like to reach for your health? Patient stated he would like to stop smoking, only smokes 6-7 cigarettes a day and he would like to get back in the gym.   Do you have any problems getting your medications? Patient stated no.  Is there anything that you would like to discuss during the appointment? Patient stated no.  Please bring medications and supplements to appointment, patient reminded of his face to face appointment on 08/10/21 at 61 am.  Follow-Up:Pharmacist Review  Charlann Lange, RMA Clinical Pharmacist Assistant [phone]  5 minutes spent in review, coordination, and documentation.  Reviewed by: Alena Bills, PharmD Clinical Pharmacist [phone]

## 2021-08-10 ENCOUNTER — Other Ambulatory Visit: Payer: Self-pay

## 2021-08-10 ENCOUNTER — Ambulatory Visit: Payer: Medicare Other | Admitting: Student-PharmD

## 2021-08-10 DIAGNOSIS — F17219 Nicotine dependence, cigarettes, with unspecified nicotine-induced disorders: Secondary | ICD-10-CM

## 2021-08-10 DIAGNOSIS — I214 Non-ST elevation (NSTEMI) myocardial infarction: Secondary | ICD-10-CM

## 2021-08-10 DIAGNOSIS — R350 Frequency of micturition: Secondary | ICD-10-CM

## 2021-08-10 DIAGNOSIS — N401 Enlarged prostate with lower urinary tract symptoms: Secondary | ICD-10-CM

## 2021-08-10 DIAGNOSIS — J449 Chronic obstructive pulmonary disease, unspecified: Secondary | ICD-10-CM

## 2021-08-10 DIAGNOSIS — F320 Major depressive disorder, single episode, mild: Secondary | ICD-10-CM

## 2021-08-10 NOTE — Progress Notes (Signed)
[name],[name]   48 years, Male  DOB: 07-12-1956  M: [phone]  Summary: Patient reports forgetting to take morning medications so counseled on adherence, suggested using an alarm. Patient interested in increasing bupropion for aid in smoking cessation and finding interest to get back into routine with mood. Will consult with PCP Counseled patient on importance of taking a statin drug post heart attack. Patient open to restarting a different statin. Will consult with PCP.  Patient scheduled for CCM visit with the clinical pharmacist.  Patient is referred for CCM by their PCP and CPP is under general PCP supervision.: At least 2 of these conditions are expected to last 12 months or longer and patient is at significant risk for acute exacerbations and/or functional decline.  Patient has consented to participation in [place] program. Visit Type: Clinic visit Date of Visit: 08/10/2021  Patient's Chronic Conditions: Benign Prostatic Hyperplasia (BPH), Chronic Obstructive Pulmonary Disease (COPD), Depression, Cardiovascular Disease (CVD)  Were there PCP Visits in last 6 months?: Yes Visit #1: 08/03/21 [name] Osgood, NP. For follow-up. STARTED Duloxetine 30 mg daily. STOPPED Atorvastatin and Sertraline. (Chart open) Visit #2: 07/22/21 [name] Osgood, NP. For acute visit. STARTED PYRIDIUM 200 mg 3 times daily PRN. (Chart open) Visit #3: 07/06/21 Dr. Humphrey [name] for COPD. No medication changes.  Visit #4: 07/03/21 [name] Osgood, NP. For follow-up. No medication changes. Visit #5: 06/04/21 [name] Osgood, NP. For follow-up. STARTED Tramadol 50 mg every 12 hours PRN and RESTARTED CHANTIX PAK. Visit #6: 05/20/21 [name] Osgood, NP. For Hospitalization follow-up. STARTED Sertaline 25 mg daily. STOPPED bupropion 150 mg daily. CHANGED Metoprolol Tartrate to 12.5 mg daily.  Additional Visits: 03/30/21 [name] Osgood, NP. For follow-up. STARTED CHANTIX PAK. 03/16/21 Dr. Humphrey [name] For new patient visit.  STARTED Albuterol Sulfare 108 MCG/ACT 2 puffs inhalation eery 6 hours PRN and TRELEGY ELLIPTA 100-62.5-25 1 puff inhalation daily. STOPPED ADVAIR 100-50.   Were there Specialist Visits in last 6 months?: Yes Visit #1: 07/17/21 Cardiology  [name] Mood D, PA-C. For CAD. STOPPED Metoprolol Tartrate Visit #2: 07/08/21 [name] Fleming, MD. For history of colonic polyps. No medication changes.  Visit #3: 06/15/21 Cardiology [name] Mood D, PA-C. For hospitalization follow-up. INCREASED Chantix pak to 1 mg 2 times daily.  Visit #4: 03/31/21 Orthopedic Surgery [name] [name]. No more information given. Visit #5: 03/24/21 Orthopedic Surgery [name] [name]. No more information given. Visit #6: 03/19/21 [name] Fleming, MD. For history of colonic polyps. Given: NULYTELY 420 g 4,000 Mls oral once.  Additional Visits: 03/17/21 Orthopedic Surgery [name] [name]. No more information given. 03/04/21 Oncology Boeders, Kirt Boys, NP. For follow-up. No medication changes.  02/17/21 Orthopedic Surgery [name] [name]. No more information given.  Was there a Hospital Visit in last 30 days?: Yes Reason for admission: NSTEMI Admit Date: 05/16/2021 Discharge Date: 05/18/2021 Location Discharged from: [facility] Medication Changes at hospital discharge 05/17/21 [facility] (2 Days) [name] Mandes, MD. For NSTEMI (non-ST elevated myocardial infarction. STOPPED Buspirone, Ibuprofen, Ondansetron, Varenicline. Procedure: LEFT HEART CATH AND CORONARY ANGIOGRAPHY  Were there other Hospital Visits in last 6 months?: Yes Visit #1: 04/07/21 [facility] (2 Hours) [name] Manifold, MD For colonoscopy. No medication changes.   Are there any Medication discrepancies?: No Are there any Medication adherence gaps (beyond 5 days past due)?: No Medication adherence rates for the STAR rating drugs: N/A List Patient's current Care Gaps: No current Care Gaps  identified  Have you seen any other providers since your last visit?:  No  Current BP: 106/73 Current HR: 66 taken on: 08/03/2021 Previous BP: 108/80 Previous HR: 70 taken on: 07/21/2021 Weight: 217 lbs BMI: 28.68 Last GFR: >60 taken on: 05/17/2021  Why did the patient present?: CCM  Retired? Previous work?: was a Quarry manager for [employer], wife is a retired Optician, dispensing does the patient do during the day?: Currently, patient sleeps most of the day. Would really like to get back into going to the gym and into his routine pre-heart attack. Who does the patient spend their time with and what do they do?: has two great danes and a chihuahua, lets them out into the back yard Lifestyle habits such as diet and exercise?: Diet: does not usually eat breakfast, generally eats cold cuts or a sandwich at home for lunch, snacks on apple pies and cookies, goes out to eat for dinner. Mainly drinks juice and little to no water Exercise: none currently, wants to get back into the gym Alcohol, tobacco, and illicit drug usage?: Reports smoking 6-7 cigarettes a day What is the patient's sleep pattern? sleeping more than they like and feels fatigued, wakes up a couple times a night to go to bathroom How many hours per night does patient typically sleep?: 6 hours, wakes up to let dogs out, sleeps for another 4 hours Patient pleased with health care they are receiving?: Yes Family, occupational, and living circumstances relevant to overall health?: post-heart attack, really wanting to get back into a routine. His wife is a Marine scientist who helps him manage his medications Factors that may affect medication adherence?: Pill burden, Perceived lack of benefit of therapy Name and location of Current pharmacy: [place] Current Rx insurance plan: UHC Are meds synced by current pharmacy?: Yes Are meds delivered by current pharmacy?: No - delivery available but patient prefers to not use Would patient benefit  from direct intervention of clinical lead in dispensing process to optimize clinical outcomes?: No Are [facility] pharmacy services available where patient lives?: Yes Is patient disadvantaged to use [facility]?: Yes Does patient experience delays in picking up medications due to transportation concerns (getting to pharmacy)?: No Any additional demeanor/mood notes?: very pleasant and good knowledge base of meds, open to starting therapies that will benefit his health  Chronic Obstructive Pulmonary Disease (COPD)  Current FEV1/FVC: 62% Current FEV1: 2.25 L taken on: 02/21/2021 Current Eosinophils: 0.2 taken on: 10/31/2020 Gold grade: 2 (FEV1 50-79%) Gold group: A (low sx, < 2 exacerbations / yr) Exacerbations in past year without hospitalization: No Is patient currently Smoking or Vaping?: Yes Home oxygen therapy: No Frequency of SABA/SAMA use: Never Influenza vaccine: Yes Prevnar vaccine: Yes Pneumovax vaccine: Yes We counseled the patient on:: Smoking cessation, Treatment goals (documented in Care Plan), Inhaler technique Assessment:: Uncontrolled Drug: Trelegy 100-62.5-25 (1 puff daily) Assessment: Appropriate, Effective, Safe, Accessible Drug: Ventolin HFA - 2 puffs every 6 hours as needed Assessment: Appropriate, Effective, Safe, Accessible Additional Info: patient reports using maintenance inhaler on as needed basis. and does not use ventolin at all Plan to Counsel: Educated patient on difference between maintenance and rescue inhaler Plan to (other): Start using trelegy as prescribed HC Follow up: 2 month phone call for general assessment Pharmacist Follow up: 3 month phone call on 2/15  Depression Completing the PHQ-9 Questionnaire today?: No In your opinion, how do you feel your depression symptoms have been controlled over the past 3 months?: Stable / stayed the same Patient has tried and failed: Buspirone, Sertraline Assessment::  Controlled Drug: Bupropion 150mg  XL  once daily Assessment: Appropriate, Effective, Safe, Accessible Drug: Cymbalta 30mg  daily Assessment: Appropriate, Effective, Safe, Accessible Additional Info: Has only been taking bupropion SR once daily, instead of prescribed twice daily (did not realize it was twice daily) Plan to (other): Plan to begin taking bupropion twice daily as prescribed HC Follow up: 2 month general call Pharmacist Follow up: 3 month f/u call  BPH Current PSA: 1.72 taken on: 10/31/2020 Are you experiencing any side effects from your BPH medication?: None Completing BPH AUA Questionnaire today?: No How would you feel if you had to live with your urinary condition the way it is now, no better, no worse, for the rest of your life?: Mixed Patient has tried and failed: N/A We discussed: Limiting caffeine intake, Limit fluid intake for 2-3 hours before bedtime Assessment:: Uncontrolled Drug: Tamsulosin 0.4mg  daily Assessment: Appropriate, Effective, Safe, Accessible Drug: Tolterodine 4mg  daily Assessment: Appropriate, Effective, Safe, Accessible Additional Info: patient reports still getting up to go to bathroom in middle of night may benefit from addition of oxybutynin or other alternative to tolterodine Plan to Counsel: Limit fluid intake 2 hours prior to bedtime HC Follow up: 2 month general assessment call Pharmacist Follow up: 3 month f/u call  Cardiovascular Disease (CVD) Is Patient taking statin medication?: No Reason patient is not taking statin(s): atorvastatin caused muscle cramps Is patient currently Smoking or Vaping?: Yes See Tobacco Use Disorder section for details: Done Is patient currently on antiplatelet therapy?: Yes Is patient experiencing any abnormal bruising or bleeding?: No Patient has the following: Coronary Artery Disease (CAD) CAD: Prior events / interventions (include dates if available): NSTEMI 05/17/21 Does patient have an Rx for nitroglycerin?: No Has patient experienced any  chest pain?: No Patient has tried and failed: Atorvastatin 80mg  We discussed: Warning signs of MI, Smoking cessation, Other Other: benefit of trying another statin to reduce risk of MI Assessment:: Controlled Drug: Clopidogrel 75mg  daily Assessment: Appropriate, Effective, Safe, Accessible Drug: Aspirin 81mg  EC daily Assessment: Appropriate, Effective, Safe, Accessible Plan to (other): Consult with PCP about a trial of rosuvastatin and potential of nitroglycerin script HC Follow up: 2 month general Pharmacist Follow up: 3 month f/u call  Tobacco Use Disorder Is patient currently Smoking or Vaping?: Yes How long has patient been smoking/vaping?: years How much does patient smoke/vape per day?: 6-7 cigarettes a day How soon after you wake up do you smoke your first cigarette?: Within 30 minutes What health problems has your smoking contributed to?: MI in august Have you ever tried to quit smoking?: Yes When did you last try to quit smoking?: Currently trying to quit smoking Why did you try to quit smoking?: Improve overall health, Cost of smoking, Maintain fitness How do you feel about quitting smoking now or in the near future?: Working on quitting What changes have you made to help with quitting smoking?: taking varenicline and bupropion Patient has tried and failed: N/A Assessment:: Uncontrolled Drug: Bupropion 150mg  XL once daily Assessment: Appropriate, Effective, Safe, Accessible Drug: Varenicline 1 mg twice daily Assessment: Appropriate, Effective, Safe, Accessible Additional Info: Was only taking bupropion once daily, will begin taking twice daily as prescribed Plan to (other): Continue medication therapy as prescribed HC Follow up: 2 month general assessment Pharmacist Follow up: 3 month f/u call  Exercise, Diet and Non-Drug Coordination Needs Additional exercise counseling points. We discussed: targeting at least 150 minutes per week of moderate-intensity aerobic  exercise., decreasing sedentary behavior Additional diet counseling points. We discussed:  key components of the DASH diet, aiming to consume at least 8 cups of water day Discussed Non-Drug Care Coordination Needs: Yes Does Patient have Medication financial barriers?: No  [facility] Health-Related Social Needs Screening Tool -  SDOH   No issues to report today  [place] Pharmacist [phone]  COMPREHENSIVE CARE PLAN AND GOALS:    Post-Heart attack THERAPY   CURRENT REGIMEN AND DOSING:  Aspirin 81 mg - take one tablet once daily  Clopidogrel 75mg  - take one tablet once daily  THE GOALS WE HAVE CHOSEN ARE:  Monitor for symptoms of bleeding.     BARRIERS TO ACHIEVING GOALS: Medication adherence   PLAN TO WORK ON THESE GOALS:  Avoid over the counter medications that would increase risk of bleeding, such as NSAIDS (ibuprofen, naproxen, Goody's powder, others).      Depression  CURRENT MEDICATION AND DOSING:  Bupropion XL 150mg  - take once daily   Cymbalta 30mg  - take once daily  THE GOALS WE HAVE CHOSEN ARE: Continue to see improvement in depression symptoms.  BARRIERS TO ACHIEVING GOALS: Medication adherence   PLAN TO WORK ON THESE GOALS:  Continue current medications as prescribed.     Tobacco Use  CURRENT MEDICATION AND DOSING:  Bupropion XL 150mg  -take once daily   Varenicline 1mg  - take twice daily  THE GOALS WE HAVE CHOSEN ARE: Stop smoking  BARRIERS TO ACHIEVING GOALS: Medication adherence   PLAN TO WORK ON THESE GOALS:  Set an alarm to remember morning dose of meds  COPD  LAST SPIROMETRY SCORE/DATE:  62%                                                        CURRENT CLASSIFICATION AS:  COPD Grade 2   CURRENT MEDICATION AND DOSING:  Trelegy 100 - 62.5 - 25:  1 puff daily Ventolin - 2 puffs every 6 hours as needed  THE GOALS WE HAVE CHOSEN ARE: Prevent worsening of shortness of breath and hospitalizations.  BARRIERS TO ACHIEVING  GOALS: Medication adherence  PLAN TO WORK ON THESE GOALS: Begin to use maintenance inhaler as prescribed   BENIGN PROSTATIC HYPERPLASIA (BPH)   CURRENT MEDICATION AND DOSING:  Tamsulosin 0.4mg  - take once daily  Tolterodine 4mg  - once daily  THE GOALS WE HAVE CHOSEN ARE:  Reduce urinary symptoms    BARRIERS TO ACHIEVING GOALS: Medication adherence  PLAN TO WORK ON THESE GOALS:  Adhere to medication; consult urologist if not working     HEALTHY HABITS (Diet and exercise)  CURRENT DIET/EXERCISE: No exercise currently  THE GOALS WE HAVE CHOSEN ARE:   Stay active, engage in at least 150 minutes per week of moderate-intensity exercise such as brisk walking (15- to 20-minute mile) or something similar. Increase flexibility exercises, break up prolonged periods of sitting Increase seafood, lean meats, whole grains, legumes, nuts, fruits (for dessert), and vegetables. Limit salt intake, sugars, carbs, and red meat, refined and processed foods.  BARRIERS TO ACHIEVING GOALS:  Feeling fatigued  PLAN TO WORK ON THESE GOALS:  Get back into a routine and ease back into going to the gym   ACTIVE MEDICATION LIST  MEDICATION DOSE DIRECTIONS CONDITION NOTES  Ventolin HFA  2 puffs as needed every 6 hours COPD   Allopurinol 100mg  2 tabs daily GOUT  Aspirin  EC 81mg  1 tab daily CAD   Bupropion SR 150mg  1 tab twice daily MOOD/Smoking cessation   Duloxetine 30mg   1 cap daily MOOD   Clopidogrel 75mg   1 tab daily CAD   Trelegy 100-62.5-25 1 puff daily COPD   Meloxicam  7.5mg  1 tab daily PAIN/GOUT   Varenicline 1mg  1 tab twice daily Smoking Cessation   Tolterodine 4mg  1 cap daily BPH   Tamsulosin  0.4mg  1 cap daily BPH    MEDICATION REVIEW  MEDICATION REVIEW CONDUCTED:   Yes DATE:  08/10/2021  SOURCE:   Medical Records  PHARMACY  VIALS OR PACKS: Vials  [facility]       ALLERGIES/INTOLERANCES   NAME OF MEDICATION  REACTION    No known drug allergies       IMMUNIZATIONS   VACCINE NAME  DATE GIVEN    No vaccine history per chart     CURRENT HEALTHCARE PROVIDER TEAM   PROVIDER/TEAM MEMBER  ROLE  PHONE NUMBER  COMMENTS    [name] [name] Primary Care Provider  [phone]    [place], [place] Pharmacist [phone]              F/U in 3 months  [place] Pharmacist [phone]  Exercise Guidelines During Cardiac Rehabilitation When you are recovering from a heart condition or surgery, it is important to have heart-healthy habits, including exercise routines. Discuss an appropriate exercise program with your heart specialist (cardiologist) and rehabilitation therapist. The program should meet your specific abilities and needs. Walking, biking, jogging, and swimming are all good aerobic activities and take light to moderate effort. Aerobic activities cause your heart to beat faster. Adding some light resistance training is also good for you. Even simple lifestyle changes can help. These lifestyle changes may include parking farther from the store or taking the stairs instead of the elevator. At first, you may begin exercising under supervision, such as at a hospital or clinic. Over time, you may begin exercising at home if your health care provider approves. Types of exercise Below are types of exercises that are an important part of cardiac rehabilitation. Follow your health care provider's instructions on what types of exercises are good for you and your heart. Aerobic exercise Aerobic exercise keeps joints and muscles moving and is important to keep your heart healthy. It involves large muscle groups and improves blood flow (circulation) and endurance. It is also rhythmic and must be done for a longer period of time. Examples of aerobic exercise include: Swimming. Walking. Hiking. Jogging. Cross-country skiing. Dancing. Biking.   Static exercise Static exercise (isometric exercise) uses muscles at high intensities without moving the joints. Some  examples of static exercise include pushing against a heavy couch that does not move, doing a wall sit, or holding a plank position. Static exercise improves strength but also quickly increases blood pressure. Follow these guidelines: If you have circulation problems or high blood pressure, talk with your health care provider before starting any static exercise routines. Do not do static exercises if your health care provider tells you not to. Do not hold your breath while doing static exercises. Holding your breath during static exercises can raise your blood pressure to a dangerously high level.   Weight-resistance exercise Weight-resistance exercises are another important part of rehabilitation. These exercises strengthen your muscles by making them work against resistance. Resistance exercises may help you return to activities of daily living sooner and improve your quality of life. They also help reduce cardiac  risk factors. Examples of weight-resistance exercise include using: Free weights. Weight-lifting machines. Large, specially designed rubber bands. You will usually do weight-resistance exercises 2 times a week, with a 2-day rest period between workouts. Stretching Stretching before you exercise warms up your muscles and prevents injury. Stretching also improves your flexibility, balance, coordination, and range of motion. Follow these guidelines: Stretch before and after exercising. Do not force a muscle or joint into a painful angle. Stretching should be a relaxing part of your exercise routine. When you feel resistance in your muscle, hold the stretch for a few seconds. Make sure you keep breathing while you hold the stretch. Go slowly when doing all stretches. Setting a pace Choose a pace that is comfortable for you. You should be able to talk while exercising. If you are short of breath or unable to speak while you exercise, slow down. If you can sing while exercising, you are not  exercising hard enough. Keep track of how hard you are working as you exercise (exertion level). Your rehabilitation therapist can teach you to use a mental scale to measure your level of exertion (perceived exertion). Using a mental scale, you will think about your exertion level and rate it in a range from 6 to 20. A rating of 6 to 10. This means that you are doing very light exercise and are not exerting yourself enough. For a healthy person, this may be walking at a slow pace. A rating of 11 to 15. This is exercise that is somewhat hard. For a healthy exercise session, you should aim for an exertion rate that is within this range. A rating of 16 to 18. This is considered very hard or strenuous. For a healthy person, exercise at this rating may start to feel heavy and difficult. A rating of 19 or 20. This means that you are working extremely hard. For most people, these numbers represent the hardest you have ever worked to exercise. Your health care provider or cardiac rehabilitation specialist may also recommend that you wear a heart rate monitor while you exercise. This will help keep track of your heart rate zones and how hard your heart is working. Frequency As you are recovering, it is important to start exercising slowly and to gradually work up to your goal. Work with your health care provider to set up an exercise routine that works for you. Generally, cardiac rehabilitation exercise should include: 40 minutes of aerobic activity 3-4 days a week. Stretching and strength exercises 2-3 days a week. Contact a health care provider if: You have any of the following symptoms while exercising: Pain, pressure, or burning in your chest, jaw, shoulder, or back (angina). Feeling light-headed or dizzy. Irregular or fast heartbeats (palpitations). Shortness of breath. You are extremely tired after exercising. Get help right away if you: Have angina that lasts for longer than 5 minutes and medicine  does not help. Have severe chest discomfort, especially if the pain is crushing or pressure-like and spreads to your arms, back, neck, or jaw. Do not wait to see if the pain will go away. Have weakness or numbness in one or both legs. Are confused. Have trouble breathing or shortness of breath. Have excessive sweating that is not caused by exercise. Have any symptoms of a stroke. "BE FAST" is an easy way to remember the main warning signs of a stroke: B - Balance. Signs are dizziness, sudden trouble walking, or loss of balance. E - Eyes. Signs are trouble seeing or a  sudden change in vision. F - Face. Signs are sudden weakness or numbness of the face, or the face or eyelid drooping on one side. A - Arms. Signs are weakness or numbness in an arm. This happens suddenly and usually on one side of the body. S - Speech. Signs are sudden trouble speaking, slurred speech, or trouble understanding what people say. T - Time. Time to call emergency services. Write down what time symptoms started. Have other signs of a stroke, such as: A sudden, severe headache with no known cause. Nausea or vomiting. Seizure. These symptoms may represent a serious problem that is an emergency. Do not wait to see if the symptoms will go away. Get medical help right away. Call your local emergency services (911 in the [place] Do not drive yourself to the hospital. Summary When you are recovering from a heart condition, it is important to have heart-healthy habits, including exercise routines. At first, you may begin exercising under supervision, such as at a hospital or clinic. Over time, you may begin exercising at home if your health care provider approves. Choose a pace that is comfortable for you. You should be able to talk while exercising. Aim for 40 minutes of aerobic exercises 3-4 days a week. Aim to do stretching and strength exercises 2-3 days a week. This information is not intended to replace advice given to  you by your health care provider. Make sure you discuss any questions you have with your health care provider. Document Revised: 05/30/2019 Document Reviewed: 05/30/2019 Elsevier Patient Education  [place].

## 2021-08-12 ENCOUNTER — Telehealth: Payer: Self-pay | Admitting: Student-PharmD

## 2021-08-12 NOTE — Progress Notes (Signed)
  Chronic Care Management Pharmacy Assistant   Name: [name] [name]  MRN: [id number] DOB: 06-29-1956   Reason for Encounter: CCM Care Plan   Medications: Outpatient Encounter Medications as of 08/12/2021  Medication Sig   albuterol (VENTOLIN HFA) 108 (90 Base) MCG/ACT inhaler Inhale 2 puffs into the lungs every 6 (six) hours as needed for wheezing or shortness of breath.   allopurinol (ZYLOPRIM) 100 MG tablet Take 2 tab po daily   aspirin EC 81 MG EC tablet Take 1 tablet (81 mg total) by mouth daily. Swallow whole.   buPROPion (WELLBUTRIN SR) 150 MG 12 hr tablet Take 150 mg by mouth 2 (two) times daily.   clopidogrel (PLAVIX) 75 MG tablet Take 1 tablet (75 mg total) by mouth daily with breakfast.   DULoxetine (CYMBALTA) 30 MG capsule Take 1 capsule (30 mg total) by mouth daily.   Fluticasone-Umeclidin-Vilant (TRELEGY ELLIPTA) 100-62.5-25 MCG/INH AEPB Inhale 1 puff into the lungs daily.   indomethacin (INDOCIN) 50 MG capsule Take 50 mg by mouth at bedtime. (Patient not taking: Reported on 08/10/2021)   meloxicam (MOBIC) 7.5 MG tablet TAKE 1 TABLET(7.5 MG) BY MOUTH DAILY   phenazopyridine (PYRIDIUM) 200 MG tablet Take 1 tablet (200 mg total) by mouth 3 (three) times daily as needed for pain.   sildenafil (VIAGRA) 100 MG tablet Take 1 tablet (100 mg total) by mouth daily as needed for erectile dysfunction.   tamsulosin (FLOMAX) 0.4 MG CAPS capsule Take 0.4 mg by mouth daily.   tolterodine (DETROL LA) 4 MG 24 hr capsule Take 1 capsule (4 mg total) by mouth daily.   traMADol (ULTRAM) 50 MG tablet Take 1 tablet (50 mg total) by mouth every 12 (twelve) hours as needed for moderate pain or severe pain.   varenicline (CHANTIX) 1 MG tablet Take 1 tablet (1 mg total) by mouth 2 (two) times daily.   No facility-administered encounter medications on file as of 08/12/2021.    Reviewed the patients initial visit reinsured it was completed per the pharmacist Alena Bills request. Printed the CCM  care plan. Mailed the patient CCM care plan to their most recent address on file.   Follow-Up:Pharmacist Review  Charlann Lange, [place] Pharmacist Assistant [phone]

## 2021-08-14 ENCOUNTER — Encounter: Payer: Self-pay | Admitting: Nurse Practitioner

## 2021-08-18 ENCOUNTER — Other Ambulatory Visit: Payer: Self-pay | Admitting: Nurse Practitioner

## 2021-08-19 DIAGNOSIS — I2510 Atherosclerotic heart disease of native coronary artery without angina pectoris: Secondary | ICD-10-CM

## 2021-08-19 DIAGNOSIS — J449 Chronic obstructive pulmonary disease, unspecified: Secondary | ICD-10-CM

## 2021-08-21 ENCOUNTER — Ambulatory Visit: Payer: Medicare Other

## 2021-08-26 ENCOUNTER — Other Ambulatory Visit: Payer: Self-pay

## 2021-08-26 ENCOUNTER — Encounter: Payer: Self-pay | Admitting: Nurse Practitioner

## 2021-08-26 MED ORDER — CLOPIDOGREL BISULFATE 75 MG PO TABS: 75.0000 mg | ORAL_TABLET | Freq: Every day | ORAL | 2 refills | Status: DC

## 2021-08-29 ENCOUNTER — Encounter: Payer: Self-pay | Admitting: Nurse Practitioner

## 2021-09-01 ENCOUNTER — Other Ambulatory Visit: Payer: Self-pay | Admitting: Nurse Practitioner

## 2021-09-01 DIAGNOSIS — F17219 Nicotine dependence, cigarettes, with unspecified nicotine-induced disorders: Secondary | ICD-10-CM

## 2021-09-02 ENCOUNTER — Telehealth: Payer: Self-pay

## 2021-09-02 NOTE — Telephone Encounter (Signed)
Med sent.

## 2021-09-02 NOTE — Telephone Encounter (Signed)
Left vm to confirm 09/04/21 appointment-Toni

## 2021-09-03 ENCOUNTER — Ambulatory Visit: Payer: Medicare Other | Admitting: Nurse Practitioner

## 2021-09-04 ENCOUNTER — Encounter: Payer: Self-pay | Admitting: Nurse Practitioner

## 2021-09-04 ENCOUNTER — Ambulatory Visit (INDEPENDENT_AMBULATORY_CARE_PROVIDER_SITE_OTHER): Payer: Medicare Other | Admitting: Nurse Practitioner

## 2021-09-04 ENCOUNTER — Other Ambulatory Visit: Payer: Self-pay

## 2021-09-04 VITALS — BP 107/71 | HR 76 | Temp 98.2°F | Resp 16 | Ht 73.0 in | Wt 216.4 lb

## 2021-09-04 DIAGNOSIS — Z8739 Personal history of other diseases of the musculoskeletal system and connective tissue: Secondary | ICD-10-CM | POA: Diagnosis not present

## 2021-09-04 DIAGNOSIS — N401 Enlarged prostate with lower urinary tract symptoms: Secondary | ICD-10-CM

## 2021-09-04 DIAGNOSIS — I700 Atherosclerosis of aorta: Secondary | ICD-10-CM | POA: Diagnosis not present

## 2021-09-04 DIAGNOSIS — F17219 Nicotine dependence, cigarettes, with unspecified nicotine-induced disorders: Secondary | ICD-10-CM

## 2021-09-04 DIAGNOSIS — R350 Frequency of micturition: Secondary | ICD-10-CM | POA: Diagnosis not present

## 2021-09-04 DIAGNOSIS — Z0001 Encounter for general adult medical examination with abnormal findings: Secondary | ICD-10-CM

## 2021-09-04 DIAGNOSIS — M25562 Pain in left knee: Secondary | ICD-10-CM

## 2021-09-04 DIAGNOSIS — J432 Centrilobular emphysema: Secondary | ICD-10-CM | POA: Diagnosis not present

## 2021-09-04 DIAGNOSIS — R300 Dysuria: Secondary | ICD-10-CM | POA: Diagnosis not present

## 2021-09-04 DIAGNOSIS — G8929 Other chronic pain: Secondary | ICD-10-CM | POA: Diagnosis not present

## 2021-09-04 DIAGNOSIS — F320 Major depressive disorder, single episode, mild: Secondary | ICD-10-CM

## 2021-09-04 MED ORDER — TAMSULOSIN HCL 0.4 MG PO CAPS
0.4000 mg | ORAL_CAPSULE | Freq: Every day | ORAL | 1 refills | Status: DC
Start: 2021-09-04 — End: 2022-09-09

## 2021-09-04 MED ORDER — TRAMADOL HCL 50 MG PO TABS: 50.0000 mg | ORAL_TABLET | Freq: Two times a day (BID) | ORAL | 0 refills | Status: DC | PRN

## 2021-09-04 MED ORDER — ALLOPURINOL 100 MG PO TABS
ORAL_TABLET | ORAL | 3 refills | Status: DC
Start: 2021-09-04 — End: 2022-03-02

## 2021-09-04 MED ORDER — BUPROPION HCL ER (SR) 150 MG PO TB12: 150.0000 mg | ORAL_TABLET | Freq: Two times a day (BID) | ORAL | 1 refills | Status: DC

## 2021-09-04 NOTE — Progress Notes (Signed)
[facility] [place], [place] [address]  Internal MEDICINE  Office Visit Note  Patient Name: [name] [name]  [id number]  [id number]  Date of Service: 09/04/2021  Chief Complaint  Patient presents with   Medicare Wellness   Depression   COPD    HPI [name] [name] presents for an annual well visit and physical exam. He is a well appearing 65 yo male. He received a covid booster dose this week. He has had recent labs. His lipid panel was normal except for a low HDL level. He is due for a routine colonoscopy in 2027. A1c is 6.1 which is prediabetic. He will be getting knee replacement surgery in January.  His blood pressure is wnl. He was hospitalized for a myocardial infarction earlier this year and around the same time his cat passed away. He has been dealing with depression and has started on medication for this which is helping and he is taking chantix to help him quit smoking and he is down to less than 1/2 a pack per day.  He takes allopurinol for gout. His COPD is centrilobular emphysema and takes trelegy ellipta which is helpful.    Current Medication: Outpatient Encounter Medications as of 09/04/2021  Medication Sig   albuterol (VENTOLIN HFA) 108 (90 Base) MCG/ACT inhaler Inhale 2 puffs into the lungs every 6 (six) hours as needed for wheezing or shortness of breath.   aspirin EC 81 MG EC tablet Take 1 tablet (81 mg total) by mouth daily. Swallow whole.   clopidogrel (PLAVIX) 75 MG tablet Take 1 tablet (75 mg total) by mouth daily with breakfast.   DULoxetine (CYMBALTA) 30 MG capsule Take 1 capsule (30 mg total) by mouth daily.   Fluticasone-Umeclidin-Vilant (TRELEGY ELLIPTA) 100-62.5-25 MCG/INH AEPB Inhale 1 puff into the lungs daily.   meloxicam (MOBIC) 7.5 MG tablet TAKE 1 TABLET(7.5 MG) BY MOUTH DAILY   phenazopyridine (PYRIDIUM) 200 MG tablet Take 1 tablet (200 mg total) by mouth 3 (three) times daily as needed for pain.   sildenafil (VIAGRA) 100 MG tablet  Take 1 tablet (100 mg total) by mouth daily as needed for erectile dysfunction.   tolterodine (DETROL LA) 4 MG 24 hr capsule Take 1 capsule (4 mg total) by mouth daily.   varenicline (CHANTIX) 1 MG tablet TAKE 1 TABLET(1 MG) BY MOUTH TWICE DAILY   [DISCONTINUED] allopurinol (ZYLOPRIM) 100 MG tablet Take 2 tab po daily   [DISCONTINUED] buPROPion (WELLBUTRIN SR) 150 MG 12 hr tablet Take 150 mg by mouth 2 (two) times daily.   [DISCONTINUED] tamsulosin (FLOMAX) 0.4 MG CAPS capsule Take 0.4 mg by mouth daily.   [DISCONTINUED] traMADol (ULTRAM) 50 MG tablet Take 1 tablet (50 mg total) by mouth every 12 (twelve) hours as needed for moderate pain or severe pain.   allopurinol (ZYLOPRIM) 100 MG tablet Take 2 tab po daily   buPROPion (WELLBUTRIN SR) 150 MG 12 hr tablet Take 1 tablet (150 mg total) by mouth 2 (two) times daily.   tamsulosin (FLOMAX) 0.4 MG CAPS capsule Take 1 capsule (0.4 mg total) by mouth daily.   traMADol (ULTRAM) 50 MG tablet Take 1 tablet (50 mg total) by mouth every 12 (twelve) hours as needed for moderate pain or severe pain.   [DISCONTINUED] indomethacin (INDOCIN) 50 MG capsule Take 50 mg by mouth at bedtime. (Patient not taking: Reported on 08/10/2021)   No facility-administered encounter medications on file as of 09/04/2021.    Surgical History: Past Surgical History:  Procedure Laterality Date  CARDIAC CATHETERIZATION     COLONOSCOPY WITH PROPOFOL N/A 04/07/2021   Procedure: COLONOSCOPY WITH PROPOFOL;  Surgeon: Virgel Manifold, MD;  Location: [facility] ENDOSCOPY;  Service: Endoscopy;  Laterality: N/A;   LEFT HEART CATH AND CORONARY ANGIOGRAPHY N/A 05/18/2021   Procedure: LEFT HEART CATH AND CORONARY ANGIOGRAPHY;  Surgeon: Wellington Hampshire, MD;  Location: [place] [facility];  Service: Cardiovascular;  Laterality: N/A;    Medical History: Past Medical History:  Diagnosis Date   Arthritis    COPD (chronic obstructive pulmonary disease) ([place])    Depression     Emphysema of lung ([place])    Gout    left knee   Heart attack ([place])     Family History: Family History  Problem Relation Age of Onset   Alcohol abuse Mother    Alcohol abuse Father    Alcohol abuse Brother     Social History   Socioeconomic History   Marital status: Married    Spouse name: Not on file   Number of children: Not on file   Years of education: Not on file   Highest education level: Not on file  Occupational History   Not on file  Tobacco Use   Smoking status: Every Day    Packs/day: 2.00    Years: 52.00    Pack years: 104.00    Types: Cigarettes   Smokeless tobacco: Never   Tobacco comments:    10 a day currently  Vaping Use   Vaping Use: Never used  Substance and Sexual Activity   Alcohol use: Not Currently   Drug use: Never   Sexual activity: Not on file  Other Topics Concern   Not on file  Social History Narrative   Not on file   Social Determinants of Health   Financial Resource Strain: Low Risk    Difficulty of Paying Living Expenses: Not hard at all  Food Insecurity: Not on file  Transportation Needs: Not on file  Physical Activity: Not on file  Stress: Not on file  Social Connections: Not on file  Intimate Partner Violence: Not on file      Review of Systems  Constitutional:  Negative for activity change, appetite change, chills, fatigue, fever and unexpected weight change.  HENT: Negative.  Negative for congestion, ear pain, rhinorrhea, sore throat and trouble swallowing.   Eyes: Negative.   Respiratory: Negative.  Negative for cough, chest tightness, shortness of breath and wheezing.   Cardiovascular: Negative.  Negative for chest pain.  Gastrointestinal: Negative.  Negative for abdominal pain, blood in stool, constipation, diarrhea, nausea and vomiting.  Endocrine: Negative.   Genitourinary: Negative.  Negative for difficulty urinating, dysuria, frequency, hematuria and urgency.  Musculoskeletal:  Positive for arthralgias.  Negative for back pain, joint swelling, myalgias and neck pain.       Left knee pain  Skin: Negative.  Negative for rash and wound.  Allergic/Immunologic: Negative.  Negative for immunocompromised state.  Neurological: Negative.  Negative for dizziness, seizures, numbness and headaches.  Hematological: Negative.   Psychiatric/Behavioral:  Positive for depression. Negative for behavioral problems, self-injury and suicidal ideas. The patient is not nervous/anxious.    Vital Signs: BP 107/71    Pulse 76    Temp 98.2 F (36.8 C)    Resp 16    Ht 6\' 1"  (1.854 m)    Wt 216 lb 6.4 oz (98.2 kg)    SpO2 98%    BMI 28.55 kg/m    Physical Exam Vitals reviewed.  Constitutional:      General: He is awake. He is not in acute distress.    Appearance: Normal appearance. He is well-developed, well-groomed and overweight. He is not ill-appearing or diaphoretic.  HENT:     Head: Normocephalic and atraumatic.     Right Ear: Tympanic membrane, ear canal and external ear normal.     Left Ear: Tympanic membrane, ear canal and external ear normal.     Nose: Nose normal. No congestion or rhinorrhea.     Mouth/Throat:     Lips: Pink.     Mouth: Mucous membranes are moist.     Pharynx: Oropharynx is clear. Uvula midline. No oropharyngeal exudate or posterior oropharyngeal erythema.  Eyes:     General: Lids are normal. Vision grossly intact. Gaze aligned appropriately. No scleral icterus.       Right eye: No discharge.        Left eye: No discharge.     Extraocular Movements: Extraocular movements intact.     Conjunctiva/sclera: Conjunctivae normal.     Pupils: Pupils are equal, round, and reactive to light.     Funduscopic exam:    Right eye: Red reflex present.        Left eye: Red reflex present. Neck:     Thyroid: No thyromegaly.     Vascular: No carotid bruit or JVD.     Trachea: Trachea and phonation normal. No tracheal deviation.  Cardiovascular:     Rate and Rhythm: Normal rate and regular  rhythm.     Pulses: Normal pulses.     Heart sounds: Normal heart sounds, S1 normal and S2 normal. No murmur heard.   No friction rub. No gallop.  Pulmonary:     Effort: Pulmonary effort is normal. No accessory muscle usage or respiratory distress.     Breath sounds: Normal breath sounds and air entry. No stridor. No wheezing or rales.  Chest:     Chest wall: No tenderness.  Abdominal:     General: Bowel sounds are normal. There is no distension.     Palpations: Abdomen is soft. There is no shifting dullness, fluid wave, mass or pulsatile mass.     Tenderness: There is no abdominal tenderness. There is no guarding or rebound.  Musculoskeletal:        General: No tenderness or deformity. Normal range of motion.     Cervical back: Normal range of motion and neck supple.     Right lower leg: No edema.     Left lower leg: No edema.  Lymphadenopathy:     Cervical: No cervical adenopathy.  Skin:    General: Skin is warm and dry.     Capillary Refill: Capillary refill takes less than 2 seconds.     Coloration: Skin is not pale.     Findings: No erythema or rash.  Neurological:     Mental Status: He is alert and oriented to person, place, and time.     Cranial Nerves: No cranial nerve deficit.     Motor: No abnormal muscle tone.     Coordination: Coordination normal.     Gait: Gait normal.     Deep Tendon Reflexes: Reflexes are normal and symmetric.  Psychiatric:        Mood and Affect: Mood normal.        Behavior: Behavior normal. Behavior is cooperative.        Thought Content: Thought content normal.        Judgment: Judgment normal.  Assessment/Plan: 1. Encounter for general adult medical examination with abnormal findings Age-appropriate preventive screenings and vaccinations discussed, annual physical exam completed. Routine labs for health maintenance not ordered. He has had several labs drawns in the past few months and will have preop labs done soon for knee  replacement surgery. PHM updated.   2. Chronic pain of left knee Takes tramadol for pain, having knee replacement surgery in January.  - traMADol (ULTRAM) 50 MG tablet; Take 1 tablet (50 mg total) by mouth every 12 (twelve) hours as needed for moderate pain or severe pain.  Dispense: 30 tablet; Refill: 0  3. Centrilobular emphysema ([place]) Takes trelegy, stable. Lungs are clear.   4. Aortic atherosclerosis (HCC) Is on plavix and aspirin, not currently on statin therapy, may need to discuss at next office visit.   5. Benign prostatic hyperplasia with urinary frequency Refills ordered.  - tamsulosin (FLOMAX) 0.4 MG CAPS capsule; Take 1 capsule (0.4 mg total) by mouth daily.  Dispense: 90 capsule; Refill: 1  6. Dysuria Routine urinalysis done - UA/M w/rflx Culture, Routine - Microscopic Examination  7. Personal history of gout Takes allopurinol as a preventive - allopurinol (ZYLOPRIM) 100 MG tablet; Take 2 tab po daily  Dispense: 60 tablet; Refill: 3  8. Cigarette nicotine dependence with nicotine-induced disorder Patient is taking bupropion and  - buPROPion (WELLBUTRIN SR) 150 MG 12 hr tablet; Take 1 tablet (150 mg total) by mouth 2 (two) times daily.  Dispense: 90 tablet; Refill: 1  9. Depression, major, single episode, mild ([place]) Switched back to bupropion because it worked better for  - buPROPion (WELLBUTRIN SR) 150 MG 12 hr tablet; Take 1 tablet (150 mg total) by mouth 2 (two) times daily.  Dispense: 90 tablet; Refill: 1      General Counseling: [name] [name] verbalizes understanding of the findings of todays visit and agrees with plan of treatment. I have discussed any further diagnostic evaluation that may be needed or ordered today. We also reviewed his medications today. he has been encouraged to call the office with any questions or concerns that should arise related to todays visit.    Orders Placed This Encounter  Procedures   Microscopic Examination   UA/M w/rflx Culture,  Routine    Meds ordered this encounter  Medications   buPROPion (WELLBUTRIN SR) 150 MG 12 hr tablet    Sig: Take 1 tablet (150 mg total) by mouth 2 (two) times daily.    Dispense:  90 tablet    Refill:  1   traMADol (ULTRAM) 50 MG tablet    Sig: Take 1 tablet (50 mg total) by mouth every 12 (twelve) hours as needed for moderate pain or severe pain.    Dispense:  30 tablet    Refill:  0   allopurinol (ZYLOPRIM) 100 MG tablet    Sig: Take 2 tab po daily    Dispense:  60 tablet    Refill:  3   tamsulosin (FLOMAX) 0.4 MG CAPS capsule    Sig: Take 1 capsule (0.4 mg total) by mouth daily.    Dispense:  90 capsule    Refill:  1    Return in about 3 months (around 12/03/2021) for F/U, med refill, Whit Bruni PCP.   Total time spent:30 Minutes Time spent includes review of chart, medications, test results, and follow up plan with the patient.   [place] Controlled Substance Database was reviewed by me.  This patient was seen by Jonetta Osgood, FNP-C in collaboration with Dr. Latricia Heft  Humphrey Rolls as a part of collaborative care agreement.  Aijalon Kirtz R. Valetta Fuller, MSN, FNP-C Internal medicine

## 2021-09-05 LAB — UA/M W/RFLX CULTURE, ROUTINE
Bilirubin, UA: NEGATIVE
Glucose, UA: NEGATIVE
Ketones, UA: NEGATIVE
Leukocytes,UA: NEGATIVE
Nitrite, UA: NEGATIVE
Protein,UA: NEGATIVE
RBC, UA: NEGATIVE
Specific Gravity, UA: 1.015 (ref 1.005–1.030)
Urobilinogen, Ur: 0.2 mg/dL (ref 0.2–1.0)
pH, UA: 5.5 (ref 5.0–7.5)

## 2021-09-05 LAB — MICROSCOPIC EXAMINATION
Bacteria, UA: NONE SEEN
Casts: NONE SEEN /lpf
Epithelial Cells (non renal): NONE SEEN /hpf (ref 0–10)
RBC, Urine: NONE SEEN /hpf (ref 0–2)

## 2021-09-20 ENCOUNTER — Encounter: Payer: Self-pay | Admitting: Nurse Practitioner

## 2021-09-29 ENCOUNTER — Other Ambulatory Visit: Payer: Self-pay | Admitting: Nurse Practitioner

## 2021-09-29 DIAGNOSIS — F320 Major depressive disorder, single episode, mild: Secondary | ICD-10-CM

## 2021-10-01 ENCOUNTER — Other Ambulatory Visit: Payer: Self-pay | Admitting: Nurse Practitioner

## 2021-10-06 ENCOUNTER — Other Ambulatory Visit: Payer: Self-pay | Admitting: Nurse Practitioner

## 2021-10-06 DIAGNOSIS — F17219 Nicotine dependence, cigarettes, with unspecified nicotine-induced disorders: Secondary | ICD-10-CM

## 2021-10-06 MED ORDER — VARENICLINE TARTRATE 1 MG PO TABS: 1.0000 mg | ORAL_TABLET | Freq: Two times a day (BID) | ORAL | 0 refills | Status: DC

## 2021-10-06 NOTE — Telephone Encounter (Signed)
Med sent to pharmacy.

## 2021-10-12 ENCOUNTER — Telehealth: Payer: Self-pay | Admitting: Student-PharmD

## 2021-10-12 NOTE — Progress Notes (Addendum)
General Review Call   [name] [name], [name] [name] [id number] 75 years, Male  DOB: 01-01-1956  M: [phone]  General Review [facility]) Completed by Charlann Lange on 10/12/2021  Chart Review  What recent interventions/DTPs have been made by any provider to improve the patient's conditions in the last 3 months?:   Office Visit: 09/04/21 Jonetta Osgood, NP. For Dysuira. STOPPED Indomethacin.  Any recent hospitalizations or ED visits since last visit with CPP?: No  Adherence Review Adherence rates for STAR metric medications: None.  Adherence rates for medications indicated for disease state being reviewed: None.  Does the patient have >5 day gap between last estimated fill dates for any of the above medications?: No  Disease State Questions Able to connect with the Patient?: Yes  Did patient have any problems with their health recently?: No  Did patient have any problems with their pharmacy?: No  Does patient have any issues or side effects with their medications?: No  Additional  information to pass to Patient's CPP?: No  Anything we can do to help take better care of Patient?: No  Misc. Response/Information:: Patient stated is in the process of getting a knee replacement.  Charlann Lange, [place]  [phone]  Pharmacist Review Adherence gaps identified?: No Drug Therapy Problems identified?: No Assessment: Controlled  Alena Bills Clinical Pharmacist

## 2021-10-13 DIAGNOSIS — M25562 Pain in left knee: Secondary | ICD-10-CM | POA: Diagnosis not present

## 2021-10-27 ENCOUNTER — Ambulatory Visit: Payer: Medicare Other | Admitting: Cardiovascular Disease

## 2021-10-27 ENCOUNTER — Other Ambulatory Visit: Payer: Self-pay

## 2021-10-27 ENCOUNTER — Encounter: Payer: Self-pay | Admitting: Cardiovascular Disease

## 2021-10-27 VITALS — BP 110/70 | HR 71 | Ht 72.0 in | Wt 221.0 lb

## 2021-10-27 DIAGNOSIS — E785 Hyperlipidemia, unspecified: Secondary | ICD-10-CM | POA: Diagnosis not present

## 2021-10-27 DIAGNOSIS — Z720 Tobacco use: Secondary | ICD-10-CM

## 2021-10-27 DIAGNOSIS — J449 Chronic obstructive pulmonary disease, unspecified: Secondary | ICD-10-CM

## 2021-10-27 DIAGNOSIS — I2510 Atherosclerotic heart disease of native coronary artery without angina pectoris: Secondary | ICD-10-CM

## 2021-10-27 DIAGNOSIS — Z01810 Encounter for preprocedural cardiovascular examination: Secondary | ICD-10-CM

## 2021-10-27 MED ORDER — ATORVASTATIN CALCIUM 80 MG PO TABS: 80.0000 mg | ORAL_TABLET | Freq: Every day | ORAL | 3 refills | Status: DC

## 2021-10-27 NOTE — Progress Notes (Signed)
Cardiology Office Note   Date:  10/27/2021   ID:  [name] [name], DOB [birth year], MRN [id number]  PCP:  Jonetta Osgood, NP  Cardiologist:   Kathlyn Sacramento, MD   Chief Complaint  Patient presents with   Other    3 Month f/u discuss future holding of Blood thinner shoulder surgery to be determined. Meds reviewed verbally with pt.      History of Present Illness: [name] [name] is a 66 y.o. male who presents for a follow-up visit regarding coronary artery disease. He has known history of COPD, tobacco use, hyperlipidemia and depression. He presented in August 2022 with non-ST elevation myocardial infarction.  Cardiac catheterization showed significant two-vessel coronary artery disease with occluded right coronary artery with well-developed left-to-right collaterals and 60% stenosis in the mid to distal left circumflex.  Ejection fraction was normal by echo.  Elevated troponin was felt to be due to supply demand ischemia although plaque rupture in the mid to distal left circumflex could not be completely excluded.  Clopidogrel was added.  He has been doing well with no recent chest pain, shortness of breath or palpitations.  He does have significant left knee and calf discomfort with walking.  He might be requiring knee replacement in the near future. He cut down on tobacco use to 6 cigarettes a day.    Past Medical History:  Diagnosis Date   Arthritis    COPD (chronic obstructive pulmonary disease) ([place])    Depression    Emphysema of lung ([place])    Gout    left knee   Heart attack [facility])     Past Surgical History:  Procedure Laterality Date   CARDIAC CATHETERIZATION     COLONOSCOPY WITH PROPOFOL N/A 04/07/2021   Procedure: COLONOSCOPY WITH PROPOFOL;  Surgeon: Virgel Manifold, MD;  Location: [facility] ENDOSCOPY;  Service: Endoscopy;  Laterality: N/A;   LEFT HEART CATH AND CORONARY ANGIOGRAPHY N/A 05/18/2021   Procedure: LEFT HEART CATH AND CORONARY ANGIOGRAPHY;   Surgeon: Wellington Hampshire, MD;  Location: [place] CV LAB;  Service: Cardiovascular;  Laterality: N/A;     Current Outpatient Medications  Medication Sig Dispense Refill   albuterol (VENTOLIN HFA) 108 (90 Base) MCG/ACT inhaler Inhale 2 puffs into the lungs every 6 (six) hours as needed for wheezing or shortness of breath. 8 g 2   allopurinol (ZYLOPRIM) 100 MG tablet Take 2 tab po daily 60 tablet 3   aspirin EC 81 MG EC tablet Take 1 tablet (81 mg total) by mouth daily. Swallow whole. 30 tablet 11   buPROPion (WELLBUTRIN SR) 150 MG 12 hr tablet Take 1 tablet (150 mg total) by mouth 2 (two) times daily. 90 tablet 1   clopidogrel (PLAVIX) 75 MG tablet Take 1 tablet (75 mg total) by mouth daily with breakfast. 30 tablet 2   DULoxetine (CYMBALTA) 30 MG capsule TAKE 1 CAPSULE(30 MG) BY MOUTH DAILY 30 capsule 3   Fluticasone-Umeclidin-Vilant (TRELEGY ELLIPTA) 100-62.5-25 MCG/INH AEPB Inhale 1 puff into the lungs daily. 1 each 11   meloxicam (MOBIC) 7.5 MG tablet TAKE 1 TABLET(7.5 MG) BY MOUTH DAILY 30 tablet 3   phenazopyridine (PYRIDIUM) 200 MG tablet Take 1 tablet (200 mg total) by mouth 3 (three) times daily as needed for pain. 10 tablet 0   sildenafil (VIAGRA) 100 MG tablet Take 1 tablet (100 mg total) by mouth daily as needed for erectile dysfunction. 10 tablet 0   tamsulosin (FLOMAX) 0.4 MG CAPS capsule Take 1 capsule (0.4 mg  total) by mouth daily. 90 capsule 1   tolterodine (DETROL LA) 4 MG 24 hr capsule Take 1 capsule (4 mg total) by mouth daily. 90 capsule 1   traMADol (ULTRAM) 50 MG tablet Take 1 tablet (50 mg total) by mouth every 12 (twelve) hours as needed for moderate pain or severe pain. 30 tablet 0   varenicline (CHANTIX) 1 MG tablet TAKE 1 TABLET(1 MG) BY MOUTH TWICE DAILY 180 tablet 0   No current facility-administered medications for this visit.    Allergies:   Patient has no known allergies.    Social History:  The patient  reports that he has been smoking cigarettes. He  has a 104.00 pack-year smoking history. He has never used smokeless tobacco. He reports that he does not currently use alcohol. He reports that he does not use drugs.   Family History:  The patient's family history includes Alcohol abuse in his brother, father, and mother.    ROS:  Please see the history of present illness.   Otherwise, review of systems are positive for none.   All other systems are reviewed and negative.    PHYSICAL EXAM: VS:  BP 110/70 (BP Location: Left Arm, Patient Position: Sitting, Cuff Size: Normal)    Pulse 71    Ht 6' (1.829 m)    Wt 221 lb (100.2 kg)    SpO2 98%    BMI 29.97 kg/m  , BMI Body mass index is 29.97 kg/m. GEN: Well nourished, well developed, in no acute distress  HEENT: normal  Neck: no JVD, carotid bruits, or masses Cardiac: RRR; no murmurs, rubs, or gallops,no edema  Respiratory:  clear to auscultation bilaterally, normal work of breathing GI: soft, nontender, nondistended, + BS MS: no deformity or atrophy  Skin: warm and dry, no rash Neuro:  Strength and sensation are intact Psych: euthymic mood, full affect Vascular: Femoral pulses are normal bilaterally.  Dorsalis pedis and posterior tibial are palpable on both sides.  EKG:  EKG is not ordered today.    Recent Labs: 05/17/2021: B Natriuretic Peptide 112.4; TSH 1.198 05/19/2021: Hemoglobin 13.8; Platelets 155 05/20/2021: BUN 15; Creatinine, Ser 1.18; Potassium 4.9; Sodium 143 07/17/2021: ALT 20    Lipid Panel    Component Value Date/Time   CHOL 111 07/17/2021 1410   TRIG 111 07/17/2021 1410   HDL 34 (L) 07/17/2021 1410   CHOLHDL 3.3 07/17/2021 1410   CHOLHDL 4.4 05/18/2021 0424   VLDL 17 05/18/2021 0424   LDLCALC 56 07/17/2021 1410   LDLDIRECT 54 07/17/2021 1410      Wt Readings from Last 3 Encounters:  10/27/21 221 lb (100.2 kg)  09/04/21 216 lb 6.4 oz (98.2 kg)  08/03/21 217 lb 6.4 oz (98.6 kg)       No flowsheet data found.    ASSESSMENT AND PLAN:  1.  Coronary  artery disease involving native coronary arteries without angina: He is doing well overall with no anginal symptoms.  Continue medical therapy.  He is on clopidogrel to be continued until August of this year but it can be interrupted for surgery.  2.  Hyperlipidemia: He does not know why he is no longer on atorvastatin but it looks like he ran out of prescription.  I refilled atorvastatin and discussed with him the rationale for lifelong treatment with this.  His LDL while he was taking the medication was 54.  3.  Tobacco use: He cut down on tobacco use but has not been able to quit smoking.  4.  Left leg discomfort: Does not seem to be vascular as he has normal pulses.  Likely due to left knee arthritis.  5.  Preoperative cardiovascular evaluation: The patient has no anginal symptoms and has good functional capacity.  He does not require ischemic cardiac work-up before knee replacement.  Clopidogrel can be held 5 days before the surgery.    Disposition:   FU in 6 months.  Signed,  Kathlyn Sacramento, MD  10/27/2021 3:13 PM    [facility]

## 2021-10-27 NOTE — Patient Instructions (Signed)
Medication Instructions:  Your physician has recommended you make the following change in your medication:   RESUME Atorvastatin 80 mg daily. An Rx has been sent to your pharmacy.  *If you need a refill on your cardiac medications before your next appointment, please call your pharmacy*   Lab Work: None ordered If you have labs (blood work) drawn today and your tests are completely normal, you will receive your results only by: [place] (if you have MyChart) OR A paper copy in the mail If you have any lab test that is abnormal or we need to change your treatment, we will call you to review the results.   Testing/Procedures: None ordered   Follow-Up: At [facility], you and your health needs are our priority.  As part of our continuing mission to provide you with exceptional heart care, we have created designated Provider Care Teams.  These Care Teams include your primary Cardiologist (physician) and Advanced Practice Providers (APPs -  Physician Assistants and Nurse Practitioners) who all work together to provide you with the care you need, when you need it.  We recommend signing up for the patient portal called "MyChart".  Sign up information is provided on this After Visit Summary.  MyChart is used to connect with patients for Virtual Visits (Telemedicine).  Patients are able to view lab/test results, encounter notes, upcoming appointments, etc.  Non-urgent messages can be sent to your provider as well.   To learn more about what you can do with MyChart, go to [email or web address].    Your next appointment:   Your physician wants you to follow-up in: 6 months You will receive a reminder letter in the mail two months in advance. If you don't receive a letter, please call our office to schedule the follow-up appointment.   The format for your next appointment:   In Person  Provider:   You may see Kathlyn Sacramento, MD or one of the following Advanced Practice Providers on  your designated Care Team:   Murray Hodgkins, NP Christell Faith, PA-C Cadence Kathlen Mody, [place]   Other Instructions HOLD Plavix 5 days prior to your knee replacement

## 2021-10-28 DIAGNOSIS — J449 Chronic obstructive pulmonary disease, unspecified: Secondary | ICD-10-CM | POA: Diagnosis not present

## 2021-10-28 DIAGNOSIS — F17210 Nicotine dependence, cigarettes, uncomplicated: Secondary | ICD-10-CM | POA: Diagnosis not present

## 2021-11-03 DIAGNOSIS — M1712 Unilateral primary osteoarthritis, left knee: Secondary | ICD-10-CM | POA: Diagnosis not present

## 2021-11-04 ENCOUNTER — Other Ambulatory Visit: Payer: Self-pay

## 2021-11-04 ENCOUNTER — Ambulatory Visit: Payer: Medicare Other | Admitting: Student-PharmD

## 2021-11-04 ENCOUNTER — Telehealth: Payer: Self-pay | Admitting: Cardiovascular Disease

## 2021-11-04 DIAGNOSIS — F17219 Nicotine dependence, cigarettes, with unspecified nicotine-induced disorders: Secondary | ICD-10-CM

## 2021-11-04 DIAGNOSIS — I700 Atherosclerosis of aorta: Secondary | ICD-10-CM

## 2021-11-04 DIAGNOSIS — F320 Major depressive disorder, single episode, mild: Secondary | ICD-10-CM

## 2021-11-04 NOTE — Telephone Encounter (Signed)
° °  Pre-operative Risk Assessment    Patient Name: [name] [name]  DOB: [birth year] MRN: [id number]      Request for Surgical Clearance    Procedure:  Lt TKA tradional knee  Date of Surgery:  Clearance 11/26/21                                 Surgeon:  Dr. Kurtis Bushman Surgeon's Group or Practice Name:  [facility] Phone number:  not listed Fax number:  not listed   Type of Clearance Requested:  Pharmacy plavix     Type of Anesthesia:  Spinal   Additional requests/questions:    Signed, Pilar A Ham   11/04/2021, 3:23 PM

## 2021-11-04 NOTE — Telephone Encounter (Signed)
° °  Pre-operative Risk Assessment    Patient Name: [name] [name]  DOB: 07/19/1956 MRN: [id number]      Request for Surgical Clearance    Procedure:   left total knee arthroplasty  Date of Surgery:  Clearance 11/26/21                                 Surgeon:  Dr Mike Gip Group or Practice Name:  [facility] Phone number:  [phone] Fax number:  [phone]   Type of Clearance Requested:   - Pharmacy:  Hold Clopidogrel (Plavix) hold 7 days    Type of Anesthesia:   local / spinal    Additional requests/questions:    Manfred Arch   11/04/2021, 4:39 PM

## 2021-11-04 NOTE — Progress Notes (Signed)
Follow Up Pharmacist Visit  [name] [name], [name] [name] [id number] 57 years, Male  DOB: [birth year]  M: [phone]  Clinical Summary Patient Risk: Moderate Next CCM Follow Up: 07/07/22 Next AWV: 08/27/22 Next PCP Visit: 12/04/21 Summary for PCP: Patient endorses adherence to all of his medications. Reports that his mood is currently stable and he is satisfied with his medication treatment. Patient reports he has a knee replacement surgery scheduled for 3/9 and plans on getting back into exercise following recovery. Patient would still benefit from addition of a statin (failed lipitor previously due to muscle pain). Per PCP note, plan is to discuss at next visit on 3/17.  Patient Chart Prep  Completed by [name] [name] on 11/02/2021  Chronic Conditions Patient's Chronic Conditions: Cardiovascular Disease (CVD), Benign Prostatic Hyperplasia (BPH), Depression  Doctor and Hospital Visits Were there PCP Visits since last visit with the Pharmacist?: No Were there Specialist Visits since last visit with the Pharmacist?: Yes Visit #1: 10/27/21 Cardiology Wellington Hampshire, MD. Coronary artery disease. STARTED Atorvastatin Calcium 80 mg daily. Was there a Hospital Visit in last 30 days?: No Were there other Hospital Visits since last visit with the Pharmacist?: No  Medication Information Have there been any medication changes from PCP or Specialist since last visit with the Pharmacist?: No Are there any Medication adherence gaps (beyond 5 days past due)?: Yes Details: Atorvastatin 80 mg 05/19/21 90 DS Medication adherence rates for the STAR rating drugs: Atorvastatin 80 mg 05/19/21 90 DS List Patient's current Care Gaps: No current Care Gaps identified  Pre-Call Questions  Completed by [name] [name] on 11/02/2021 Are you able to connect with Patient: Yes Confirmed appointment date/time with patient/caregiver?: Yes Date/time of the appointment: 11/04/21 10:00 AM Visit type:  Phone Patient/Caregiver instructed to bring medications to appointment: Yes What, if any, problems do you have getting your medications from the pharmacy?: None What is your top health concern to discuss at your upcoming visit?: Patient stated none. Have you seen any other providers since your last visit?: No  Disease Assessments  Subjective Information Current BP: 110/70 Current HR: 71 taken on: 10/27/2021 Weight: 221 BMI: 29.97 Last GFR: 68 taken on: 05/19/2021 Why did the patient present?: CCM F/U Visit  Depression Assess this condition today?: Yes In your opinion, how do you feel your depression symptoms have been controlled over the past 3 months?: Stable / stayed the same Assessment:: Controlled Drug: Bupropion SR 150mg  twice daily Assessment: Appropriate, Effective, Safe, Accessible Drug: Cymbalta 30mg  1 capsule daily Assessment: Appropriate, Effective, Safe, Accessible Additional Info: Patient states he will still occasionally feel down but feels like his medication is working well for him, and is satisfied with his mood where it currently stands. Plan to: Continue medication therapy HC Follow up: 4 months Pharmacist Follow up: 8 months  Cardiovascular Disease (CVD) Assess this condition today?: Yes Is Patient taking statin medication?: No Reason patient is not taking statin(s): had myopathy with lipitor, is in discussion with pcp to start new trial Is patient currently Smoking or Vaping?: Yes See Tobacco Use Disorder section for details: Done Is patient currently on antiplatelet therapy?: Yes Is patient experiencing any abnormal bruising or bleeding?: No Patient has the following: Coronary Artery Disease (CAD) CAD: Prior events / interventions (include dates if available): heart attack last year Does patient have an Rx for nitroglycerin?: No Has patient experienced any chest pain?: No We discussed: Smoking cessation Assessment:: Controlled Drug: Aspirin 81mg  1  tab daily Assessment: Appropriate, Effective, Safe, Accessible Drug: Clopidogrel  75mg  1 tablet daily Assessment: Appropriate, Effective, Safe, Accessible Additional Info: Patient states he has not currently gotten back into any exercise. Has been suffering from knee pain and is having a knee replacement surgery scheduled for 3/9. Plan to: Continue medication therapy HC Follow up: 4 months Pharmacist Follow up: 8 months  Exercise, Diet and Non-Drug Coordination Needs Additional exercise counseling points. We discussed: targeting at least 151 minutes per week of moderate-intensity aerobic exercise., decreasing sedentary behavior Additional diet counseling points. We discussed: aiming to consume at least 8 cups of water day Discussed Non-Drug Care Coordination Needs: Yes Does Patient have Medication financial barriers?: No  Accountable Health Communities Health-Related Social Needs Screening Tool -  SDOH  What is your living situation today? (ref #1): I have a steady place to live Think about the place you live. Do you have problems with any of the following? (ref #2): None of the above Within the past 12 months, you worried that your food would run out before you got money to buy more (ref #3): Never true Within the past 12 months, the food you bought just didn't last and you didn't have money to get more (ref #4): Never true In the past 12 months, has lack of reliable transportation kept you from medical appointments, meetings, work or from getting things needed for daily living? (ref #5): No In the past 12 months, has the electric, gas, oil, or water company threatened to shut off services in your home? (ref #6): No How often does anyone, including family and friends, physically hurt you? (ref #7): Never (1) How often does anyone, including family and friends, insult or talk down to you? (ref #8): Never (1) How often does anyone, including friends and family, threaten you with harm? (ref  #9): Never (1) How often does anyone, including family and friends, scream or curse at you? (ref #10): Never (1)  Engagement Notes [name], [name] on 11/02/2021 02:23 PM [facility] Chart/ CP prep: 15 min 11/02/21 CPP Chart Review: 16 min 10/27/21 CPP Office Visit: 10 min 11/04/21 CPP Office Visit Documentation: 20 min 11/04/21  Steps to Quit Smoking Smoking tobacco is the leading cause of preventable death. It can affect almost every organ in the body. Smoking puts you and people around you at risk for many serious, long-lasting (chronic) diseases. Quitting smoking can be hard, but it is one of the best things that you can do for your health. It is never too late to quit. How do I get ready to quit? When you decide to quit smoking, make a plan to help you succeed. Before you quit: Pick a date to quit. Set a date within the next 2 weeks to give you time to prepare. Write down the reasons why you are quitting. Keep this list in places where you will see it often. Tell your family, friends, and co-workers that you are quitting. Their support is important. Talk with your doctor about the choices that may help you quit. Find out if your health insurance will pay for these treatments. Know the people, places, things, and activities that make you want to smoke (triggers). Avoid them. What first steps can I take to quit smoking? Throw away all cigarettes at home, at work, and in your car. Throw away the things that you use when you smoke, such as ashtrays and lighters. Clean your car. Make sure to empty the ashtray. Clean your home, including curtains and carpets. What can I do to help me quit smoking?  Talk with your doctor about taking medicines and seeing a counselor at the same time. You are more likely to succeed when you do both. If you are pregnant or breastfeeding, talk with your doctor about counseling or other ways to quit smoking. Do not take medicine to help you quit smoking unless your doctor  tells you to do so. To quit smoking: Quit right away Quit smoking totally, instead of slowly cutting back on how much you smoke over a period of time. Go to counseling. You are more likely to quit if you go to counseling sessions regularly. Take medicine You may take medicines to help you quit. Some medicines need a prescription, and some you can buy over-the-counter. Some medicines may contain a drug called nicotine to replace the nicotine in cigarettes. Medicines may: Help you to stop having the desire to smoke (cravings). Help to stop the problems that come when you stop smoking (withdrawal symptoms). Your doctor may ask you to use: Nicotine patches, gum, or lozenges. Nicotine inhalers or sprays. Non-nicotine medicine that is taken by mouth. Find resources Find resources and other ways to help you quit smoking and remain smoke-free after you quit. These resources are most helpful when you use them often. They include: Online chats with a Social worker. Phone quitlines. Printed Furniture conservator/restorer. Support groups or group counseling. Text messaging programs. Mobile phone apps. Use apps on your mobile phone or tablet that can help you stick to your quit plan. There are many free apps for mobile phones and tablets as well as websites. Examples include Quit Guide from the [employer] and smokefree.gov   What things can I do to make it easier to quit? Talk to your family and friends. Ask them to support and encourage you. Call a phone quitline (1-800-QUIT-NOW), reach out to support groups, or work with a Social worker. Ask people who smoke to not smoke around you. Avoid places that make you want to smoke, such as: Bars. Parties. Smoke-break areas at work. Spend time with people who do not smoke. Lower the stress in your life. Stress can make you want to smoke. Try these things to help your stress: Getting regular exercise. Doing deep-breathing exercises. Doing yoga. Meditating. Doing a body scan.  To do this, close your eyes, focus on one area of your body at a time from head to toe. Notice which parts of your body are tense. Try to relax the muscles in those areas. How will I feel when I quit smoking? Day 1 to 3 weeks Within the first 24 hours, you may start to have some problems that come from quitting tobacco. These problems are very bad 2-3 days after you quit, but they do not often last for more than 2-3 weeks. You may get these symptoms: Mood swings. Feeling restless, nervous, angry, or annoyed. Trouble concentrating. Dizziness. Strong desire for high-sugar foods and nicotine. Weight gain. Trouble pooping (constipation). Feeling like you may vomit (nausea). Coughing or a sore throat. Changes in how the medicines that you take for other issues work in your body. Depression. Trouble sleeping (insomnia). Week 3 and afterward After the first 2-3 weeks of quitting, you may start to notice more positive results, such as: Better sense of smell and taste. Less coughing and sore throat. Slower heart rate. Lower blood pressure. Clearer skin. Better breathing. Fewer sick days. Quitting smoking can be hard. Do not give up if you fail the first time. Some people need to try a few times before they succeed. Do  your best to stick to your quit plan, and talk with your doctor if you have any questions or concerns. Summary Smoking tobacco is the leading cause of preventable death. Quitting smoking can be hard, but it is one of the best things that you can do for your health. When you decide to quit smoking, make a plan to help you succeed. Quit smoking right away, not slowly over a period of time. When you start quitting, seek help from your doctor, family, or friends. This information is not intended to replace advice given to you by your health care provider. Make sure you discuss any questions you have with your health care provider.      Alena Bills Clinical  Pharmacist [phone]

## 2021-11-04 NOTE — Telephone Encounter (Signed)
° °  Patient Name: Orvin Netter  DOB: 1956/05/11 MRN: [id number]  Primary Cardiologist: Dr. Fletcher Anon  Chart reviewed as part of pre-operative protocol coverage. Patient was recently evaluated by Dr. Fletcher Anon in clinic 10/27/21. Per his note, " Preoperative cardiovascular evaluation: The patient has no anginal symptoms and has good functional capacity.  He does not require ischemic cardiac work-up before knee replacement.  Clopidogrel can be held 5 days before the surgery." If the surgeon feels that Clopidogrel must absolutely be held for 7 days instead of recommended 5 days, please call the office and we will plan to review again with Dr. Fletcher Anon. Otherwise, will route copy of this message and his recent office note to requesting surgeon.  Please call with questions.  Charlie Pitter, PA-C 11/04/2021, 5:29 PM

## 2021-11-05 NOTE — Telephone Encounter (Signed)
Dr Fletcher Anon You saw this patient on 10/27/21 and cleared him for surgery with a 5 day plavix hold. This was communicated to the requesting office.   Surgeon's office replied and requests a 7 day hold on plavix.   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

## 2021-11-05 NOTE — Telephone Encounter (Signed)
That is an unusual request for that type of surgery but given that he did not have a stent placed, we can accommodate that request.

## 2021-11-05 NOTE — Telephone Encounter (Signed)
Surgeon does indicate he wants plavix held for 7 days. Please call to discuss.

## 2021-11-06 NOTE — Telephone Encounter (Signed)
° °  Name: [name] [name]  DOB: [birth year]  MRN: [id number]   Primary Cardiologist: None  Chart reviewed as part of pre-operative protocol coverage. Patient was contacted 11/06/2021 in reference to pre-operative risk assessment for pending surgery as outlined below.  [name] [name] was last seen on 10/27/2021 by Dr. Fletcher Anon, and was without symptoms of angina or decompensation.  Dr. Fletcher Anon authorized the holding of Plavix for 7 days, as requested by the treating team. Resumption of Plavix should be undertaken as soon as safely possible per the treating team.   Therefore, based on ACC/AHA guidelines, the patient would be at acceptable risk for the planned procedure without further cardiovascular testing.   The patient was advised that if he develops new symptoms prior to surgery to contact our office to arrange for a follow-up visit, and he verbalized understanding.  I will route this recommendation to the requesting party via Epic fax function and remove from pre-op pool. Please call with questions.  Christell Faith, PA-C 11/06/2021, 10:05 AM

## 2021-11-17 ENCOUNTER — Telehealth: Payer: Self-pay | Admitting: Student-PharmD

## 2021-11-17 DIAGNOSIS — I2510 Atherosclerotic heart disease of native coronary artery without angina pectoris: Secondary | ICD-10-CM | POA: Diagnosis not present

## 2021-11-17 DIAGNOSIS — J449 Chronic obstructive pulmonary disease, unspecified: Secondary | ICD-10-CM | POA: Diagnosis not present

## 2021-11-17 NOTE — Progress Notes (Signed)
°  Chronic Care Management Pharmacy Assistant   Name: [name] [name] MRN: [id number] DOB: [birth year]  Reason for Encounter: Care Plan and Patient Handout   Medications: Outpatient Encounter Medications as of 11/17/2021  Medication Sig   albuterol (VENTOLIN HFA) 108 (90 Base) MCG/ACT inhaler Inhale 2 puffs into the lungs every 6 (six) hours as needed for wheezing or shortness of breath.   allopurinol (ZYLOPRIM) 100 MG tablet Take 2 tab po daily   aspirin EC 81 MG EC tablet Take 1 tablet (81 mg total) by mouth daily. Swallow whole.   atorvastatin (LIPITOR) 80 MG tablet Take 1 tablet (80 mg total) by mouth daily.   buPROPion (WELLBUTRIN SR) 150 MG 12 hr tablet Take 1 tablet (150 mg total) by mouth 2 (two) times daily.   clopidogrel (PLAVIX) 75 MG tablet Take 1 tablet (75 mg total) by mouth daily with breakfast.   DULoxetine (CYMBALTA) 30 MG capsule TAKE 1 CAPSULE(30 MG) BY MOUTH DAILY   Fluticasone-Umeclidin-Vilant (TRELEGY ELLIPTA) 100-62.5-25 MCG/INH AEPB Inhale 1 puff into the lungs daily.   meloxicam (MOBIC) 7.5 MG tablet TAKE 1 TABLET(7.5 MG) BY MOUTH DAILY   phenazopyridine (PYRIDIUM) 200 MG tablet Take 1 tablet (200 mg total) by mouth 3 (three) times daily as needed for pain.   sildenafil (VIAGRA) 100 MG tablet Take 1 tablet (100 mg total) by mouth daily as needed for erectile dysfunction.   tamsulosin (FLOMAX) 0.4 MG CAPS capsule Take 1 capsule (0.4 mg total) by mouth daily.   tolterodine (DETROL LA) 4 MG 24 hr capsule Take 1 capsule (4 mg total) by mouth daily.   traMADol (ULTRAM) 50 MG tablet Take 1 tablet (50 mg total) by mouth every 12 (twelve) hours as needed for moderate pain or severe pain.   varenicline (CHANTIX) 1 MG tablet TAKE 1 TABLET(1 MG) BY MOUTH TWICE DAILY   No facility-administered encounter medications on file as of 11/17/2021.    Reviewed the patients visit reinsured it was completed per the pharmacist Alena Bills request. Printed the CCM care plan. Mailed the  patient CCM care plan and patient handout to their most recent address on file.   Time: 3 min  Charlann Lange, [place]  [phone]

## 2021-11-20 DIAGNOSIS — M1712 Unilateral primary osteoarthritis, left knee: Secondary | ICD-10-CM | POA: Diagnosis not present

## 2021-11-22 IMAGING — CR DG CHEST 2V
1 series · 2 of 2 positions shown · non-contrast
Comparison: None.

CLINICAL DATA: Chest pain.

EXAM:
CHEST - 2 VIEW

[Series 1: dg chest 2 view · 0.14mm/px · 2 of 2 slices shown]
[im 1/2]
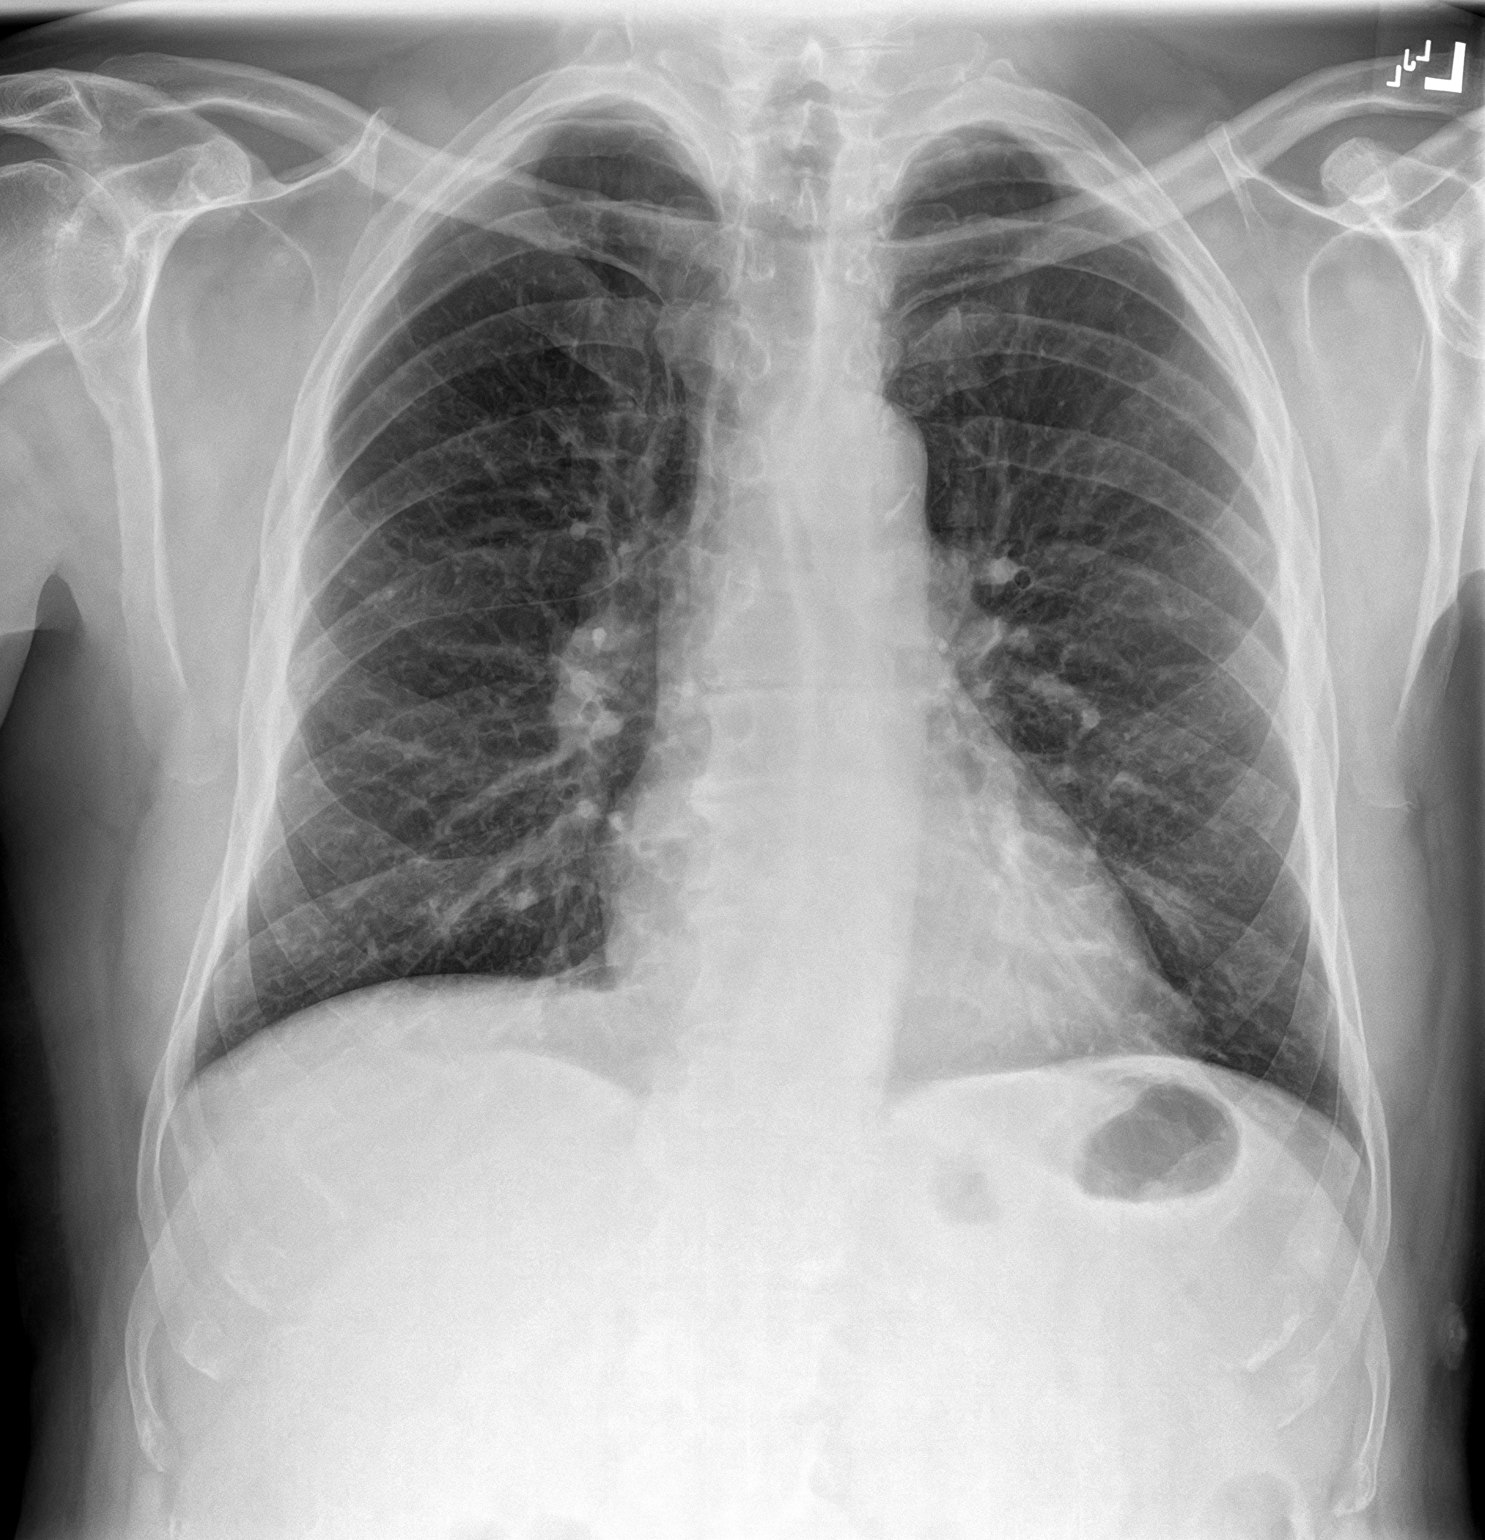
[im 2/2]
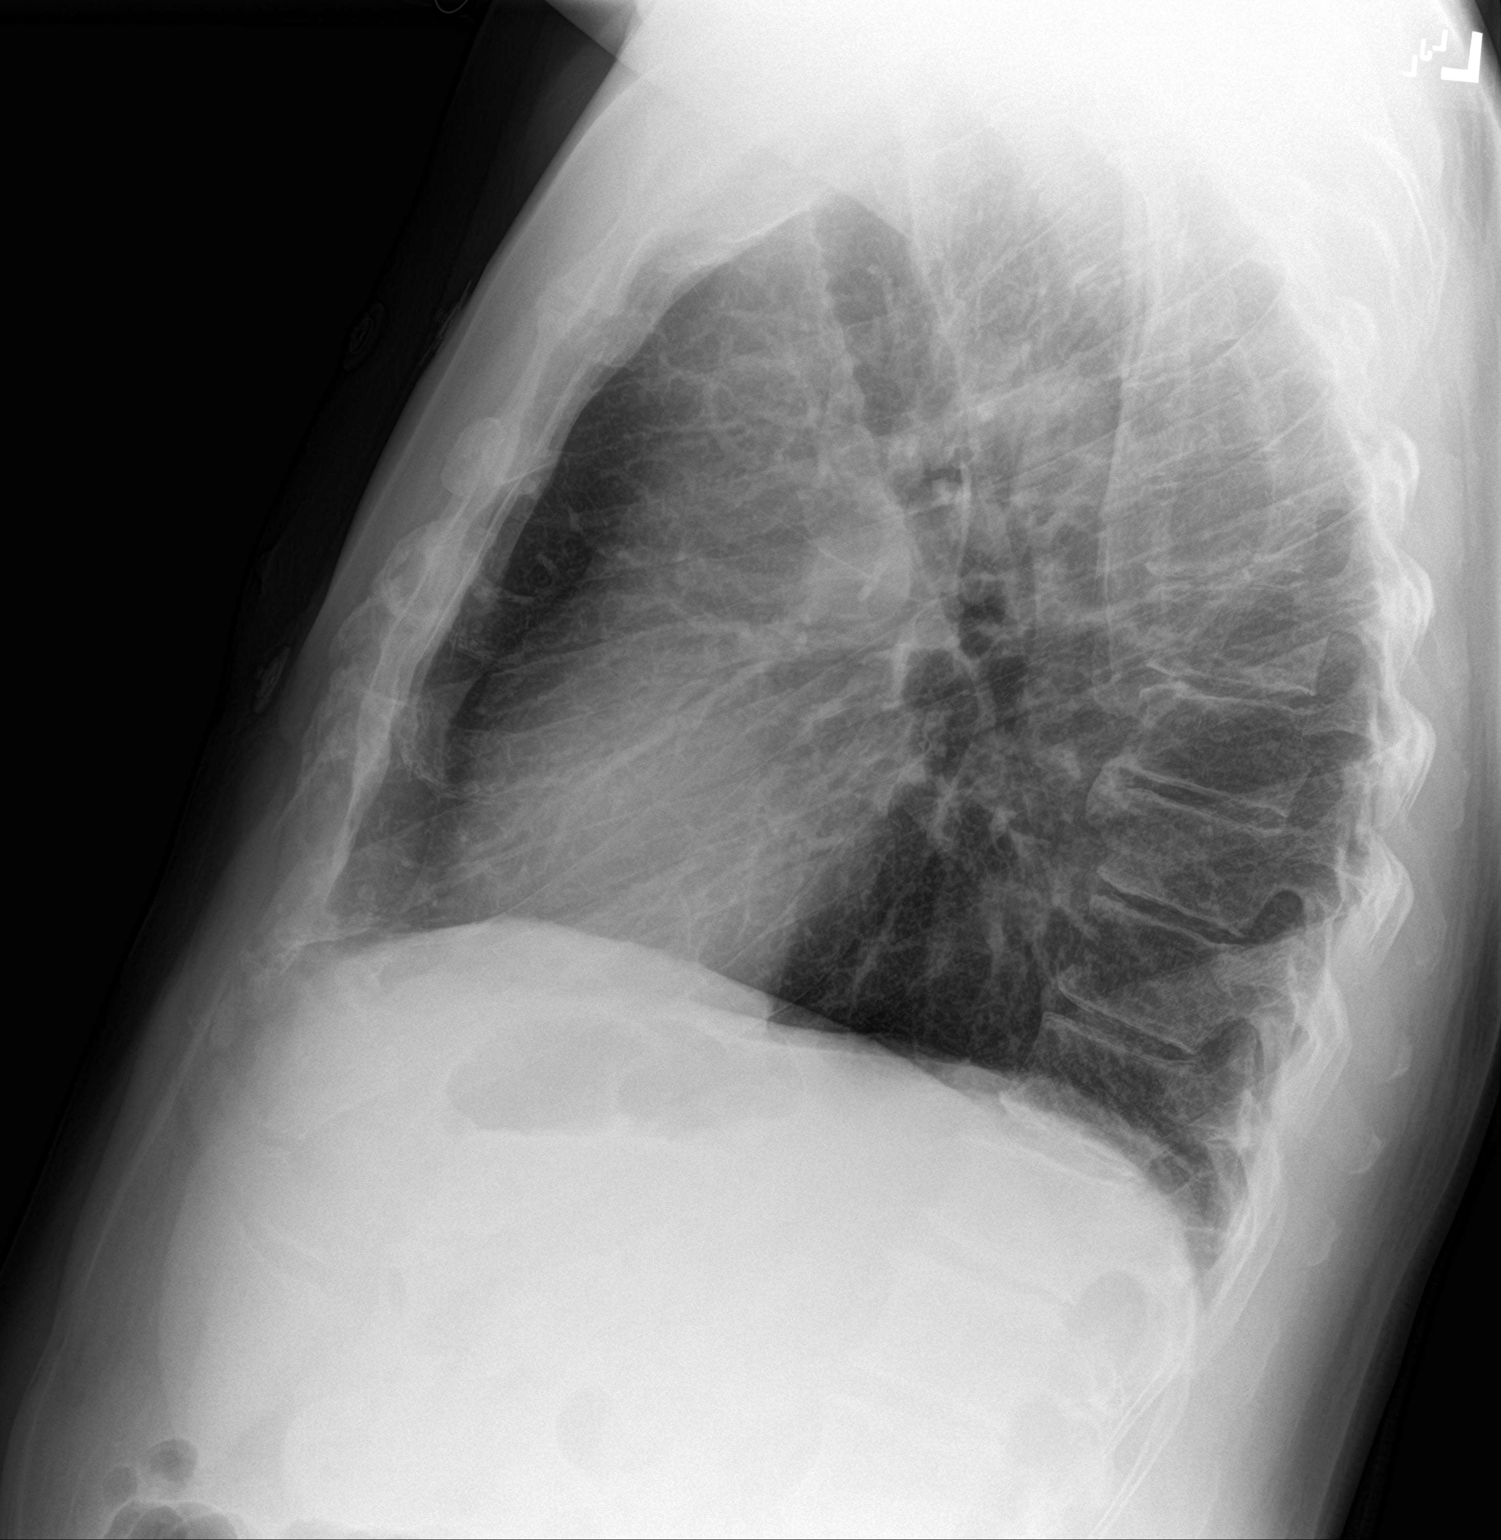

[2 of 2 positions shown; findings below may reference images not displayed]

FINDINGS: The heart size and mediastinal contours are within normal limits.
Both lungs are clear. The visualized skeletal structures are
unremarkable.
IMPRESSION: No active cardiopulmonary disease.

## 2021-11-24 DIAGNOSIS — I2510 Atherosclerotic heart disease of native coronary artery without angina pectoris: Secondary | ICD-10-CM | POA: Diagnosis not present

## 2021-11-24 DIAGNOSIS — Z01812 Encounter for preprocedural laboratory examination: Secondary | ICD-10-CM | POA: Diagnosis not present

## 2021-11-26 DIAGNOSIS — I2510 Atherosclerotic heart disease of native coronary artery without angina pectoris: Secondary | ICD-10-CM | POA: Diagnosis not present

## 2021-11-26 DIAGNOSIS — J439 Emphysema, unspecified: Secondary | ICD-10-CM | POA: Diagnosis not present

## 2021-11-26 DIAGNOSIS — Z7982 Long term (current) use of aspirin: Secondary | ICD-10-CM | POA: Diagnosis not present

## 2021-11-26 DIAGNOSIS — F32A Depression, unspecified: Secondary | ICD-10-CM | POA: Diagnosis not present

## 2021-11-26 DIAGNOSIS — M25562 Pain in left knee: Secondary | ICD-10-CM | POA: Diagnosis not present

## 2021-11-26 DIAGNOSIS — E785 Hyperlipidemia, unspecified: Secondary | ICD-10-CM | POA: Diagnosis not present

## 2021-11-26 DIAGNOSIS — J449 Chronic obstructive pulmonary disease, unspecified: Secondary | ICD-10-CM | POA: Diagnosis not present

## 2021-11-26 DIAGNOSIS — Z87891 Personal history of nicotine dependence: Secondary | ICD-10-CM | POA: Diagnosis not present

## 2021-11-26 DIAGNOSIS — G8918 Other acute postprocedural pain: Secondary | ICD-10-CM | POA: Diagnosis not present

## 2021-11-26 DIAGNOSIS — M1712 Unilateral primary osteoarthritis, left knee: Secondary | ICD-10-CM | POA: Diagnosis not present

## 2021-11-26 DIAGNOSIS — M109 Gout, unspecified: Secondary | ICD-10-CM | POA: Diagnosis not present

## 2021-11-26 DIAGNOSIS — I252 Old myocardial infarction: Secondary | ICD-10-CM | POA: Diagnosis not present

## 2021-11-26 DIAGNOSIS — Z7902 Long term (current) use of antithrombotics/antiplatelets: Secondary | ICD-10-CM | POA: Diagnosis not present

## 2021-11-26 DIAGNOSIS — M25762 Osteophyte, left knee: Secondary | ICD-10-CM | POA: Diagnosis not present

## 2021-11-26 HISTORY — PX: REPLACEMENT TOTAL KNEE: SUR1224

## 2021-11-27 DIAGNOSIS — M1712 Unilateral primary osteoarthritis, left knee: Secondary | ICD-10-CM | POA: Diagnosis not present

## 2021-11-27 DIAGNOSIS — Z7982 Long term (current) use of aspirin: Secondary | ICD-10-CM | POA: Diagnosis not present

## 2021-11-27 DIAGNOSIS — Z7902 Long term (current) use of antithrombotics/antiplatelets: Secondary | ICD-10-CM | POA: Diagnosis not present

## 2021-11-27 DIAGNOSIS — Z87891 Personal history of nicotine dependence: Secondary | ICD-10-CM | POA: Diagnosis not present

## 2021-11-27 DIAGNOSIS — F32A Depression, unspecified: Secondary | ICD-10-CM | POA: Diagnosis not present

## 2021-11-27 DIAGNOSIS — M25762 Osteophyte, left knee: Secondary | ICD-10-CM | POA: Diagnosis not present

## 2021-11-27 DIAGNOSIS — E785 Hyperlipidemia, unspecified: Secondary | ICD-10-CM | POA: Diagnosis not present

## 2021-11-27 DIAGNOSIS — J439 Emphysema, unspecified: Secondary | ICD-10-CM | POA: Diagnosis not present

## 2021-11-27 DIAGNOSIS — M109 Gout, unspecified: Secondary | ICD-10-CM | POA: Diagnosis not present

## 2021-11-27 DIAGNOSIS — I252 Old myocardial infarction: Secondary | ICD-10-CM | POA: Diagnosis not present

## 2021-11-27 DIAGNOSIS — I2510 Atherosclerotic heart disease of native coronary artery without angina pectoris: Secondary | ICD-10-CM | POA: Diagnosis not present

## 2021-12-01 ENCOUNTER — Other Ambulatory Visit: Payer: Self-pay | Admitting: Nurse Practitioner

## 2021-12-03 DIAGNOSIS — M25562 Pain in left knee: Secondary | ICD-10-CM | POA: Diagnosis not present

## 2021-12-03 DIAGNOSIS — M25662 Stiffness of left knee, not elsewhere classified: Secondary | ICD-10-CM | POA: Diagnosis not present

## 2021-12-04 ENCOUNTER — Ambulatory Visit: Payer: Medicare Other | Admitting: Nurse Practitioner

## 2021-12-11 DIAGNOSIS — M25662 Stiffness of left knee, not elsewhere classified: Secondary | ICD-10-CM | POA: Diagnosis not present

## 2021-12-11 DIAGNOSIS — M25562 Pain in left knee: Secondary | ICD-10-CM | POA: Diagnosis not present

## 2021-12-17 ENCOUNTER — Other Ambulatory Visit: Payer: Self-pay | Admitting: Nurse Practitioner

## 2021-12-17 ENCOUNTER — Other Ambulatory Visit: Payer: Self-pay | Admitting: Internal Medicine

## 2021-12-17 DIAGNOSIS — G8929 Other chronic pain: Secondary | ICD-10-CM

## 2021-12-17 MED ORDER — TRAMADOL HCL 50 MG PO TABS: 50.0000 mg | ORAL_TABLET | Freq: Two times a day (BID) | ORAL | 0 refills | Status: DC | PRN

## 2021-12-18 ENCOUNTER — Telehealth: Payer: Self-pay

## 2021-12-18 NOTE — Telephone Encounter (Signed)
Lvm to scheduled follow up appointment-Toni ?

## 2021-12-31 ENCOUNTER — Encounter: Payer: Self-pay | Admitting: Nurse Practitioner

## 2021-12-31 ENCOUNTER — Ambulatory Visit (INDEPENDENT_AMBULATORY_CARE_PROVIDER_SITE_OTHER): Payer: Medicare Other | Admitting: Nurse Practitioner

## 2021-12-31 VITALS — BP 108/80 | HR 87 | Temp 97.9°F | Resp 16 | Ht 72.0 in | Wt 206.6 lb

## 2021-12-31 DIAGNOSIS — I700 Atherosclerosis of aorta: Secondary | ICD-10-CM | POA: Diagnosis not present

## 2021-12-31 DIAGNOSIS — M25562 Pain in left knee: Secondary | ICD-10-CM | POA: Diagnosis not present

## 2021-12-31 DIAGNOSIS — G8929 Other chronic pain: Secondary | ICD-10-CM | POA: Diagnosis not present

## 2021-12-31 DIAGNOSIS — F320 Major depressive disorder, single episode, mild: Secondary | ICD-10-CM

## 2021-12-31 DIAGNOSIS — F17219 Nicotine dependence, cigarettes, with unspecified nicotine-induced disorders: Secondary | ICD-10-CM

## 2021-12-31 DIAGNOSIS — G479 Sleep disorder, unspecified: Secondary | ICD-10-CM | POA: Diagnosis not present

## 2021-12-31 NOTE — Progress Notes (Signed)
[facility] ?[address] ?[place], [place] [address] ? ?Internal MEDICINE  ?Office Visit Note ? ?Patient Name: [name] [name] ? [id number]  ?[id number] ? ?Date of Service: 12/31/2021 ? ?Chief Complaint  ?Patient presents with  ? Follow-up  ? ? ?HPI ?[name] [name] presents for a follow up visit for hyperlipidemia, smoking cessation, and sleep disturbance. He recently had a left knee replacement surgery done by Dr. Harlow Mares with [facility]. He is still having PT sessions and is doing well. He reports that it is sore but recovering.  ?His BP is normal. He has been taking Chantix to help him quit smoking. He is down to 8-10 cigarettes per day or less.  ?He reports that he is not sleeping well but this is not related to the abnormal dreams that can occur as a side effect of Chantix. He would like to try medication to help him sleep. He has not tried anything specifically OTC or prescription to help him sleep.  ? ? ?Current Medication: ?Outpatient Encounter Medications as of 12/31/2021  ?Medication Sig  ? albuterol (VENTOLIN HFA) 108 (90 Base) MCG/ACT inhaler Inhale 2 puffs into the lungs every 6 (six) hours as needed for wheezing or shortness of breath.  ? allopurinol (ZYLOPRIM) 100 MG tablet Take 2 tab po daily  ? aspirin EC 81 MG EC tablet Take 1 tablet (81 mg total) by mouth daily. Swallow whole.  ? atorvastatin (LIPITOR) 80 MG tablet Take 1 tablet (80 mg total) by mouth daily.  ? buPROPion (WELLBUTRIN SR) 150 MG 12 hr tablet Take 1 tablet (150 mg total) by mouth 2 (two) times daily.  ? clopidogrel (PLAVIX) 75 MG tablet TAKE 1 TABLET(75 MG) BY MOUTH DAILY WITH BREAKFAST  ? DULoxetine (CYMBALTA) 30 MG capsule TAKE 1 CAPSULE(30 MG) BY MOUTH DAILY  ? Fluticasone-Umeclidin-Vilant (TRELEGY ELLIPTA) 100-62.5-25 MCG/INH AEPB Inhale 1 puff into the lungs daily.  ? meloxicam (MOBIC) 7.5 MG tablet TAKE 1 TABLET(7.5 MG) BY MOUTH DAILY  ? phenazopyridine (PYRIDIUM) 200 MG tablet Take 1 tablet (200 mg total) by mouth 3 (three)  times daily as needed for pain.  ? sildenafil (VIAGRA) 100 MG tablet Take 1 tablet (100 mg total) by mouth daily as needed for erectile dysfunction.  ? tamsulosin (FLOMAX) 0.4 MG CAPS capsule Take 1 capsule (0.4 mg total) by mouth daily.  ? tolterodine (DETROL LA) 4 MG 24 hr capsule Take 1 capsule (4 mg total) by mouth daily.  ? traMADol (ULTRAM) 50 MG tablet Take 1 tablet (50 mg total) by mouth every 12 (twelve) hours as needed for moderate pain or severe pain.  ? varenicline (CHANTIX) 1 MG tablet TAKE 1 TABLET(1 MG) BY MOUTH TWICE DAILY  ? ?No facility-administered encounter medications on file as of 12/31/2021.  ? ? ?Surgical History: ?Past Surgical History:  ?Procedure Laterality Date  ? CARDIAC CATHETERIZATION    ? COLONOSCOPY WITH PROPOFOL N/A 04/07/2021  ? Procedure: COLONOSCOPY WITH PROPOFOL;  Surgeon: Virgel Manifold, MD;  Location: [facility];  Service: Endoscopy;  Laterality: N/A;  ? LEFT HEART CATH AND CORONARY ANGIOGRAPHY N/A 05/18/2021  ? Procedure: LEFT HEART CATH AND CORONARY ANGIOGRAPHY;  Surgeon: Wellington Hampshire, MD;  Location: [facility];  Service: Cardiovascular;  Laterality: N/A;  ? 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 ? ?Medical History: ?Past Medical History:  ?Diagnosis Date  ? Arthritis   ? COPD (chronic obstructive pulmonary disease) ([place])   ? Depression   ? Emphysema of lung ([place])   ? Gout   ?  left knee  ? Heart attack ([place])   ? ? ?Family History: ?Family History  ?Problem Relation Age of Onset  ? Alcohol abuse Mother   ? Alcohol abuse Father   ? Alcohol abuse Brother   ? ? ?Social History  ? ?Socioeconomic History  ? Marital status: Married  ?  Spouse name: Not on file  ? Number of children: Not on file  ? Years of education: Not on file  ? Highest education level: Not on file  ?Occupational History  ? Not on file  ?Tobacco Use  ? Smoking status: Every Day  ?  Packs/day: 2.00  ?  Years: 52.00  ?  Pack years: 104.00  ?  Types: Cigarettes  ? Smokeless tobacco: Never   ? Tobacco comments:  ?  10 a day currently  ?Vaping Use  ? Vaping Use: Never used  ?Substance and Sexual Activity  ? Alcohol use: Not Currently  ? Drug use: Never  ? Sexual activity: Not on file  ?Other Topics Concern  ? Not on file  ?Social History Narrative  ? Not on file  ? ?Social Determinants of Health  ? ?Financial Resource Strain: Low Risk   ? Difficulty of Paying Living Expenses: Not hard at all  ?Food Insecurity: Not on file  ?Transportation Needs: Not on file  ?Physical Activity: Not on file  ?Stress: Not on file  ?Social Connections: Not on file  ?Intimate Partner Violence: Not on file  ? ? ? ? ?Review of Systems  ?Constitutional:  Negative for chills, fatigue and unexpected weight change.  ?HENT:  Negative for congestion, rhinorrhea, sneezing and sore throat.   ?Eyes:  Negative for redness.  ?Respiratory:  Negative for cough, chest tightness and shortness of breath.   ?Cardiovascular:  Negative for chest pain and palpitations.  ?Gastrointestinal:  Negative for abdominal pain, constipation, diarrhea, nausea and vomiting.  ?Genitourinary:  Negative for dysuria and frequency.  ?Musculoskeletal:  Positive for arthralgias. Negative for back pain, joint swelling and neck pain.  ?Skin:  Negative for rash.  ?Neurological: Negative.  Negative for tremors and numbness.  ?Hematological:  Negative for adenopathy. Does not bruise/bleed easily.  ?Psychiatric/Behavioral:  Positive for behavioral problems (Depression) and sleep disturbance. Negative for suicidal ideas. The patient is nervous/anxious.   ? ?Vital Signs: ?BP 108/80   Pulse 87   Temp 97.9 ?F (36.6 ?C)   Resp 16   Ht 6' (1.829 m)   Wt 206 lb 9.6 oz (93.7 kg)   SpO2 98%   BMI 28.02 kg/m?  ? ? ?Physical Exam ?Vitals reviewed.  ?Constitutional:   ?   General: He is not in acute distress. ?   Appearance: Normal appearance. He is not ill-appearing.  ?HENT:  ?   Head: Normocephalic and atraumatic.  ?Eyes:  ?   Pupils: Pupils are equal, round, and reactive  to light.  ?Cardiovascular:  ?   Rate and Rhythm: Normal rate and regular rhythm.  ?Pulmonary:  ?   Effort: Pulmonary effort is normal. No respiratory distress.  ?Neurological:  ?   Mental Status: He is alert and oriented to person, place, and time.  ?Psychiatric:     ?   Mood and Affect: Mood normal.     ?   Behavior: Behavior normal.  ? ? ? ? ? ?Assessment/Plan: ?1. Sleep disturbance ?Has not tried any medications before. Instructed patient to take OTC melatonin with doxylamine if the melatonin is not enough. If the OTC medications are not helping, we  will discuss starting prescription medication to help him sleep.  ? ?2. Chronic pain of left knee ?Recovering from left knee replacement surgery.  ? ?3. Aortic atherosclerosis ([place]) ?On statin therapy  ? ?4. Cigarette nicotine dependence with nicotine-induced disorder ?Taking chantix and still decreasing the number of cigarettes he is smoking per day ? ?5. Depression, major, single episode, mild ([place]) ?Stable, no issues.  ? ? ?General Counseling: [place]on verbalizes understanding of the findings of todays visit and agrees with plan of treatment. I have discussed any further diagnostic evaluation that may be needed or ordered today. We also reviewed his medications today. he has been encouraged to call the office with any questions or concerns that should arise related to todays visit. ? ? ? ?No orders of the defined types were placed in this encounter. ? ? ?No orders of the defined types were placed in this encounter. ? ? ?Return in about 3 months (around 04/01/2022) for F/U, Derian Dimalanta PCP. ? ? ?Total time spent:30 Minutes ?Time spent includes review of chart, medications, test results, and follow up plan with the patient.  ? ?[place] Controlled Substance Database was reviewed by me. ? ?This patient was seen by Jonetta Osgood, FNP-C in collaboration with Dr. Clayborn Bigness as a part of collaborative care agreement. ? ? ?Dunya Meiners R. Valetta Fuller, MSN, FNP-C ?Internal medicine  ?

## 2022-01-05 DIAGNOSIS — Z96652 Presence of left artificial knee joint: Secondary | ICD-10-CM | POA: Diagnosis not present

## 2022-01-06 DIAGNOSIS — M25662 Stiffness of left knee, not elsewhere classified: Secondary | ICD-10-CM | POA: Diagnosis not present

## 2022-01-06 DIAGNOSIS — M25562 Pain in left knee: Secondary | ICD-10-CM | POA: Diagnosis not present

## 2022-01-08 DIAGNOSIS — M25562 Pain in left knee: Secondary | ICD-10-CM | POA: Diagnosis not present

## 2022-01-08 DIAGNOSIS — M25662 Stiffness of left knee, not elsewhere classified: Secondary | ICD-10-CM | POA: Diagnosis not present

## 2022-01-10 ENCOUNTER — Other Ambulatory Visit: Payer: Self-pay | Admitting: Nurse Practitioner

## 2022-01-10 DIAGNOSIS — N401 Enlarged prostate with lower urinary tract symptoms: Secondary | ICD-10-CM

## 2022-01-12 DIAGNOSIS — M25562 Pain in left knee: Secondary | ICD-10-CM | POA: Diagnosis not present

## 2022-01-12 DIAGNOSIS — M25662 Stiffness of left knee, not elsewhere classified: Secondary | ICD-10-CM | POA: Diagnosis not present

## 2022-01-15 DIAGNOSIS — M25562 Pain in left knee: Secondary | ICD-10-CM | POA: Diagnosis not present

## 2022-01-15 DIAGNOSIS — M25662 Stiffness of left knee, not elsewhere classified: Secondary | ICD-10-CM | POA: Diagnosis not present

## 2022-01-19 DIAGNOSIS — M25662 Stiffness of left knee, not elsewhere classified: Secondary | ICD-10-CM | POA: Diagnosis not present

## 2022-01-19 DIAGNOSIS — M25562 Pain in left knee: Secondary | ICD-10-CM | POA: Diagnosis not present

## 2022-01-22 DIAGNOSIS — M25562 Pain in left knee: Secondary | ICD-10-CM | POA: Diagnosis not present

## 2022-01-22 DIAGNOSIS — M25662 Stiffness of left knee, not elsewhere classified: Secondary | ICD-10-CM | POA: Diagnosis not present

## 2022-01-27 ENCOUNTER — Ambulatory Visit (INDEPENDENT_AMBULATORY_CARE_PROVIDER_SITE_OTHER): Payer: Medicare Other | Admitting: Nurse Practitioner

## 2022-01-27 ENCOUNTER — Encounter: Payer: Self-pay | Admitting: Nurse Practitioner

## 2022-01-27 VITALS — BP 106/72 | HR 67 | Temp 97.9°F | Resp 16 | Ht 72.0 in | Wt 204.2 lb

## 2022-01-27 DIAGNOSIS — N390 Urinary tract infection, site not specified: Secondary | ICD-10-CM | POA: Diagnosis not present

## 2022-01-27 DIAGNOSIS — H5213 Myopia, bilateral: Secondary | ICD-10-CM | POA: Diagnosis not present

## 2022-01-27 DIAGNOSIS — R300 Dysuria: Secondary | ICD-10-CM

## 2022-01-27 DIAGNOSIS — R319 Hematuria, unspecified: Secondary | ICD-10-CM | POA: Diagnosis not present

## 2022-01-27 LAB — POCT URINALYSIS DIPSTICK
Bilirubin, UA: NEGATIVE
Glucose, UA: NEGATIVE
Ketones, UA: NEGATIVE
Nitrite, UA: NEGATIVE
Protein, UA: NEGATIVE
Spec Grav, UA: >=1.03 — AB (ref 1.010–1.025)
Urobilinogen, UA: 2 E.U./dL — AB
pH, UA: 6 (ref 5.0–8.0)

## 2022-01-27 MED ORDER — SULFAMETHOXAZOLE-TRIMETHOPRIM 800-160 MG PO TABS: 1.0000 | ORAL_TABLET | Freq: Two times a day (BID) | ORAL | 0 refills | Status: AC

## 2022-01-27 MED ORDER — HYDROCODONE-ACETAMINOPHEN 5-325 MG PO TABS: 1.0000 | ORAL_TABLET | Freq: Four times a day (QID) | ORAL | 0 refills | Status: DC | PRN

## 2022-01-27 NOTE — Progress Notes (Signed)
[facility] [place], [place] [address]  Internal MEDICINE  Office Visit Note  Patient Name: [name] [name]  [id number]  [id number]  Date of Service: 01/27/2022  Chief Complaint  Patient presents with   right flank pain    Going on for a week. Off and on pain     HPI Itzel presents for an acute sick visit for right flank pain and additional urinary symptoms.  He reports having right flank pain that has been off and on x1 week.  He reports having urinary frequency and urgency.  He denies any pelvic pain, pain with urination.  He denies any previous history of kidney stones.  A urinalysis was performed in office and there was 2+ blood in the urine and mild to moderate amount of leukocytes.   He denies any fever, chills, fatigue, body aches, decreased appetite, generalized weakness or nausea or vomiting.   Current Medication:  Outpatient Encounter Medications as of 01/27/2022  Medication Sig   albuterol (VENTOLIN HFA) 108 (90 Base) MCG/ACT inhaler Inhale 2 puffs into the lungs every 6 (six) hours as needed for wheezing or shortness of breath.   aspirin EC 81 MG EC tablet Take 1 tablet (81 mg total) by mouth daily. Swallow whole.   Fluticasone-Umeclidin-Vilant (TRELEGY ELLIPTA) 100-62.5-25 MCG/INH AEPB Inhale 1 puff into the lungs daily.   meloxicam (MOBIC) 7.5 MG tablet TAKE 1 TABLET(7.5 MG) BY MOUTH DAILY   phenazopyridine (PYRIDIUM) 200 MG tablet Take 1 tablet (200 mg total) by mouth 3 (three) times daily as needed for pain.   sildenafil (VIAGRA) 100 MG tablet Take 1 tablet (100 mg total) by mouth daily as needed for erectile dysfunction.   [EXPIRED] sulfamethoxazole-trimethoprim (BACTRIM DS) 800-160 MG tablet Take 1 tablet by mouth 2 (two) times daily for 7 days.   tamsulosin (FLOMAX) 0.4 MG CAPS capsule Take 1 capsule (0.4 mg total) by mouth daily.   tolterodine (DETROL LA) 4 MG 24 hr capsule TAKE 1 CAPSULE(4 MG) BY MOUTH DAILY   traMADol (ULTRAM) 50 MG  tablet Take 1 tablet (50 mg total) by mouth every 12 (twelve) hours as needed for moderate pain or severe pain.   [DISCONTINUED] allopurinol (ZYLOPRIM) 100 MG tablet Take 2 tab po daily   [DISCONTINUED] buPROPion (WELLBUTRIN SR) 150 MG 12 hr tablet Take 1 tablet (150 mg total) by mouth 2 (two) times daily.   [DISCONTINUED] clopidogrel (PLAVIX) 75 MG tablet TAKE 1 TABLET(75 MG) BY MOUTH DAILY WITH BREAKFAST   [DISCONTINUED] DULoxetine (CYMBALTA) 30 MG capsule TAKE 1 CAPSULE(30 MG) BY MOUTH DAILY   [DISCONTINUED] HYDROcodone-acetaminophen (NORCO/VICODIN) 5-325 MG tablet Take 1 tablet by mouth every 6 (six) hours as needed for moderate pain.   [DISCONTINUED] varenicline (CHANTIX) 1 MG tablet TAKE 1 TABLET(1 MG) BY MOUTH TWICE DAILY   atorvastatin (LIPITOR) 80 MG tablet Take 1 tablet (80 mg total) by mouth daily.   No facility-administered encounter medications on file as of 01/27/2022.      Medical History: Past Medical History:  Diagnosis Date   Arthritis    COPD (chronic obstructive pulmonary disease) ([place])    Depression    Emphysema of lung ([facility])    Gout    left knee   Heart attack ([facility])      Vital Signs: BP 106/72   Pulse 67   Temp 97.9 F (36.6 C)   Resp 16   Ht 6' (1.829 m)   Wt 204 lb 3.2 oz (92.6 kg)   SpO2 97%  BMI 27.69 kg/m    Review of Systems  Constitutional: Negative.  Negative for appetite change, chills, diaphoresis, fatigue and fever.  Respiratory: Negative.  Negative for cough, chest tightness, shortness of breath and wheezing.   Cardiovascular: Negative.  Negative for chest pain and palpitations.  Gastrointestinal: Negative.  Negative for abdominal pain, constipation, diarrhea, nausea and vomiting.  Genitourinary:  Positive for dysuria, flank pain, frequency, hematuria and urgency. Negative for decreased urine volume, difficulty urinating, enuresis, genital sores, penile discharge, penile pain, penile swelling, scrotal swelling and testicular pain.   Musculoskeletal:  Negative for arthralgias and myalgias.  Neurological:  Negative for weakness and headaches.    Physical Exam Constitutional:      General: He is not in acute distress.    Appearance: Normal appearance. He is not ill-appearing.  HENT:     Head: Normocephalic and atraumatic.  Eyes:     Pupils: Pupils are equal, round, and reactive to light.  Cardiovascular:     Rate and Rhythm: Normal rate and regular rhythm.  Pulmonary:     Effort: Pulmonary effort is normal. No respiratory distress.  Abdominal:     Tenderness: There is no abdominal tenderness. There is no right CVA tenderness or left CVA tenderness.  Neurological:     Mental Status: He is alert and oriented to person, place, and time.  Psychiatric:        Mood and Affect: Mood normal.        Behavior: Behavior normal.       Assessment/Plan: 1. Urinary tract infection with hematuria, site unspecified Bactrim prescribed for empiric treatment of possible UTI.  With in office urinalysis being positive for blood and leukocytes, will initially treat for UTI.  If symptoms persist or involve, will continue further evaluation into possible kidney stones.  At this time his presenting symptoms and urinalysis findings are more consistent with a UTI versus a kidney stone.  Patient instructed to call the clinic or come into the office if his symptoms do not improve or worsen or he develops additional symptoms despite treatment with antibiotic.  Urine has been sent for culture - sulfamethoxazole-trimethoprim (BACTRIM DS) 800-160 MG tablet; Take 1 tablet by mouth 2 (two) times daily for 7 days.  Dispense: 14 tablet; Refill: 0  2. Dysuria Urinalysis positive for a possible UTI, urine culture sent to the lab. - POCT Urinalysis Dipstick - CULTURE, URINE COMPREHENSIVE    General Counseling: [place] verbalizes understanding of the findings of todays visit and agrees with plan of treatment. I have discussed any further diagnostic  evaluation that may be needed or ordered today. We also reviewed his medications today. he has been encouraged to call the office with any questions or concerns that should arise related to todays visit.    Counseling:    Orders Placed This Encounter  Procedures   CULTURE, URINE COMPREHENSIVE   POCT Urinalysis Dipstick    Meds ordered this encounter  Medications   sulfamethoxazole-trimethoprim (BACTRIM DS) 800-160 MG tablet    Sig: Take 1 tablet by mouth 2 (two) times daily for 7 days.    Dispense:  14 tablet    Refill:  0   DISCONTD: HYDROcodone-acetaminophen (NORCO/VICODIN) 5-325 MG tablet    Sig: Take 1 tablet by mouth every 6 (six) hours as needed for moderate pain.    Dispense:  30 tablet    Refill:  0    Return if symptoms worsen or fail to improve.   Controlled Substance Database was reviewed by  me for overdose risk score (ORS)  Time spent:30 Minutes Time spent with patient included reviewing progress notes, labs, imaging studies, and discussing plan for follow up.   This patient was seen by Jonetta Osgood, FNP-C in collaboration with Dr. Clayborn Bigness as a part of collaborative care agreement.  Amiri Tritch R. Valetta Fuller, MSN, FNP-C Internal Medicine

## 2022-01-29 DIAGNOSIS — M25562 Pain in left knee: Secondary | ICD-10-CM | POA: Diagnosis not present

## 2022-01-29 DIAGNOSIS — M25662 Stiffness of left knee, not elsewhere classified: Secondary | ICD-10-CM | POA: Diagnosis not present

## 2022-01-31 LAB — CULTURE, URINE COMPREHENSIVE

## 2022-02-01 DIAGNOSIS — M25562 Pain in left knee: Secondary | ICD-10-CM | POA: Diagnosis not present

## 2022-02-01 DIAGNOSIS — M25662 Stiffness of left knee, not elsewhere classified: Secondary | ICD-10-CM | POA: Diagnosis not present

## 2022-02-04 ENCOUNTER — Other Ambulatory Visit: Payer: Self-pay | Admitting: Nurse Practitioner

## 2022-02-04 DIAGNOSIS — F320 Major depressive disorder, single episode, mild: Secondary | ICD-10-CM

## 2022-02-05 ENCOUNTER — Other Ambulatory Visit: Payer: Self-pay | Admitting: Nurse Practitioner

## 2022-02-05 DIAGNOSIS — M25662 Stiffness of left knee, not elsewhere classified: Secondary | ICD-10-CM | POA: Diagnosis not present

## 2022-02-05 DIAGNOSIS — M25562 Pain in left knee: Secondary | ICD-10-CM | POA: Diagnosis not present

## 2022-02-05 MED ORDER — HYDROCODONE-ACETAMINOPHEN 5-325 MG PO TABS: 1.0000 | ORAL_TABLET | Freq: Four times a day (QID) | ORAL | 0 refills | Status: DC | PRN

## 2022-02-10 DIAGNOSIS — M25562 Pain in left knee: Secondary | ICD-10-CM | POA: Diagnosis not present

## 2022-02-10 DIAGNOSIS — M25662 Stiffness of left knee, not elsewhere classified: Secondary | ICD-10-CM | POA: Diagnosis not present

## 2022-02-16 ENCOUNTER — Telehealth: Payer: Self-pay

## 2022-02-16 DIAGNOSIS — M25562 Pain in left knee: Secondary | ICD-10-CM | POA: Diagnosis not present

## 2022-02-16 DIAGNOSIS — M25662 Stiffness of left knee, not elsewhere classified: Secondary | ICD-10-CM | POA: Diagnosis not present

## 2022-02-18 ENCOUNTER — Other Ambulatory Visit: Payer: Self-pay | Admitting: Nurse Practitioner

## 2022-02-18 DIAGNOSIS — F17219 Nicotine dependence, cigarettes, with unspecified nicotine-induced disorders: Secondary | ICD-10-CM

## 2022-02-18 DIAGNOSIS — F320 Major depressive disorder, single episode, mild: Secondary | ICD-10-CM

## 2022-02-19 ENCOUNTER — Other Ambulatory Visit: Payer: Self-pay | Admitting: Nurse Practitioner

## 2022-02-19 DIAGNOSIS — G8929 Other chronic pain: Secondary | ICD-10-CM

## 2022-02-19 DIAGNOSIS — R1031 Right lower quadrant pain: Secondary | ICD-10-CM

## 2022-02-19 DIAGNOSIS — F17219 Nicotine dependence, cigarettes, with unspecified nicotine-induced disorders: Secondary | ICD-10-CM

## 2022-02-19 MED ORDER — HYDROCODONE-ACETAMINOPHEN 5-325 MG PO TABS: 1.0000 | ORAL_TABLET | Freq: Four times a day (QID) | ORAL | 0 refills | Status: DC | PRN

## 2022-02-19 NOTE — Telephone Encounter (Signed)
Pt should not be on long term chantix, or can lower dose

## 2022-02-19 NOTE — Telephone Encounter (Signed)
error 

## 2022-02-22 ENCOUNTER — Other Ambulatory Visit: Payer: Self-pay

## 2022-02-22 DIAGNOSIS — S8001XA Contusion of right knee, initial encounter: Secondary | ICD-10-CM | POA: Diagnosis not present

## 2022-02-22 DIAGNOSIS — S8002XA Contusion of left knee, initial encounter: Secondary | ICD-10-CM | POA: Diagnosis not present

## 2022-02-22 DIAGNOSIS — Z96652 Presence of left artificial knee joint: Secondary | ICD-10-CM | POA: Diagnosis not present

## 2022-02-22 MED ORDER — VARENICLINE TARTRATE 0.5 MG PO TABS: 0.5000 mg | ORAL_TABLET | Freq: Two times a day (BID) | ORAL | 0 refills | Status: DC

## 2022-02-26 ENCOUNTER — Telehealth: Payer: Self-pay

## 2022-02-26 NOTE — Telephone Encounter (Signed)
Ct abdomen scheduled. Notified patient of appointment date, time, npo and location. Instructed patient to pick up prep before exam-Toni

## 2022-03-02 ENCOUNTER — Other Ambulatory Visit: Payer: Self-pay | Admitting: Nurse Practitioner

## 2022-03-02 DIAGNOSIS — Z8739 Personal history of other diseases of the musculoskeletal system and connective tissue: Secondary | ICD-10-CM

## 2022-03-02 DIAGNOSIS — S8002XA Contusion of left knee, initial encounter: Secondary | ICD-10-CM | POA: Diagnosis not present

## 2022-03-02 DIAGNOSIS — S8001XA Contusion of right knee, initial encounter: Secondary | ICD-10-CM | POA: Diagnosis not present

## 2022-03-02 DIAGNOSIS — Z96652 Presence of left artificial knee joint: Secondary | ICD-10-CM | POA: Diagnosis not present

## 2022-03-05 ENCOUNTER — Ambulatory Visit
Admission: RE | Admit: 2022-03-05 | Discharge: 2022-03-05 | Disposition: A | Discharge status: Home / Self Care | Payer: Medicare Other | Source: Ambulatory Visit | Attending: Nurse Practitioner | Admitting: Nurse Practitioner

## 2022-03-05 DIAGNOSIS — G8929 Other chronic pain: Secondary | ICD-10-CM | POA: Insufficient documentation

## 2022-03-05 DIAGNOSIS — R109 Unspecified abdominal pain: Secondary | ICD-10-CM | POA: Insufficient documentation

## 2022-03-05 DIAGNOSIS — R1031 Right lower quadrant pain: Secondary | ICD-10-CM | POA: Insufficient documentation

## 2022-03-05 DIAGNOSIS — N200 Calculus of kidney: Secondary | ICD-10-CM | POA: Diagnosis not present

## 2022-03-09 ENCOUNTER — Other Ambulatory Visit: Payer: Self-pay | Admitting: Nurse Practitioner

## 2022-03-10 ENCOUNTER — Ambulatory Visit (INDEPENDENT_AMBULATORY_CARE_PROVIDER_SITE_OTHER): Payer: Medicare Other | Admitting: Nurse Practitioner

## 2022-03-10 ENCOUNTER — Encounter: Payer: Self-pay | Admitting: Nurse Practitioner

## 2022-03-10 VITALS — BP 118/71 | HR 74 | Temp 98.2°F | Resp 16 | Ht 72.0 in | Wt 203.0 lb

## 2022-03-10 DIAGNOSIS — N200 Calculus of kidney: Secondary | ICD-10-CM

## 2022-03-10 DIAGNOSIS — N289 Disorder of kidney and ureter, unspecified: Secondary | ICD-10-CM | POA: Diagnosis not present

## 2022-03-10 DIAGNOSIS — W57XXXA Bitten or stung by nonvenomous insect and other nonvenomous arthropods, initial encounter: Secondary | ICD-10-CM

## 2022-03-10 MED ORDER — HYDROCODONE-ACETAMINOPHEN 10-325 MG PO TABS: 1.0000 | ORAL_TABLET | Freq: Four times a day (QID) | ORAL | 0 refills | Status: AC | PRN

## 2022-03-10 MED ORDER — TRIAMCINOLONE ACETONIDE 0.5 % EX OINT: 1.0000 | TOPICAL_OINTMENT | Freq: Two times a day (BID) | CUTANEOUS | 3 refills | Status: DC

## 2022-03-10 NOTE — Progress Notes (Signed)
[facility] [place], [place] [address]  Internal MEDICINE  Office Visit Note  Patient Name: [name] [name]  [id number]  [id number]  Date of Service: 03/10/2022  Chief Complaint  Patient presents with   Acute Visit    Bug bites all over body, back, arms, side of chest, red itchy, noticed them a couple days ago     HPI [name] [name] presents for an acute sick visit for bug bites scattered over torso and extremities.  It is likely that these are flea bites which is what the patient thinks that they are.  They are small red itchy with a center that looks like a bug bite. CT of the abdomen showed small bilateral kidney stones and a left renal cyst, patient is requesting a urology consult    Current Medication:  Outpatient Encounter Medications as of 03/10/2022  Medication Sig   albuterol (VENTOLIN HFA) 108 (90 Base) MCG/ACT inhaler Inhale 2 puffs into the lungs every 6 (six) hours as needed for wheezing or shortness of breath.   allopurinol (ZYLOPRIM) 100 MG tablet TAKE 2 TABLETS BY MOUTH DAILY   aspirin EC 81 MG EC tablet Take 1 tablet (81 mg total) by mouth daily. Swallow whole.   buPROPion (WELLBUTRIN SR) 150 MG 12 hr tablet TAKE 1 TABLET(150 MG) BY MOUTH TWICE DAILY   clopidogrel (PLAVIX) 75 MG tablet TAKE 1 TABLET(75 MG) BY MOUTH DAILY WITH BREAKFAST   DULoxetine (CYMBALTA) 30 MG capsule TAKE 1 CAPSULE(30 MG) BY MOUTH DAILY   Fluticasone-Umeclidin-Vilant (TRELEGY ELLIPTA) 100-62.5-25 MCG/INH AEPB Inhale 1 puff into the lungs daily.   HYDROcodone-acetaminophen (NORCO) 10-325 MG tablet Take 1 tablet by mouth every 6 (six) hours as needed for up to 5 days for severe pain.   meloxicam (MOBIC) 7.5 MG tablet TAKE 1 TABLET(7.5 MG) BY MOUTH DAILY   phenazopyridine (PYRIDIUM) 200 MG tablet Take 1 tablet (200 mg total) by mouth 3 (three) times daily as needed for pain.   sildenafil (VIAGRA) 100 MG tablet Take 1 tablet (100 mg total) by mouth daily as needed for erectile  dysfunction.   tamsulosin (FLOMAX) 0.4 MG CAPS capsule Take 1 capsule (0.4 mg total) by mouth daily.   tolterodine (DETROL LA) 4 MG 24 hr capsule TAKE 1 CAPSULE(4 MG) BY MOUTH DAILY   traMADol (ULTRAM) 50 MG tablet Take 1 tablet (50 mg total) by mouth every 12 (twelve) hours as needed for moderate pain or severe pain.   triamcinolone ointment (KENALOG) 0.5 % Apply 1 Application topically 2 (two) times daily. To affected area until resolved.   varenicline (CHANTIX) 0.5 MG tablet Take 1 tablet (0.5 mg total) by mouth 2 (two) times daily.   [DISCONTINUED] HYDROcodone-acetaminophen (NORCO/VICODIN) 5-325 MG tablet Take 1 tablet by mouth every 6 (six) hours as needed for moderate pain.   atorvastatin (LIPITOR) 80 MG tablet Take 1 tablet (80 mg total) by mouth daily.   No facility-administered encounter medications on file as of 03/10/2022.      Medical History: Past Medical History:  Diagnosis Date   Arthritis    COPD (chronic obstructive pulmonary disease) ([place])    Depression    Emphysema of lung ([facility])    Gout    left knee   Heart attack ([facility])      Vital Signs: BP 118/71   Pulse 74   Temp 98.2 F (36.8 C)   Resp 16   Ht 6' (1.829 m)   Wt 203 lb (92.1 kg)   SpO2 96%  BMI 27.53 kg/m    Review of Systems  Constitutional: Negative.  Negative for appetite change, chills, diaphoresis, fatigue and fever.  Respiratory: Negative.  Negative for cough, chest tightness, shortness of breath and wheezing.   Cardiovascular: Negative.  Negative for chest pain and palpitations.  Gastrointestinal: Negative.  Negative for abdominal pain, constipation, diarrhea, nausea and vomiting.  Genitourinary:  Positive for dysuria, flank pain, frequency, hematuria and urgency. Negative for decreased urine volume, difficulty urinating, enuresis, genital sores, penile discharge, penile pain, penile swelling, scrotal swelling and testicular pain.  Musculoskeletal:  Negative for arthralgias and myalgias.   Neurological:  Negative for weakness and headaches.    Physical Exam Vitals reviewed.  Constitutional:      General: He is not in acute distress.    Appearance: Normal appearance. He is not ill-appearing.  HENT:     Head: Normocephalic and atraumatic.  Eyes:     Pupils: Pupils are equal, round, and reactive to light.  Cardiovascular:     Rate and Rhythm: Normal rate and regular rhythm.  Pulmonary:     Effort: Pulmonary effort is normal. No respiratory distress.  Abdominal:     Tenderness: There is no abdominal tenderness. There is no right CVA tenderness or left CVA tenderness.  Neurological:     Mental Status: He is alert and oriented to person, place, and time.  Psychiatric:        Mood and Affect: Mood normal.        Behavior: Behavior normal.       Assessment/Plan: 1. Bilateral nephrolithiasis - Ambulatory referral to Urology - HYDROcodone-acetaminophen (NORCO) 10-325 MG tablet; Take 1 tablet by mouth every 6 (six) hours as needed for up to 5 days for severe pain.  Dispense: 20 tablet; Refill: 0  2. Lesion of left native kidney - Ambulatory referral to Urology  3. Flea bite of multiple sites - triamcinolone ointment (KENALOG) 0.5 %; Apply 1 Application topically 2 (two) times daily. To affected area until resolved.  Dispense: 30 g; Refill: 3   General Counseling: [name] verbalizes understanding of the findings of todays visit and agrees with plan of treatment. I have discussed any further diagnostic evaluation that may be needed or ordered today. We also reviewed his medications today. he has been encouraged to call the office with any questions or concerns that should arise related to todays visit.    Counseling:    Orders Placed This Encounter  Procedures   Ambulatory referral to Urology    Meds ordered this encounter  Medications   triamcinolone ointment (KENALOG) 0.5 %    Sig: Apply 1 Application topically 2 (two) times daily. To affected area until  resolved.    Dispense:  30 g    Refill:  3   HYDROcodone-acetaminophen (NORCO) 10-325 MG tablet    Sig: Take 1 tablet by mouth every 6 (six) hours as needed for up to 5 days for severe pain.    Dispense:  20 tablet    Refill:  0    Return if symptoms worsen or fail to improve.  [place] Controlled Substance Database was reviewed by me for overdose risk score (ORS)  Time spent:30 Minutes Time spent with patient included reviewing progress notes, labs, imaging studies, and discussing plan for follow up.   This patient was seen by Jonetta Osgood, FNP-C in collaboration with Dr. Clayborn Bigness as a part of collaborative care agreement.  Klea Nall R. Valetta Fuller, MSN, FNP-C Internal Medicine

## 2022-03-22 ENCOUNTER — Telehealth: Payer: Self-pay

## 2022-03-22 ENCOUNTER — Other Ambulatory Visit: Payer: Self-pay | Admitting: Nurse Practitioner

## 2022-03-22 DIAGNOSIS — G8929 Other chronic pain: Secondary | ICD-10-CM

## 2022-03-22 DIAGNOSIS — R1031 Right lower quadrant pain: Secondary | ICD-10-CM

## 2022-03-22 MED ORDER — HYDROCODONE-ACETAMINOPHEN 5-325 MG PO TABS: 1.0000 | ORAL_TABLET | Freq: Four times a day (QID) | ORAL | 0 refills | Status: DC | PRN

## 2022-03-22 NOTE — Telephone Encounter (Signed)
Alyssa sent med, spoke to pt informed him

## 2022-03-23 ENCOUNTER — Encounter: Payer: Self-pay | Admitting: Nurse Practitioner

## 2022-03-30 ENCOUNTER — Other Ambulatory Visit: Payer: Self-pay | Admitting: Nurse Practitioner

## 2022-03-31 ENCOUNTER — Ambulatory Visit (INDEPENDENT_AMBULATORY_CARE_PROVIDER_SITE_OTHER): Payer: Medicare Other | Admitting: Nurse Practitioner

## 2022-03-31 ENCOUNTER — Encounter: Payer: Self-pay | Admitting: Nurse Practitioner

## 2022-03-31 VITALS — BP 109/67 | HR 83 | Temp 98.5°F | Resp 16 | Ht 72.0 in | Wt 201.0 lb

## 2022-03-31 DIAGNOSIS — N200 Calculus of kidney: Secondary | ICD-10-CM

## 2022-03-31 DIAGNOSIS — R109 Unspecified abdominal pain: Secondary | ICD-10-CM | POA: Diagnosis not present

## 2022-03-31 MED ORDER — OXYCODONE-ACETAMINOPHEN 7.5-325 MG PO TABS: 1.0000 | ORAL_TABLET | ORAL | 0 refills | Status: DC | PRN

## 2022-03-31 NOTE — Progress Notes (Signed)
[facility] [place], [place] [address]  Internal MEDICINE  Office Visit Note  Patient Name: [name] [name]  [id number]  [id number]  Date of Service: 03/31/2022  Chief Complaint  Patient presents with   Follow-up   Depression   COPD    HPI Sora presents for a follow up visit regarding the bilateral kidney stones. He is waiting to hear back from [facility] urology to see if the procedure to break up the stones can be done at [facility] instead of [facility]. He was previously prescribed hydrocodone-acetaminophen for flank pain related to the kidney stones but this did not help the pain unless he took 4 tablets. Patient was then instructed to only take the medication as prescribed and that a different medication can be prescribed that may be more effective.  Oxycodone for kindey stones, waiting for procedure.    Current Medication: Outpatient Encounter Medications as of 03/31/2022  Medication Sig   albuterol (VENTOLIN HFA) 108 (90 Base) MCG/ACT inhaler Inhale 2 puffs into the lungs every 6 (six) hours as needed for wheezing or shortness of breath.   allopurinol (ZYLOPRIM) 100 MG tablet TAKE 2 TABLETS BY MOUTH DAILY   aspirin EC 81 MG EC tablet Take 1 tablet (81 mg total) by mouth daily. Swallow whole.   buPROPion (WELLBUTRIN SR) 150 MG 12 hr tablet TAKE 1 TABLET(150 MG) BY MOUTH TWICE DAILY   clopidogrel (PLAVIX) 75 MG tablet TAKE 1 TABLET(75 MG) BY MOUTH DAILY WITH BREAKFAST   DULoxetine (CYMBALTA) 30 MG capsule TAKE 1 CAPSULE(30 MG) BY MOUTH DAILY   Fluticasone-Umeclidin-Vilant (TRELEGY ELLIPTA) 100-62.5-25 MCG/INH AEPB Inhale 1 puff into the lungs daily.   meloxicam (MOBIC) 7.5 MG tablet TAKE 1 TABLET(7.5 MG) BY MOUTH DAILY   oxyCODONE-acetaminophen (PERCOCET) 7.5-325 MG tablet Take 1 tablet by mouth every 4 (four) hours as needed for severe pain.   sildenafil (VIAGRA) 100 MG tablet Take 1 tablet (100 mg total) by mouth daily as needed for erectile dysfunction.    tamsulosin (FLOMAX) 0.4 MG CAPS capsule Take 1 capsule (0.4 mg total) by mouth daily.   tolterodine (DETROL LA) 4 MG 24 hr capsule TAKE 1 CAPSULE(4 MG) BY MOUTH DAILY   traMADol (ULTRAM) 50 MG tablet Take 1 tablet (50 mg total) by mouth every 12 (twelve) hours as needed for moderate pain or severe pain.   triamcinolone ointment (KENALOG) 0.5 % Apply 1 Application topically 2 (two) times daily. To affected area until resolved.   varenicline (CHANTIX) 0.5 MG tablet TAKE 1 TABLET(0.5 MG) BY MOUTH TWICE DAILY   [DISCONTINUED] phenazopyridine (PYRIDIUM) 200 MG tablet Take 1 tablet (200 mg total) by mouth 3 (three) times daily as needed for pain.   atorvastatin (LIPITOR) 80 MG tablet Take 1 tablet (80 mg total) by mouth daily.   [DISCONTINUED] HYDROcodone-acetaminophen (NORCO/VICODIN) 5-325 MG tablet Take 1 tablet by mouth every 6 (six) hours as needed for severe pain. (Patient not taking: Reported on 03/31/2022)   No facility-administered encounter medications on file as of 03/31/2022.    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    Medical History: Past Medical History:  Diagnosis Date   Arthritis    COPD (chronic obstructive pulmonary disease) ([place])    Depression    Emphysema of lung (HCC)    Gout    left knee   Heart attack ([place])    Kidney stones    bilateral    Family History: Family History  Problem Relation Age of Onset   Alcohol abuse Mother    Alcohol abuse Father    Alcohol abuse  Brother     Social History   Socioeconomic History   Marital status: Married    Spouse name: Not on file   Number of children: Not on file   Years of education: Not on file   Highest education level: Not on file  Occupational History   Not on file  Tobacco Use   Smoking status: Every Day    Packs/day: 2.00    Years: 52.00    Total pack years: 104.00    Types: Cigarettes   Smokeless tobacco: Never   Tobacco comments:    10 a day currently  Vaping Use   Vaping Use: Never used  Substance and Sexual Activity   Alcohol use: Not Currently   Drug use: Never   Sexual activity: Not on file  Other Topics Concern   Not on file  Social History Narrative   Not on file   Social Determinants of Health   Financial Resource Strain: Low Risk  (08/10/2021)   Overall Financial Resource Strain (CARDIA)    Difficulty of Paying Living Expenses: Not hard at all  Food Insecurity: Not on file  Transportation Needs: Not on file  Physical Activity: Not on file  Stress: Not on file  Social Connections: Not on file  Intimate Partner Violence: Not on file      Review of Systems  Constitutional: Negative.  Negative for appetite change, chills, diaphoresis, fatigue and fever.  Respiratory: Negative.  Negative for cough, chest tightness, shortness of breath and wheezing.   Cardiovascular: Negative.  Negative for chest pain and palpitations.  Gastrointestinal: Negative.  Negative for abdominal pain, constipation, diarrhea, nausea and vomiting.  Genitourinary:  Positive for dysuria, flank pain, frequency, hematuria and urgency. Negative for decreased urine volume, difficulty urinating, enuresis, genital sores, penile discharge, penile pain, penile swelling, scrotal swelling and testicular pain.  Musculoskeletal:  Negative for arthralgias and myalgias.  Neurological:  Negative for weakness and headaches.    Vital Signs: BP 109/67   Pulse 83   Temp 98.5 F (36.9 C)   Resp 16   Ht 6' (1.829  m)   Wt 201 lb (91.2 kg)   SpO2 99%   BMI 27.26 kg/m    Physical Exam Vitals reviewed.  Constitutional:      General: He is not in acute distress.    Appearance: Normal appearance. He is not ill-appearing.  HENT:     Head: Normocephalic and atraumatic.  Eyes:     Pupils: Pupils are equal, round, and reactive to light.  Cardiovascular:     Rate and Rhythm: Normal rate and regular rhythm.  Pulmonary:     Effort: Pulmonary effort is normal. No respiratory distress.  Abdominal:     Tenderness: There is no abdominal tenderness. There is no right CVA tenderness or left CVA tenderness.  Neurological:     Mental Status: He is alert and oriented to person, place, and time.  Psychiatric:  Mood and Affect: Mood normal.        Behavior: Behavior normal.        Assessment/Plan: 1. Bilateral nephrolithiasis Norco discontinued, percocet prescription sent into his pharmacy.  - oxyCODONE-acetaminophen (PERCOCET) 7.5-325 MG tablet; Take 1 tablet by mouth every 4 (four) hours as needed for severe pain.  Dispense: 30 tablet; Refill: 0  2. Acute flank pain See problem #1 - oxyCODONE-acetaminophen (PERCOCET) 7.5-325 MG tablet; Take 1 tablet by mouth every 4 (four) hours as needed for severe pain.  Dispense: 30 tablet; Refill: 0   General Counseling: Ryzen verbalizes understanding of the findings of todays visit and agrees with plan of treatment. I have discussed any further diagnostic evaluation that may be needed or ordered today. We also reviewed his medications today. he has been encouraged to call the office with any questions or concerns that should arise related to todays visit.    No orders of the defined types were placed in this encounter.   Meds ordered this encounter  Medications   oxyCODONE-acetaminophen (PERCOCET) 7.5-325 MG tablet    Sig: Take 1 tablet by mouth every 4 (four) hours as needed for severe pain.    Dispense:  30 tablet    Refill:  0    Return if  symptoms worsen or fail to improve.   Total time spent:20 Minutes Time spent includes review of chart, medications, test results, and follow up plan with the patient.   [place] Controlled Substance Database was reviewed by me.  This patient was seen by Jonetta Osgood, FNP-C in collaboration with Dr. Clayborn Bigness as a part of collaborative care agreement.   Sebastion Jun R. Valetta Fuller, MSN, FNP-C Internal medicine

## 2022-04-08 ENCOUNTER — Other Ambulatory Visit: Payer: Self-pay | Admitting: Internal Medicine

## 2022-04-08 DIAGNOSIS — J449 Chronic obstructive pulmonary disease, unspecified: Secondary | ICD-10-CM

## 2022-04-08 DIAGNOSIS — R0602 Shortness of breath: Secondary | ICD-10-CM

## 2022-04-12 ENCOUNTER — Telehealth: Payer: Self-pay

## 2022-04-14 ENCOUNTER — Other Ambulatory Visit: Payer: Self-pay | Admitting: Nurse Practitioner

## 2022-04-14 ENCOUNTER — Telehealth: Payer: Self-pay

## 2022-04-14 ENCOUNTER — Ambulatory Visit: Payer: Medicare Other | Admitting: Urology

## 2022-04-14 VITALS — BP 105/67 | HR 82 | Ht 72.0 in | Wt 205.0 lb

## 2022-04-14 DIAGNOSIS — R109 Unspecified abdominal pain: Secondary | ICD-10-CM

## 2022-04-14 DIAGNOSIS — N200 Calculus of kidney: Secondary | ICD-10-CM

## 2022-04-14 DIAGNOSIS — N5082 Scrotal pain: Secondary | ICD-10-CM

## 2022-04-14 DIAGNOSIS — C61 Malignant neoplasm of prostate: Secondary | ICD-10-CM | POA: Diagnosis not present

## 2022-04-14 MED ORDER — OXYCODONE-ACETAMINOPHEN 7.5-325 MG PO TABS: 1.0000 | ORAL_TABLET | ORAL | 0 refills | Status: DC | PRN

## 2022-04-14 NOTE — Telephone Encounter (Signed)
error 

## 2022-04-14 NOTE — Progress Notes (Signed)
04/14/2022 1:51 PM   Troye Hiemstra [birth year] [id number]  Referring provider: Jonetta Osgood, NP [place],  [place] [address]  Chief Complaint  Patient presents with   Nephrolithiasis    HPI: 66 y.o. male previous follow-up for prostate cancer.  Presents for nephrolithiasis.  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  States he is continuing to be seen at [facility] for regular PSA and active surveillance Had a CT performed 03/05/2022 for right back pain.  Was noted to have punctate bilateral nonobstructing renal calculi and a small left renal cyst.  Also noted to have degenerative changes with disc base narrowing in the lumbar spine Also complains of burning sensation in right hemiscrotum   PMH: Past Medical History:  Diagnosis Date   Arthritis    COPD (chronic obstructive pulmonary disease) ([place])    Depression    Emphysema of lung (HCC)    Gout    left knee   Heart attack ([place])    Kidney stones    bilateral    Surgical History: Past Surgical History:  Procedure Laterality Date   CARDIAC CATHETERIZATION     COLONOSCOPY WITH PROPOFOL N/A 04/07/2021   Procedure: COLONOSCOPY WITH PROPOFOL;  Surgeon: Virgel Manifold, MD;  Location: [facility] ENDOSCOPY;  Service: Endoscopy;  Laterality: N/A;   LEFT HEART CATH AND CORONARY ANGIOGRAPHY N/A 05/18/2021   Procedure: LEFT HEART CATH AND CORONARY ANGIOGRAPHY;  Surgeon: Wellington Hampshire, MD;  Location: [place] [facility];  Service: Cardiovascular;  Laterality: N/A;   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 If you have any  questions, ask your nurse or doctor.          albuterol 108 (90 Base) MCG/ACT inhaler Commonly known as: VENTOLIN HFA Inhale 2 puffs into the lungs every 6 (six) hours as needed for wheezing or shortness of breath.   allopurinol 100 MG tablet Commonly known as: ZYLOPRIM TAKE 2 TABLETS BY MOUTH DAILY   aspirin EC 81 MG tablet Take 1 tablet (81 mg total) by mouth daily. Swallow whole.   atorvastatin 80 MG tablet Commonly known as: LIPITOR Take 1 tablet (80 mg total) by mouth daily.   buPROPion 150 MG 12 hr tablet Commonly known as: WELLBUTRIN SR TAKE 1 TABLET(150 MG) BY MOUTH TWICE DAILY   clopidogrel 75 MG tablet Commonly known as: PLAVIX TAKE 1 TABLET(75 MG) BY MOUTH DAILY WITH BREAKFAST   DULoxetine 30 MG capsule Commonly known as: CYMBALTA TAKE 1 CAPSULE(30 MG) BY MOUTH DAILY   meloxicam 7.5 MG tablet Commonly known as: MOBIC TAKE 1 TABLET(7.5 MG) BY MOUTH DAILY   oxyCODONE-acetaminophen 7.5-325 MG tablet Commonly known as: PERCOCET Take 1 tablet by mouth every 4 (four) hours as needed for severe pain.   sildenafil 100 MG tablet Commonly known as: VIAGRA Take 1 tablet (100 mg total) by mouth daily as needed for erectile dysfunction.   tamsulosin 0.4 MG Caps capsule Commonly known as: FLOMAX Take 1 capsule (0.4 mg total) by mouth daily.   tolterodine 4 MG 24 hr capsule Commonly known as: DETROL LA TAKE  1 CAPSULE(4 MG) BY MOUTH DAILY   traMADol 50 MG tablet Commonly known as: ULTRAM Take 1 tablet (50 mg total) by mouth every 12 (twelve) hours as needed for moderate pain or severe pain.   Trelegy Ellipta 100-62.5-25 MCG/ACT Aepb Generic drug: Fluticasone-Umeclidin-Vilant TAKE 1 INHALATION INTO THE LUNGS BY MOUTH DAILY   triamcinolone ointment 0.5 % Commonly known as: KENALOG Apply 1 Application topically 2 (two) times daily. To affected area until resolved.   varenicline 0.5 MG tablet Commonly known as: CHANTIX TAKE 1 TABLET(0.5 MG) BY MOUTH TWICE  DAILY        Allergies: No Known Allergies  Family History: Family History  Problem Relation Age of Onset   Alcohol abuse Mother    Alcohol abuse Father    Alcohol abuse Brother     Social History:  reports that he has been smoking cigarettes. He has a 104.00 pack-year smoking history. He has never used smokeless tobacco. He reports that he does not currently use alcohol. He reports that he does not use drugs.   Physical Exam: BP 105/67   Pulse 82   Ht 6' (1.829 m)   Wt 205 lb (93 kg)   BMI 27.80 kg/m   Constitutional:  Alert and oriented, No acute distress. HEENT: [place] AT, moist mucus membranes.  Trachea midline, no masses. Cardiovascular: No clubbing, cyanosis, or edema. Respiratory: Normal respiratory effort, no increased work of breathing. GI: Abdomen is soft, nontender, nondistended, no abdominal masses GU: Right testis palpably normal without tenderness.  No evidence of hernia.  Moderate left hydrocele. Skin: No rashes, bruises or suspicious lesions. Neurologic: Grossly intact, no focal deficits, moving all 4 extremities. Psychiatric: Normal mood and affect.  Laboratory Data:  Urinalysis   Pertinent Imaging: CT images were personally reviewed and interpreted.  He has punctate, bilateral renal calculi which are nonobstructing.    Assessment & Plan:    1.  Bilateral nephrolithiasis  We discussed these calculi would not be a source of his back pain.  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  He indicates the area of his pain today is in the low back region. Would recommend monitoring of his renal calculi and a follow-up KUB/visit in 6 months  2.  Right hemiscrotal pain Unremarkable exam and pain characteristic in line with neuropathic pain which may be related to disc disease Trial gabapentin 200 mg twice daily Recommend PCP follow-up for persistent symptoms and consideration of lumbar MRI  3.  Low risk prostate cancer Continue [facility]  Urology follow-up    Abbie Sons, [place] [address], [place], [place] [address] [phone]

## 2022-04-14 NOTE — Telephone Encounter (Signed)
Pt advised we send his medication

## 2022-04-14 NOTE — Patient Instructions (Signed)
[facility] Instructions [facility]   You will receive a box/kit in the mail that will have a urine jug and instructions in the kit.  When the box arrives you will need to call our office [phone] to schedule a LAB appointment.   You will need to do a 24hour urine and this should be done during the days that our office will be open.  For example any day from Sunday through Thursday.   If you take Vitamin C 161m or greater please stop this 5 days prior to collection.   How to collect the urine sample: On the day you start the urine sample this 1st morning urine should NOT be collected.  For the rest of the day including all night urines should be collected.  On the next morning the 1st urine should be collected and then you will be finished with the urine collections.   You will need to bring the box with you on your LAB appointment day after urine has been collected and all instructions are complete in the box.  Your blood will be drawn and the box will be collected by our Lab employee to be sent off for analysis.   When urine and blood is complete you will need to schedule a follow up appointment for lab results.

## 2022-04-16 ENCOUNTER — Encounter: Payer: Self-pay | Admitting: Urology

## 2022-04-16 MED ORDER — GABAPENTIN 100 MG PO CAPS
200.0000 mg | ORAL_CAPSULE | Freq: Two times a day (BID) | ORAL | 0 refills | Status: DC
Start: 2022-04-16 — End: 2022-11-12

## 2022-05-01 ENCOUNTER — Encounter: Payer: Self-pay | Admitting: Nurse Practitioner

## 2022-05-10 ENCOUNTER — Telehealth: Payer: Self-pay | Admitting: Acute Care

## 2022-05-10 NOTE — Telephone Encounter (Signed)
Attempted to reach pt to schedule for annual LDCT-LVMM

## 2022-05-17 ENCOUNTER — Telehealth: Payer: Self-pay

## 2022-05-18 ENCOUNTER — Other Ambulatory Visit: Payer: Self-pay | Admitting: Nurse Practitioner

## 2022-05-20 ENCOUNTER — Other Ambulatory Visit: Payer: Self-pay | Admitting: Nurse Practitioner

## 2022-05-20 MED ORDER — HYDROCODONE-ACETAMINOPHEN 5-325 MG PO TABS: 1.0000 | ORAL_TABLET | Freq: Four times a day (QID) | ORAL | 0 refills | Status: DC | PRN

## 2022-05-20 NOTE — Progress Notes (Signed)
Pt was notified.  

## 2022-05-21 NOTE — Telephone Encounter (Signed)
error 

## 2022-05-24 DIAGNOSIS — M25562 Pain in left knee: Secondary | ICD-10-CM | POA: Diagnosis not present

## 2022-05-24 DIAGNOSIS — M7632 Iliotibial band syndrome, left leg: Secondary | ICD-10-CM | POA: Diagnosis not present

## 2022-05-27 ENCOUNTER — Other Ambulatory Visit: Payer: Self-pay | Admitting: *Deleted

## 2022-05-27 DIAGNOSIS — N200 Calculus of kidney: Secondary | ICD-10-CM

## 2022-05-31 DIAGNOSIS — Z79899 Other long term (current) drug therapy: Secondary | ICD-10-CM | POA: Diagnosis not present

## 2022-05-31 DIAGNOSIS — J449 Chronic obstructive pulmonary disease, unspecified: Secondary | ICD-10-CM | POA: Diagnosis not present

## 2022-05-31 DIAGNOSIS — R350 Frequency of micturition: Secondary | ICD-10-CM | POA: Diagnosis not present

## 2022-06-02 ENCOUNTER — Other Ambulatory Visit: Payer: Self-pay | Admitting: Nurse Practitioner

## 2022-06-02 DIAGNOSIS — F17219 Nicotine dependence, cigarettes, with unspecified nicotine-induced disorders: Secondary | ICD-10-CM

## 2022-06-02 DIAGNOSIS — F320 Major depressive disorder, single episode, mild: Secondary | ICD-10-CM

## 2022-06-07 DIAGNOSIS — Z96652 Presence of left artificial knee joint: Secondary | ICD-10-CM | POA: Diagnosis not present

## 2022-06-08 ENCOUNTER — Other Ambulatory Visit: Payer: Self-pay | Admitting: Nurse Practitioner

## 2022-06-10 ENCOUNTER — Other Ambulatory Visit: Payer: Medicare Other

## 2022-06-11 ENCOUNTER — Telehealth: Payer: Self-pay

## 2022-06-11 ENCOUNTER — Other Ambulatory Visit: Payer: Self-pay | Admitting: Nurse Practitioner

## 2022-06-11 MED ORDER — HYDROCODONE-ACETAMINOPHEN 5-325 MG PO TABS: 1.0000 | ORAL_TABLET | Freq: Four times a day (QID) | ORAL | 0 refills | Status: DC | PRN

## 2022-06-11 NOTE — Telephone Encounter (Signed)
Spoke with pt that we will need 10 days and make appt for next week

## 2022-06-11 NOTE — Telephone Encounter (Signed)
Lmom that we send med °

## 2022-06-17 ENCOUNTER — Ambulatory Visit: Payer: Medicare Other | Admitting: Nurse Practitioner

## 2022-06-21 ENCOUNTER — Encounter: Payer: Self-pay | Admitting: Nurse Practitioner

## 2022-06-21 ENCOUNTER — Ambulatory Visit (INDEPENDENT_AMBULATORY_CARE_PROVIDER_SITE_OTHER): Payer: Medicare Other | Admitting: Nurse Practitioner

## 2022-06-21 VITALS — BP 111/70 | HR 75 | Temp 98.2°F | Resp 16 | Ht 72.0 in | Wt 208.4 lb

## 2022-06-21 DIAGNOSIS — F320 Major depressive disorder, single episode, mild: Secondary | ICD-10-CM | POA: Diagnosis not present

## 2022-06-21 DIAGNOSIS — N200 Calculus of kidney: Secondary | ICD-10-CM | POA: Diagnosis not present

## 2022-06-21 DIAGNOSIS — G8929 Other chronic pain: Secondary | ICD-10-CM | POA: Diagnosis not present

## 2022-06-21 DIAGNOSIS — M25562 Pain in left knee: Secondary | ICD-10-CM

## 2022-06-21 MED ORDER — HYDROCODONE-ACETAMINOPHEN 7.5-325 MG PO TABS: 1.0000 | ORAL_TABLET | Freq: Two times a day (BID) | ORAL | 0 refills | Status: DC | PRN

## 2022-06-21 NOTE — Progress Notes (Cosign Needed Addendum)
[facility] [place], [place] [address]  Internal MEDICINE  Office Visit Note  Patient Name: [name] [name]  [id number]  [id number]  Date of Service: 06/21/2022  Chief Complaint  Patient presents with   Follow-up   Depression    HPI [name] presents for a follow-up visit for kidney stones, flank pain and depression.  Kidney stones -- causes flank pain bilaterally, too small for laser treatment.  Chronic left knee pain -- arthritis, pain medication helps to make it tolerable. Takes meloxicam. Was taking percocet, this was discontinued for a lower, less potent dose.  Depression --takes duloxetine and bupropion.  Ears feel full, decreased hearing   Current Medication: Outpatient Encounter Medications as of 06/21/2022  Medication Sig   albuterol (VENTOLIN HFA) 108 (90 Base) MCG/ACT inhaler Inhale 2 puffs into the lungs every 6 (six) hours as needed for wheezing or shortness of breath.   allopurinol (ZYLOPRIM) 100 MG tablet TAKE 2 TABLETS BY MOUTH DAILY   aspirin EC 81 MG EC tablet Take 1 tablet (81 mg total) by mouth daily. Swallow whole.   buPROPion (WELLBUTRIN SR) 150 MG 12 hr tablet TAKE 1 TABLET(150 MG) BY MOUTH TWICE DAILY   clopidogrel (PLAVIX) 75 MG tablet TAKE 1 TABLET(75 MG) BY MOUTH DAILY WITH BREAKFAST   DULoxetine (CYMBALTA) 30 MG capsule TAKE 1 CAPSULE(30 MG) BY MOUTH DAILY   HYDROcodone-acetaminophen (NORCO) 7.5-325 MG tablet Take 1 tablet by mouth 2 (two) times daily as needed for moderate pain or severe pain.   meloxicam (MOBIC) 7.5 MG tablet TAKE 1 TABLET(7.5 MG) BY MOUTH DAILY   sildenafil (VIAGRA) 100 MG tablet Take 1 tablet (100 mg total) by mouth daily as needed for erectile dysfunction.   tamsulosin (FLOMAX) 0.4 MG CAPS capsule Take 1 capsule (0.4 mg total) by mouth daily.   traMADol (ULTRAM) 50 MG tablet Take 1 tablet (50 mg total) by mouth every 12 (twelve) hours as needed for moderate pain or severe pain.   TRELEGY ELLIPTA 100-62.5-25  MCG/ACT AEPB TAKE 1 INHALATION INTO THE LUNGS BY MOUTH DAILY   triamcinolone ointment (KENALOG) 0.5 % Apply 1 Application topically 2 (two) times daily. To affected area until resolved.   varenicline (CHANTIX) 0.5 MG tablet TAKE 1 TABLET(0.5 MG) BY MOUTH TWICE DAILY   [DISCONTINUED] HYDROcodone-acetaminophen (NORCO/VICODIN) 5-325 MG tablet Take 1 tablet by mouth every 6 (six) hours as needed for severe pain.   [DISCONTINUED] tolterodine (DETROL LA) 4 MG 24 hr capsule TAKE 1 CAPSULE(4 MG) BY MOUTH DAILY   atorvastatin (LIPITOR) 80 MG tablet Take 1 tablet (80 mg total) by mouth daily.   gabapentin (NEURONTIN) 100 MG capsule Take 2 capsules (200 mg total) by mouth 2 (two) times daily.   No facility-administered encounter medications on file as of 06/21/2022.    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    Medical History: Past Medical History:  Diagnosis Date   Arthritis    COPD (chronic obstructive pulmonary disease) ([place])    Depression    Emphysema of lung (  [place])    Gout    left knee   Heart attack ([place])    Kidney stones    bilateral    Family History: Family History  Problem Relation Age of Onset   Alcohol abuse Mother    Alcohol abuse Father    Alcohol abuse Brother     Social History   Socioeconomic History   Marital status: Married    Spouse name: Not on file   Number of children: Not on file   Years of education: Not on file   Highest education level: Not on file   Occupational History   Not on file  Tobacco Use   Smoking status: Every Day    Packs/day: 2.00    Years: 52.00    Total pack years: 104.00    Types: Cigarettes   Smokeless tobacco: Never   Tobacco comments:    1/2 pack daily  Vaping Use   Vaping Use: Never used  Substance and Sexual Activity   Alcohol use: Not Currently   Drug use: Never   Sexual activity: Not on file  Other Topics Concern   Not on file  Social History Narrative   Not on file   Social Determinants of Health   Financial Resource Strain: Low Risk  (08/10/2021)   Overall Financial Resource Strain (CARDIA)    Difficulty of Paying Living Expenses: Not hard at all  Food Insecurity: Not on file  Transportation Needs: Not on file  Physical Activity: Not on file  Stress: Not on file  Social Connections: Not on file  Intimate Partner Violence: Not on file      Review of Systems  Constitutional:  Negative for chills, fatigue and unexpected weight change.  HENT:  Positive for congestion, ear pain, hearing loss and postnasal drip. Negative for rhinorrhea, sneezing and sore throat.   Respiratory: Negative.  Negative for cough, chest tightness and shortness of breath.   Cardiovascular: Negative.  Negative for chest pain and palpitations.  Gastrointestinal: Negative.  Negative for abdominal pain, constipation, diarrhea, nausea and vomiting.  Genitourinary:  Negative for dysuria and frequency.  Musculoskeletal: Negative.  Negative for arthralgias, back pain, joint swelling and neck pain.  Skin:  Negative for rash.  Neurological: Negative.  Negative for tremors and numbness.  Hematological:  Negative for adenopathy. Does not bruise/bleed easily.  Psychiatric/Behavioral:  Positive for behavioral problems (Depression). Negative for self-injury, sleep disturbance and suicidal ideas. The patient is nervous/anxious.     Vital Signs: BP 111/70   Pulse 75   Temp 98.2 F (36.8 C)   Resp 16   Ht 6' (1.829 m)   Wt 208  lb 6.4 oz (94.5 kg)   SpO2 98%   BMI 28.26 kg/m    Physical Exam Vitals reviewed.  Constitutional:      General: He is not in acute distress.    Appearance: Normal appearance. He is not ill-appearing.  HENT:     Head: Normocephalic and atraumatic.     Right Ear: Tympanic membrane, ear canal and external ear normal.     Left Ear: Tympanic membrane, ear canal and external ear normal.  Eyes:     Pupils: Pupils are equal, round, and reactive to light.  Cardiovascular:     Rate and Rhythm: Normal rate and regular rhythm.  Pulmonary:     Effort: Pulmonary effort is normal. No respiratory distress.  Neurological:     Mental Status: He is alert and oriented to person, place, and time.  Psychiatric:  Mood and Affect: Mood normal.        Behavior: Behavior normal.        Assessment/Plan: 1. Chronic pain of left knee May take norco 7.5 mg prn as prescribed for chronic left knee pain - HYDROcodone-acetaminophen (NORCO) 7.5-325 MG tablet; Take 1 tablet by mouth 2 (two) times daily as needed for moderate pain or severe pain.  Dispense: 60 tablet; Refill: 0  2. Bilateral nephrolithiasis Small stones eventually passing on their own, norco is helping with this pain too. Followed by urology as well.   3. Depression, major, single episode, mild (HCC) Stable, Continue bupropion and duloxetine as prescribed.   General Counseling: Willam verbalizes understanding of the findings of todays visit and agrees with plan of treatment. I have discussed any further diagnostic evaluation that may be needed or ordered today. We also reviewed his medications today. he has been encouraged to call the office with any questions or concerns that should arise related to todays visit.    No orders of the defined types were placed in this encounter.   Meds ordered this encounter  Medications   HYDROcodone-acetaminophen (NORCO) 7.5-325 MG tablet    Sig: Take 1 tablet by mouth 2 (two) times daily as  needed for moderate pain or severe pain.    Dispense:  60 tablet    Refill:  0    Note increased dose, not new med, just reordered with increase.    Return in about 1 month (around 07/22/2022) for F/U, eval new med, [place] PCP.   Total time spent:30 Minutes Time spent includes review of chart, medications, test results, and follow up plan with the patient.   [place] Controlled Substance Database was reviewed by me.  This patient was seen by Jonetta Osgood, FNP-C in collaboration with Dr. Clayborn Bigness as a part of collaborative care agreement.   Keaton Beichner R. Valetta Fuller, MSN, FNP-C Internal medicine

## 2022-06-24 ENCOUNTER — Other Ambulatory Visit: Payer: Self-pay | Admitting: Urology

## 2022-06-24 ENCOUNTER — Other Ambulatory Visit: Payer: Medicare Other

## 2022-06-24 DIAGNOSIS — N200 Calculus of kidney: Secondary | ICD-10-CM | POA: Diagnosis not present

## 2022-06-25 LAB — LITHOLINK SERUM PANEL
CO2: 25 mmol/L (ref 20–29)
Calcium: 9.4 mg/dL (ref 8.6–10.2)
Chloride: 100 mmol/L (ref 96–106)
Creatinine, Ser: 1.1 mg/dL (ref 0.76–1.27)
Magnesium: 2.2 mg/dL (ref 1.6–2.3)
Phosphorus: 4.2 mg/dL — ABNORMAL HIGH (ref 2.8–4.1)
Potassium: 4.6 mmol/L (ref 3.5–5.2)
Sodium: 139 mmol/L (ref 134–144)
Uric Acid: 4.1 mg/dL (ref 3.8–8.4)
eGFR: 73 mL/min/{1.73_m2} (ref >59)

## 2022-06-26 LAB — LITHOLINK SERUM PANEL
CO2: 26 mmol/L (ref 20–29)
Calcium: 9.5 mg/dL (ref 8.6–10.2)
Chloride: 101 mmol/L (ref 96–106)
Creatinine, Ser: 1.24 mg/dL (ref 0.76–1.27)
Magnesium: 2.1 mg/dL (ref 1.6–2.3)
Phosphorus: 4.5 mg/dL — ABNORMAL HIGH (ref 2.8–4.1)
Potassium: 4.7 mmol/L (ref 3.5–5.2)
Sodium: 142 mmol/L (ref 134–144)
Uric Acid: 4.1 mg/dL (ref 3.8–8.4)
eGFR: 63 mL/min/{1.73_m2} (ref >59)

## 2022-06-29 LAB — LITHOLINK 24HR URINE PANEL
Ammonium, Urine: 23 mmol/24 hr (ref 15–60)
Calcium Oxalate Saturation: 7.24 (ref 6.00–10.00)
Calcium Phosphate Saturation: 0.57 (ref 0.50–2.00)
Calcium, Urine: 98 mg/24 hr (ref <250)
Calcium/Creatinine Ratio: 59 mg/g creat (ref 34–196)
Calcium/Kg Body Weight: 1 mg/24 hr/kg (ref <4.0)
Chloride, Urine: 82 mmol/24 hr (ref 70–250)
Citrate, Urine: 390 mg/24 hr — ABNORMAL LOW (ref >450)
Creatinine, Urine: 1679 mg/24 hr
Creatinine/Kg Body Weight: 17.8 mg/24 hr/kg (ref 11.9–24.4)
Cystine, Urine, Qualitative: NEGATIVE
Magnesium, Urine: 100 mg/24 hr (ref 30–120)
Oxalate, Urine: 41 mg/24 hr — ABNORMAL HIGH (ref 20–40)
Phosphorus, Urine: 1004 mg/24 hr (ref 600–1200)
Potassium, Urine: 63 mmol/24 hr (ref 20–100)
Protein Catabolic Rate: 0.7 g/kg/24 hr — ABNORMAL LOW (ref 0.8–1.4)
Sodium, Urine: 91 mmol/24 hr (ref 50–150)
Sulfate, Urine: 13 meq/24 hr — ABNORMAL LOW (ref 20–80)
Urea Nitrogen, Urine: 7.05 g/24 hr (ref 6.00–14.00)
Uric Acid Saturation: 0.73 (ref <1.00)
Uric Acid, Urine: 285 mg/24 hr (ref <800)
Urine Volume (Preserved): 1310 mL/24 hr (ref 500–4000)
pH, 24 hr, Urine: 5.708 — ABNORMAL LOW (ref 5.800–6.200)

## 2022-06-30 ENCOUNTER — Encounter: Payer: Self-pay | Admitting: *Deleted

## 2022-07-06 ENCOUNTER — Encounter: Payer: Self-pay | Admitting: Internal Medicine

## 2022-07-06 ENCOUNTER — Ambulatory Visit: Payer: Medicare Other | Admitting: Internal Medicine

## 2022-07-06 VITALS — BP 104/66 | HR 70 | Temp 98.3°F | Resp 16 | Ht 72.0 in | Wt 202.0 lb

## 2022-07-06 DIAGNOSIS — F17200 Nicotine dependence, unspecified, uncomplicated: Secondary | ICD-10-CM

## 2022-07-06 DIAGNOSIS — M25562 Pain in left knee: Secondary | ICD-10-CM | POA: Diagnosis not present

## 2022-07-06 DIAGNOSIS — I214 Non-ST elevation (NSTEMI) myocardial infarction: Secondary | ICD-10-CM | POA: Diagnosis not present

## 2022-07-06 DIAGNOSIS — J4489 Other specified chronic obstructive pulmonary disease: Secondary | ICD-10-CM

## 2022-07-06 DIAGNOSIS — G8929 Other chronic pain: Secondary | ICD-10-CM | POA: Diagnosis not present

## 2022-07-06 NOTE — Progress Notes (Signed)
[facility] [place], [place] [address]  Pulmonary Sleep Medicine   Office Visit Note  Patient Name: [name] [name] DOB: 08-15-1956 MRN [id number]  Date of Service: 07/06/2022  Complaints/HPI: COPD and smoker. States he is smoking about a half pack. Still not able to quit. History of NSTEMI and has been stable since the last event.  There has been no further chest pain.  Has had some issues with shortness of breath.  Unfortunately also has a history of COPD and unfortunately also has been a smoker.  Patient is currently on albuterol as well as Trelegy by history.  Seems to do some benefit however still does have some cough and shortness of breath when the patient exerts himself  ROS  General: (-) fever, (-) chills, (-) night sweats, (-) weakness Skin: (-) rashes, (-) itching,. Eyes: (-) visual changes, (-) redness, (-) itching. Nose and Sinuses: (-) nasal stuffiness or itchiness, (-) postnasal drip, (-) nosebleeds, (-) sinus trouble. Mouth and Throat: (-) sore throat, (-) hoarseness. Neck: (-) swollen glands, (-) enlarged thyroid, (-) neck pain. Respiratory: + cough, (-) bloody sputum, + shortness of breath, - wheezing. Cardiovascular: - ankle swelling, (-) chest pain. Lymphatic: (-) lymph node enlargement. Neurologic: (-) numbness, (-) tingling. Psychiatric: (-) anxiety, (-) depression   Current Medication: Outpatient Encounter Medications as of 07/06/2022  Medication Sig   albuterol (VENTOLIN HFA) 108 (90 Base) MCG/ACT inhaler Inhale 2 puffs into the lungs every 6 (six) hours as needed for wheezing or shortness of breath.   allopurinol (ZYLOPRIM) 100 MG tablet TAKE 2 TABLETS BY MOUTH DAILY   aspirin EC 81 MG EC tablet Take 1 tablet (81 mg total) by mouth daily. Swallow whole.   buPROPion (WELLBUTRIN SR) 150 MG 12 hr tablet TAKE 1 TABLET(150 MG) BY MOUTH TWICE DAILY   clopidogrel (PLAVIX) 75 MG tablet TAKE 1 TABLET(75 MG) BY MOUTH DAILY WITH  BREAKFAST   DULoxetine (CYMBALTA) 30 MG capsule TAKE 1 CAPSULE(30 MG) BY MOUTH DAILY   HYDROcodone-acetaminophen (NORCO) 7.5-325 MG tablet Take 1 tablet by mouth 2 (two) times daily as needed for moderate pain or severe pain.   meloxicam (MOBIC) 7.5 MG tablet TAKE 1 TABLET(7.5 MG) BY MOUTH DAILY   sildenafil (VIAGRA) 100 MG tablet Take 1 tablet (100 mg total) by mouth daily as needed for erectile dysfunction.   tamsulosin (FLOMAX) 0.4 MG CAPS capsule Take 1 capsule (0.4 mg total) by mouth daily.   tolterodine (DETROL LA) 4 MG 24 hr capsule TAKE 1 CAPSULE(4 MG) BY MOUTH DAILY   traMADol (ULTRAM) 50 MG tablet Take 1 tablet (50 mg total) by mouth every 12 (twelve) hours as needed for moderate pain or severe pain.   TRELEGY ELLIPTA 100-62.5-25 MCG/ACT AEPB TAKE 1 INHALATION INTO THE LUNGS BY MOUTH DAILY   triamcinolone ointment (KENALOG) 0.5 % Apply 1 Application topically 2 (two) times daily. To affected area until resolved.   varenicline (CHANTIX) 0.5 MG tablet TAKE 1 TABLET(0.5 MG) BY MOUTH TWICE DAILY   atorvastatin (LIPITOR) 80 MG tablet Take 1 tablet (80 mg total) by mouth daily.   gabapentin (NEURONTIN) 100 MG capsule Take 2 capsules (200 mg total) by mouth 2 (two) times daily.   No facility-administered encounter medications on file as of 07/06/2022.    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    Medical History: Past Medical History:  Diagnosis Date   Arthritis    COPD (chronic  obstructive pulmonary disease) ([place])    Depression    Emphysema of lung (HCC)    Gout    left knee   Heart attack ([place])    Kidney stones    bilateral    Family History: Family History  Problem Relation Age of Onset   Alcohol abuse Mother    Alcohol abuse Father    Alcohol abuse Brother     Social History: Social History   Socioeconomic History   Marital status: Married    Spouse name: Not on file   Number of children: Not on file   Years of education: Not on file   Highest education level: Not on file  Occupational History   Not on file  Tobacco Use   Smoking status: Every Day    Packs/day: 2.00    Years: 52.00    Total pack years: 104.00    Types: Cigarettes   Smokeless tobacco: Never   Tobacco comments:    1/2 pack daily  Vaping Use   Vaping Use: Never used  Substance and Sexual Activity   Alcohol use: Not Currently   Drug use: Never   Sexual activity: Not on file  Other Topics Concern   Not on file  Social History Narrative   Not on file   Social Determinants of Health   Financial Resource Strain: Low Risk  (08/10/2021)   Overall Financial Resource Strain (CARDIA)    Difficulty of Paying Living Expenses: Not hard at all  Food Insecurity: Not on file  Transportation Needs: Not on file  Physical Activity: Not on file  Stress: Not on file  Social Connections: Not on file  Intimate Partner Violence: Not on file    Vital Signs: Blood pressure 104/66, pulse 70, temperature 98.3 F (36.8 C), resp. rate 16, height 6' (1.829 m), weight 202 lb (91.6 kg), SpO2 98 %.  Examination: General Appearance: The patient is well-developed, well-nourished, and in no distress. Skin: Gross inspection of skin unremarkable. Head: normocephalic, no gross deformities. Eyes: no gross deformities noted. ENT: ears appear grossly normal no exudates. Neck: Supple. No thyromegaly. No LAD. Respiratory: few rhonchi noted. Cardiovascular: Normal S1 and S2 without murmur or  rub. Extremities: No cyanosis. pulses are equal. Neurologic: Alert and oriented. No involuntary movements.  LABS: Recent Results (from the past 2160 hour(s))  [facility] Serum Panel     Status: Abnormal   Collection Time: 06/24/22 12:00 AM  Result Value Ref Range   Uric Acid 4.1 3.8 - 8.4 mg/dL    Comment:            Therapeutic target for gout patients: <6.0   Creatinine, Ser 1.24 0.76 - 1.27 mg/dL   eGFR 63 >59 mL/min/1.73   Sodium 142 134 - 144 mmol/L   Potassium 4.7 3.5 - 5.2 mmol/L   Chloride 101 96 - 106 mmol/L   CO2 26 20 - 29 mmol/L   Calcium 9.5 8.6 - 10.2 mg/dL   Phosphorus 4.5 (H) 2.8 - 4.1 mg/dL   Magnesium 2.1 1.6 - 2.3 mg/dL  [facility] 24Hr Urine Panel  Status: Abnormal   Collection Time: 06/24/22  7:00 AM  Result Value Ref Range   Cystine, Urine, Qualitative Neg Negative   Urine Volume (Preserved) 1,310 500 - 4,000 mL/24 hr   Calcium Oxalate Saturation 7.24 6.00 - 10.00   Calcium, Urine 98 <250 mg/24 hr   Oxalate, Urine 41 (H) 20 - 40 mg/24 hr   Citrate, Urine 390 (L) >450 mg/24 hr   Calcium Phosphate Saturation 0.57 0.50 - 2.00   pH, 24 hr, Urine 5.708 (L) 5.800 - 6.200   Uric Acid Saturation 0.73 <1.00   Uric Acid, Urine 285 <800 mg/24 hr   Sodium, Urine 91 50 - 150 mmol/24 hr   Potassium, Urine 63 20 - 100 mmol/24 hr   Magnesium, Urine 100 30 - 120 mg/24 hr   Phosphorus, Urine 1,004 600 - 1,200 mg/24 hr   Ammonium, Urine 23 15 - 60 mmol/24 hr   Chloride, Urine 82 70 - 250 mmol/24 hr   Sulfate, Urine 13 (L) 20 - 80 meq/24 hr   Urea Nitrogen, Urine 7.05 6.00 - 14.00 g/24 hr   Protein Catabolic Rate 0.7 (L) 0.8 - 1.4 g/kg/24 hr   Creatinine, Urine 3,500 Not Applic. XF/81 hr   Creatinine/Kg Body Weight 17.8 11.9 - 24.4 mg/24 hr/kg   Calcium/Kg Body Weight 1.0 <4.0 mg/24 hr/kg   Calcium/Creatinine Ratio 59 34 - 196 mg/g creat   Comment Note   [facility] Serum Panel     Status: Abnormal   Collection Time: 06/24/22  8:44 AM  Result Value Ref Range   Uric  Acid 4.1 3.8 - 8.4 mg/dL    Comment:            Therapeutic target for gout patients: <6.0   Creatinine, Ser 1.10 0.76 - 1.27 mg/dL   eGFR 73 >59 mL/min/1.73   Sodium 139 134 - 144 mmol/L   Potassium 4.6 3.5 - 5.2 mmol/L   Chloride 100 96 - 106 mmol/L   CO2 25 20 - 29 mmol/L   Calcium 9.4 8.6 - 10.2 mg/dL   Phosphorus 4.2 (H) 2.8 - 4.1 mg/dL   Magnesium 2.2 1.6 - 2.3 mg/dL    Radiology: CT Abdomen Pelvis Wo Contrast  Result Date: 03/08/2022 CLINICAL DATA:  Pain right lower quadrant, right flank pain EXAM: CT ABDOMEN AND PELVIS WITHOUT CONTRAST TECHNIQUE: Multidetector CT imaging of the abdomen and pelvis was performed following the standard protocol without IV contrast. RADIATION DOSE REDUCTION: This exam was performed according to the departmental dose-optimization program which includes automated exposure control, adjustment of the mA and/or kV according to patient size and/or use of iterative reconstruction technique. COMPARISON:  Abdominal sonogram done on 11/19/2020 FINDINGS: Lower chest: Scattered coronary artery calcifications are seen. There is contrast in the lumen of visualized lower thoracic esophagus suggesting gastroesophageal reflux. Hepatobiliary: There is 8 mm ill-defined low-density in the left lobe. There is no dilation of bile ducts. Gallbladder is unremarkable. Pancreas: No focal abnormality is seen. Spleen: Unremarkable. Adrenals/Urinary Tract: Adrenals are unremarkable. There are few small punctate bilateral renal stones each measuring less than 2 mm. There is no hydronephrosis. There is 1.4 cm low-density in the midportion of left kidney, possibly a cyst. Ureters are not dilated. Urinary bladder is unremarkable. Stomach/Bowel: Stomach is moderately distended. Small bowel loops are not dilated. Appendix is not dilated. There is no pericecal inflammation. There is no significant wall thickening in the colon. There is no pericolic stranding. Vascular/Lymphatic: Left renal vein is  retroaortic. Reproductive: Prostate is  enlarged. Large left hydrocele is seen. Scrotum is not included in its entirety limiting evaluation. Other: There is no ascites or pneumoperitoneum. Musculoskeletal: Degenerative changes are noted in the lumbar spine with disc space narrowing, bony spurs and endplate sclerosis. There is encroachment of neural foramina at multiple levels. Schmorl's nodes are seen in multiple lumbar vertebral bodies. IMPRESSION: There is no evidence of intestinal obstruction or pneumoperitoneum. Appendix is not dilated. There is no hydronephrosis. Multiple small bilateral renal stones. Possible 1.4 cm left renal cyst. There is 8 mm low-density in the left lobe of liver, possibly cyst or hemangioma. Gastroesophageal reflux.  Lumbar spondylosis.  Left hydrocele. Electronically Signed   By: Elmer Picker M.D.   On: 03/08/2022 08:56    No results found.  No results found.    Assessment and Plan: Patient Active Problem List   Diagnosis Date Noted   Coronary artery disease involving native coronary artery of native heart without angina pectoris    NSTEMI (non-ST elevated myocardial infarction) ([place]) 05/17/2021   Centrilobular emphysema ([place]) 04/07/2021   Cigarette nicotine dependence with nicotine-induced disorder 04/07/2021   Depression, major, single episode, mild ([place]) 04/07/2021   History of colonic polyps    Polyp of sigmoid colon    Rectal polyp    Elevated PSA 12/26/2019   Benign prostatic hyperplasia with urinary frequency 12/26/2019    1. Obstructive chronic bronchitis without exacerbation Patient has significant COPD spoke to him about importance of smoking cessation.  Especially in the setting of cardiac disease and history of non-STEMI recent as well as history of COPD is very important for him to quit smoking  2. Chronic pain of left knee Pain management supportive care  3. Smoker Stop smoking immediately  4. NSTEMI (non-ST elevated myocardial  infarction) [facility]) Follow-up with cardiology  General Counseling: I have discussed the findings of the evaluation and examination with [facility].  I have also discussed any further diagnostic evaluation thatmay be needed or ordered today. Azaria verbalizes understanding of the findings of todays visit. We also reviewed his medications today and discussed drug interactions and side effects including but not limited excessive drowsiness and altered mental states. We also discussed that there is always a risk not just to him but also people around him. he has been encouraged to call the office with any questions or concerns that should arise related to todays visit.  No orders of the defined types were placed in this encounter.    Time spent: 23  I have personally obtained a history, examined the patient, evaluated laboratory and imaging results, formulated the assessment and plan and placed orders.    Allyne Gee, MD [facility] Pulmonary and Critical Care Sleep medicine

## 2022-07-06 NOTE — Patient Instructions (Signed)
Steps to Quit Smoking Smoking tobacco is the leading cause of preventable death. It can affect almost every organ in the body. Smoking puts you and people around you at risk for many serious, long-lasting (chronic) diseases. Quitting smoking can be hard, but it is one of the best things that you can do for your health. It is never too late to quit. Do not give up if you cannot quit the first time. Some people need to try many times to quit. Do your best to stick to your quit plan, and talk with your doctor if you have any questions or concerns. How do I get ready to quit? Pick a date to quit. Set a date within the next 2 weeks to give you time to prepare. Write down the reasons why you are quitting. Keep this list in places where you will see it often. Tell your family, friends, and co-workers that you are quitting. Their support is important. Talk with your doctor about the choices that may help you quit. Find out if your health insurance will pay for these treatments. Know the people, places, things, and activities that make you want to smoke (triggers). Avoid them. What first steps can I take to quit smoking? Throw away all cigarettes at home, at work, and in your car. Throw away the things that you use when you smoke, such as ashtrays and lighters. Clean your car. Empty the ashtray. Clean your home, including curtains and carpets. What can I do to help me quit smoking? Talk with your doctor about taking medicines and seeing a counselor. You are more likely to succeed when you do both. If you are pregnant or breastfeeding: Talk with your doctor about counseling or other ways to quit smoking. Do not take medicine to help you quit smoking unless your doctor tells you to. Quit right away Quit smoking completely, instead of slowly cutting back on how much you smoke over a period of time. Stopping smoking right away may be more successful than slowly quitting. Go to counseling. In-person is best  if this is an option. You are more likely to quit if you go to counseling sessions regularly. Take medicine You may take medicines to help you quit. Some medicines need a prescription, and some you can buy over-the-counter. Some medicines may contain a drug called nicotine to replace the nicotine in cigarettes. Medicines may: Help you stop having the desire to smoke (cravings). Help to stop the problems that come when you stop smoking (withdrawal symptoms). Your doctor may ask you to use: Nicotine patches, gum, or lozenges. Nicotine inhalers or sprays. Non-nicotine medicine that you take by mouth. Find resources Find resources and other ways to help you quit smoking and remain smoke-free after you quit. They include: Online chats with a counselor. Phone quitlines. Printed self-help materials. Support groups or group counseling. Text messaging programs. Mobile phone apps. Use apps on your mobile phone or tablet that can help you stick to your quit plan. Examples of free services include Quit Guide from the CDC and smokefree.gov  What can I do to make it easier to quit?  Talk to your family and friends. Ask them to support and encourage you. Call a phone quitline, such as 1-800-QUIT-NOW, reach out to support groups, or work with a counselor. Ask people who smoke to not smoke around you. Avoid places that make you want to smoke, such as: Bars. Parties. Smoke-break areas at work. Spend time with people who do not smoke. Lower   the stress in your life. Stress can make you want to smoke. Try these things to lower stress: Getting regular exercise. Doing deep-breathing exercises. Doing yoga. Meditating. What benefits will I see if I quit smoking? Over time, you may have: A better sense of smell and taste. Less coughing and sore throat. A slower heart rate. Lower blood pressure. Clearer skin. Better breathing. Fewer sick days. Summary Quitting smoking can be hard, but it is one of  the best things that you can do for your health. Do not give up if you cannot quit the first time. Some people need to try many times to quit. When you decide to quit smoking, make a plan to help you succeed. Quit smoking right away, not slowly over a period of time. When you start quitting, get help and support to keep you smoke-free. This information is not intended to replace advice given to you by your health care provider. Make sure you discuss any questions you have with your health care provider. Document Revised: 08/28/2021 Document Reviewed: 08/28/2021 Elsevier Patient Education  2023 Elsevier Inc.  

## 2022-07-07 ENCOUNTER — Telehealth: Payer: Medicare Other

## 2022-07-20 ENCOUNTER — Other Ambulatory Visit: Payer: Self-pay | Admitting: Nurse Practitioner

## 2022-07-20 DIAGNOSIS — N401 Enlarged prostate with lower urinary tract symptoms: Secondary | ICD-10-CM

## 2022-07-23 ENCOUNTER — Ambulatory Visit: Payer: Medicare Other | Admitting: Nurse Practitioner

## 2022-08-01 ENCOUNTER — Encounter: Payer: Self-pay | Admitting: Nurse Practitioner

## 2022-08-10 ENCOUNTER — Other Ambulatory Visit: Payer: Self-pay | Admitting: Nurse Practitioner

## 2022-08-10 DIAGNOSIS — F320 Major depressive disorder, single episode, mild: Secondary | ICD-10-CM

## 2022-08-27 ENCOUNTER — Ambulatory Visit: Payer: Medicare Other | Admitting: Nurse Practitioner

## 2022-09-09 ENCOUNTER — Telehealth: Payer: Self-pay | Admitting: Nurse Practitioner

## 2022-09-09 ENCOUNTER — Ambulatory Visit (INDEPENDENT_AMBULATORY_CARE_PROVIDER_SITE_OTHER): Payer: Medicare Other | Admitting: Nurse Practitioner

## 2022-09-09 ENCOUNTER — Encounter: Payer: Self-pay | Admitting: Nurse Practitioner

## 2022-09-09 VITALS — BP 125/68 | HR 68 | Temp 97.8°F | Resp 16 | Ht 72.0 in | Wt 211.8 lb

## 2022-09-09 DIAGNOSIS — M65332 Trigger finger, left middle finger: Secondary | ICD-10-CM

## 2022-09-09 DIAGNOSIS — Z760 Encounter for issue of repeat prescription: Secondary | ICD-10-CM

## 2022-09-09 DIAGNOSIS — E782 Mixed hyperlipidemia: Secondary | ICD-10-CM | POA: Diagnosis not present

## 2022-09-09 DIAGNOSIS — L602 Onychogryphosis: Secondary | ICD-10-CM | POA: Diagnosis not present

## 2022-09-09 DIAGNOSIS — R300 Dysuria: Secondary | ICD-10-CM

## 2022-09-09 DIAGNOSIS — Z0001 Encounter for general adult medical examination with abnormal findings: Secondary | ICD-10-CM | POA: Diagnosis not present

## 2022-09-09 DIAGNOSIS — R7303 Prediabetes: Secondary | ICD-10-CM

## 2022-09-09 DIAGNOSIS — M65331 Trigger finger, right middle finger: Secondary | ICD-10-CM

## 2022-09-09 LAB — POCT GLYCOSYLATED HEMOGLOBIN (HGB A1C): Hemoglobin A1C: 5.9 % — AB (ref 4.0–5.6)

## 2022-09-09 MED ORDER — CLOPIDOGREL BISULFATE 75 MG PO TABS: ORAL_TABLET | ORAL | 1 refills | Status: DC

## 2022-09-09 MED ORDER — BUPROPION HCL ER (SR) 150 MG PO TB12: ORAL_TABLET | ORAL | 1 refills | Status: DC

## 2022-09-09 MED ORDER — HYDROCODONE-ACETAMINOPHEN 7.5-325 MG PO TABS: 1.0000 | ORAL_TABLET | Freq: Two times a day (BID) | ORAL | 0 refills | Status: DC | PRN

## 2022-09-09 MED ORDER — TAMSULOSIN HCL 0.4 MG PO CAPS: 0.4000 mg | ORAL_CAPSULE | Freq: Every day | ORAL | 1 refills | Status: DC

## 2022-09-09 MED ORDER — IBUPROFEN 800 MG PO TABS: 800.0000 mg | ORAL_TABLET | Freq: Three times a day (TID) | ORAL | 1 refills | Status: DC | PRN

## 2022-09-09 MED ORDER — TRELEGY ELLIPTA 100-62.5-25 MCG/ACT IN AEPB: INHALATION_SPRAY | RESPIRATORY_TRACT | 3 refills | Status: DC

## 2022-09-09 MED ORDER — TRAMADOL HCL 50 MG PO TABS: 50.0000 mg | ORAL_TABLET | Freq: Four times a day (QID) | ORAL | 0 refills | Status: DC | PRN

## 2022-09-09 NOTE — Progress Notes (Signed)
[facility] [place], [place] [address]  Internal MEDICINE  Office Visit Note  Patient Name: [name] [name]  [id number]  [id number]  Date of Service: 09/09/2022  Chief Complaint  Patient presents with   Medicare Wellness   Depression    HPI [name] [name] presents for an annual well visit and physical exam.  Well-appearing 66 y.o. male with CAD, emphysema, BPH, depression, and history of MI last year.  Routine CRC screening: done last year, due in 2027 to repeat Labs: PSA done in September, due for other routine labs New or worsening pain: none Other concerns: none  Not having to use hydrocodone very much, taking ibuprofen as needed for pain, takes tramadol or hydrocodone for severe pain not relieved by ibuprofen.  Needs refills        09/09/2022    8:54 AM 09/04/2021   10:57 AM 01/27/2021   11:38 AM  MMSE - Mini Mental State Exam  Orientation to time '5 5 5  '$ Orientation to Place '5 5 5  '$ Registration '3 3 3  '$ Attention/ Calculation '5 5 5  '$ Recall '3 3 3  '$ Language- name 2 objects '2 2 2  '$ Language- repeat '1 1 1  '$ Language- follow 3 step command '3 3 3  '$ Language- read & follow direction '1 1 1  '$ Write a sentence '1 1 1  '$ Copy design '1 1 1  '$ Total score '30 30 30    '$ Functional Status Survey: Is the patient deaf or have difficulty hearing?: Yes Does the patient have difficulty seeing, even when wearing glasses/contacts?: No Does the patient have difficulty concentrating, remembering, or making decisions?: No Does the patient have difficulty walking or climbing stairs?: No Does the patient have difficulty dressing or bathing?: No Does the patient have difficulty doing errands alone such as visiting a doctor's office or shopping?: No     08/03/2021   11:43 AM 09/04/2021   10:57 AM 03/31/2022    1:28 PM 06/21/2022    3:37 PM 09/09/2022    8:53 AM  [place] in the past year? 0 0 0 1 0  Was there an injury with Fall?    0 0  Fall Risk Category  Calculator    1 0  Fall Risk Category    Low Low  Patient Fall Risk Level Low fall risk Low fall risk Low fall risk Low fall risk Low fall risk  Patient at Risk for Falls Due to No Fall Risks No Fall Risks No Fall Risks Impaired balance/gait No Fall Risks  Fall risk Follow up Falls evaluation completed Falls evaluation completed Falls evaluation completed Falls evaluation completed Falls evaluation completed       06/21/2022    3:38 PM  Depression screen PHQ 2/9  Decreased Interest 0  Down, Depressed, Hopeless 0  PHQ - 2 Score 0       Current Medication: Outpatient Encounter Medications as of 09/09/2022  Medication Sig   albuterol (VENTOLIN HFA) 108 (90 Base) MCG/ACT inhaler Inhale 2 puffs into the lungs every 6 (six) hours as needed for wheezing or shortness of breath.   allopurinol (ZYLOPRIM) 100 MG tablet TAKE 2 TABLETS BY MOUTH DAILY   aspirin EC 81 MG EC tablet Take 1 tablet (81 mg total) by mouth daily. Swallow whole.   DULoxetine (CYMBALTA) 30 MG capsule TAKE 1 CAPSULE(30 MG) BY MOUTH DAILY   ibuprofen (ADVIL) 800 MG tablet Take 1 tablet (800 mg total) by mouth every 8 (eight) hours as  needed for moderate pain.   sildenafil (VIAGRA) 100 MG tablet Take 1 tablet (100 mg total) by mouth daily as needed for erectile dysfunction.   tolterodine (DETROL LA) 4 MG 24 hr capsule TAKE 1 CAPSULE(4 MG) BY MOUTH DAILY   triamcinolone ointment (KENALOG) 0.5 % Apply 1 Application topically 2 (two) times daily. To affected area until resolved.   [DISCONTINUED] buPROPion (WELLBUTRIN SR) 150 MG 12 hr tablet TAKE 1 TABLET(150 MG) BY MOUTH TWICE DAILY   [DISCONTINUED] clopidogrel (PLAVIX) 75 MG tablet TAKE 1 TABLET(75 MG) BY MOUTH DAILY WITH BREAKFAST   [DISCONTINUED] HYDROcodone-acetaminophen (NORCO) 7.5-325 MG tablet Take 1 tablet by mouth 2 (two) times daily as needed for moderate pain or severe pain.   [DISCONTINUED] meloxicam (MOBIC) 7.5 MG tablet TAKE 1 TABLET(7.5 MG) BY MOUTH DAILY    [DISCONTINUED] tamsulosin (FLOMAX) 0.4 MG CAPS capsule Take 1 capsule (0.4 mg total) by mouth daily.   [DISCONTINUED] traMADol (ULTRAM) 50 MG tablet Take 1 tablet (50 mg total) by mouth every 12 (twelve) hours as needed for moderate pain or severe pain.   [DISCONTINUED] TRELEGY ELLIPTA 100-62.5-25 MCG/ACT AEPB TAKE 1 INHALATION INTO THE LUNGS BY MOUTH DAILY   [DISCONTINUED] varenicline (CHANTIX) 0.5 MG tablet TAKE 1 TABLET(0.5 MG) BY MOUTH TWICE DAILY   atorvastatin (LIPITOR) 80 MG tablet Take 1 tablet (80 mg total) by mouth daily.   buPROPion (WELLBUTRIN SR) 150 MG 12 hr tablet TAKE 1 TABLET(150 MG) BY MOUTH TWICE DAILY   clopidogrel (PLAVIX) 75 MG tablet TAKE 1 TABLET(75 MG) BY MOUTH DAILY WITH BREAKFAST   Fluticasone-Umeclidin-Vilant (TRELEGY ELLIPTA) 100-62.5-25 MCG/ACT AEPB TAKE 1 INHALATION INTO THE LUNGS BY MOUTH DAILY   gabapentin (NEURONTIN) 100 MG capsule Take 2 capsules (200 mg total) by mouth 2 (two) times daily.   HYDROcodone-acetaminophen (NORCO) 7.5-325 MG tablet Take 1 tablet by mouth 2 (two) times daily as needed for moderate pain or severe pain.   tamsulosin (FLOMAX) 0.4 MG CAPS capsule Take 1 capsule (0.4 mg total) by mouth daily.   traMADol (ULTRAM) 50 MG tablet Take 1 tablet (50 mg total) by mouth every 6 (six) hours as needed for moderate pain or severe pain.   No facility-administered encounter medications on file as of 09/09/2022.    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     Medical History: Past Medical History:  Diagnosis Date   Arthritis    COPD (chronic obstructive pulmonary disease) ([place])    Depression    Emphysema of lung ([place])    Gout    left knee   Heart attack ([place])    Kidney stones    bilateral    Family History: Family History  Problem Relation Age of Onset   Alcohol abuse Mother    Alcohol abuse Father    Alcohol abuse Brother     Social History   Socioeconomic History   Marital status: Married    Spouse name: Not on file   Number of children: Not on file   Years of education: Not on  file   Highest education level: Not on file  Occupational History   Not on file  Tobacco Use   Smoking status: Every Day    Packs/day: 2.00    Years: 52.00    Total pack years: 104.00    Types: Cigarettes   Smokeless tobacco: Never   Tobacco comments:    1/2 pack daily  Vaping Use   Vaping Use: Never used  Substance and Sexual Activity   Alcohol use: Not Currently   Drug use: Never   Sexual activity: Not on file  Other Topics Concern   Not on file  Social History Narrative   Not on file   Social Determinants of Health   Financial Resource Strain: Low Risk  (08/10/2021)   Overall Financial Resource Strain (CARDIA)    Difficulty of Paying Living Expenses: Not hard at all  Food Insecurity: Not on file  Transportation Needs: Not on file  Physical Activity: Not on file  Stress: Not on file  Social Connections: Not on file  Intimate Partner Violence: Not on file      Review of Systems  Constitutional:  Negative for activity change, appetite change, chills, fatigue, fever and unexpected weight change.  HENT: Negative.  Negative for congestion, ear pain, rhinorrhea, sore throat and trouble swallowing.   Eyes: Negative.   Respiratory: Negative.  Negative for cough, chest tightness, shortness of breath and wheezing.   Cardiovascular: Negative.  Negative for chest pain.  Gastrointestinal: Negative.  Negative for abdominal pain,  blood in stool, constipation, diarrhea, nausea and vomiting.  Endocrine: Negative.   Genitourinary: Negative.  Negative for difficulty urinating, dysuria, frequency, hematuria and urgency.  Musculoskeletal:  Positive for arthralgias. Negative for back pain, joint swelling, myalgias and neck pain.       Left knee pain  Skin: Negative.  Negative for rash and wound.  Allergic/Immunologic: Negative.  Negative for immunocompromised state.  Neurological: Negative.  Negative for dizziness, seizures, numbness and headaches.  Hematological: Negative.   Psychiatric/Behavioral:  Negative for behavioral problems, self-injury and suicidal ideas. The patient is not nervous/anxious.     Vital Signs: BP 125/68   Pulse 68   Temp 97.8 F (36.6 C)   Resp 16   Ht 6' (1.829 m)   Wt 211 lb 12.8 oz (96.1 kg)   SpO2 97%   BMI 28.73 kg/m    Physical Exam Vitals reviewed.  Constitutional:      General: He is awake. He is not in acute distress.    Appearance: Normal appearance. He is well-developed, well-groomed and overweight. He is not ill-appearing or diaphoretic.  HENT:     Head: Normocephalic and atraumatic.     Right Ear: Tympanic membrane, ear canal and external ear normal.     Left Ear: Tympanic membrane, ear canal and external ear normal.     Nose: Nose normal. No congestion or rhinorrhea.     Mouth/Throat:     Lips: Pink.     Mouth: Mucous membranes are moist.     Pharynx: Oropharynx is clear. Uvula midline. No oropharyngeal exudate or posterior oropharyngeal erythema.  Eyes:     General: Lids are normal. Vision grossly intact. Gaze aligned appropriately. No scleral icterus.       Right eye: No discharge.        Left eye: No discharge.     Extraocular Movements: Extraocular movements intact.     Conjunctiva/sclera: Conjunctivae normal.     Pupils: Pupils are equal, round, and reactive to  light.     Funduscopic exam:    Right eye: Red reflex present.        Left eye: Red reflex  present. Neck:     Thyroid: No thyromegaly.     Vascular: No carotid bruit or JVD.     Trachea: Trachea and phonation normal. No tracheal deviation.  Cardiovascular:     Rate and Rhythm: Normal rate and regular rhythm.     Pulses: Normal pulses.     Heart sounds: Normal heart sounds, S1 normal and S2 normal. No murmur heard.    No friction rub. No gallop.  Pulmonary:     Effort: Pulmonary effort is normal. No accessory muscle usage or respiratory distress.     Breath sounds: Normal breath sounds and air entry. No stridor. No wheezing or rales.  Chest:     Chest wall: No tenderness.  Abdominal:     General: Bowel sounds are normal. There is no distension.     Palpations: Abdomen is soft. There is no shifting dullness, fluid wave, mass or pulsatile mass.     Tenderness: There is no abdominal tenderness. There is no guarding or rebound.  Musculoskeletal:        General: No tenderness or deformity. Normal range of motion.     Cervical back: Normal range of motion and neck supple.     Right lower leg: No edema.     Left lower leg: No edema.  Lymphadenopathy:     Cervical: No cervical adenopathy.  Skin:    General: Skin is warm and dry.     Capillary Refill: Capillary refill takes less than 2 seconds.     Coloration: Skin is not pale.     Findings: No erythema or rash.  Neurological:     Mental Status: He is alert and oriented to person, place, and time.     Cranial Nerves: No cranial nerve deficit.     Motor: No abnormal muscle tone.     Coordination: Coordination normal.     Gait: Gait normal.     Deep Tendon Reflexes: Reflexes are normal and symmetric.  Psychiatric:        Mood and Affect: Mood normal.        Behavior: Behavior normal. Behavior is cooperative.        Thought Content: Thought content normal.        Judgment: Judgment normal.        Assessment/Plan: 1. Encounter for general adult medical examination with abnormal findings Age-appropriate preventive  screenings and vaccinations discussed, annual physical exam completed. Routine labs for health maintenance ordered, see below. PHM updated.  - Hepatic function panel - CBC with Differential/Platelet - Lipid Profile - POCT glycosylated hemoglobin (Hb A1C) - B12 and Folate Panel  2. Prediabetes Routine labs ordered - Hepatic function panel - POCT glycosylated hemoglobin (Hb A1C)  3. Acquired trigger finger of both middle fingers Referred to ortho - Ambulatory referral to Orthopedic Surgery  4. Onychauxis Rule out b12 deficiency - B12 and Folate Panel  5. Mixed hyperlipidemia Routine labs ordered - Hepatic function panel - Lipid Profile  6. Dysuria Routine urinalysis done - UA/M w/rflx Culture, Routine  7. Medication refill - ibuprofen (ADVIL) 800 MG tablet; Take 1 tablet (800 mg total) by mouth every 8 (eight) hours as needed for moderate pain.  Dispense: 90 tablet; Refill: 1 - HYDROcodone-acetaminophen (NORCO) 7.5-325 MG tablet; Take 1 tablet by mouth 2 (two) times daily as needed for moderate pain or  severe pain.  Dispense: 60 tablet; Refill: 0 - buPROPion (WELLBUTRIN SR) 150 MG 12 hr tablet; TAKE 1 TABLET(150 MG) BY MOUTH TWICE DAILY  Dispense: 180 tablet; Refill: 1 - tamsulosin (FLOMAX) 0.4 MG CAPS capsule; Take 1 capsule (0.4 mg total) by mouth daily.  Dispense: 90 capsule; Refill: 1 - traMADol (ULTRAM) 50 MG tablet; Take 1 tablet (50 mg total) by mouth every 6 (six) hours as needed for moderate pain or severe pain.  Dispense: 20 tablet; Refill: 0 - Fluticasone-Umeclidin-Vilant (TRELEGY ELLIPTA) 100-62.5-25 MCG/ACT AEPB; TAKE 1 INHALATION INTO THE LUNGS BY MOUTH DAILY  Dispense: 60 each; Refill: 3 - clopidogrel (PLAVIX) 75 MG tablet; TAKE 1 TABLET(75 MG) BY MOUTH DAILY WITH BREAKFAST  Dispense: 90 tablet; Refill: 1     General Counseling: [name] [name] verbalizes understanding of the findings of todays visit and agrees with plan of treatment. I have discussed any further diagnostic  evaluation that may be needed or ordered today. We also reviewed his medications today. he has been encouraged to call the office with any questions or concerns that should arise related to todays visit.    Orders Placed This Encounter  Procedures   Hepatic function panel   CBC with Differential/Platelet   Lipid Profile   UA/M w/rflx Culture, Routine   B12 and Folate Panel   Ambulatory referral to Orthopedic Surgery   POCT glycosylated hemoglobin (Hb A1C)    Meds ordered this encounter  Medications   ibuprofen (ADVIL) 800 MG tablet    Sig: Take 1 tablet (800 mg total) by mouth every 8 (eight) hours as needed for moderate pain.    Dispense:  90 tablet    Refill:  1   HYDROcodone-acetaminophen (NORCO) 7.5-325 MG tablet    Sig: Take 1 tablet by mouth 2 (two) times daily as needed for moderate pain or severe pain.    Dispense:  60 tablet    Refill:  0    Note increased dose, not new med, just reordered with increase.   buPROPion (WELLBUTRIN SR) 150 MG 12 hr tablet    Sig: TAKE 1 TABLET(150 MG) BY MOUTH TWICE DAILY    Dispense:  180 tablet    Refill:  1   tamsulosin (FLOMAX) 0.4 MG CAPS capsule    Sig: Take 1 capsule (0.4 mg total) by mouth daily.    Dispense:  90 capsule    Refill:  1   traMADol (ULTRAM) 50 MG tablet    Sig: Take 1 tablet (50 mg total) by mouth every 6 (six) hours as needed for moderate pain or severe pain.    Dispense:  20 tablet    Refill:  0   Fluticasone-Umeclidin-Vilant (TRELEGY ELLIPTA) 100-62.5-25 MCG/ACT AEPB    Sig: TAKE 1 INHALATION INTO THE LUNGS BY MOUTH DAILY    Dispense:  60 each    Refill:  3   clopidogrel (PLAVIX) 75 MG tablet    Sig: TAKE 1 TABLET(75 MG) BY MOUTH DAILY WITH BREAKFAST    Dispense:  90 tablet    Refill:  1    Return in about 3 months (around 12/09/2022) for F/U, Donesha Wallander PCP.   Total time spent:30 Minutes Time spent includes review of chart, medications, test results, and follow up plan with the patient.   [place] Controlled  Substance Database was reviewed by me.  This patient was seen by Jonetta Osgood, FNP-C in collaboration with Dr. Clayborn Bigness as a part of collaborative care agreement.  Nalda Shackleford R. Valetta Fuller, MSN, FNP-C Internal medicine

## 2022-09-09 NOTE — Telephone Encounter (Signed)
Awaiting 09/09/22 office notes for Orthopedic referral-[facility]

## 2022-09-09 NOTE — Addendum Note (Signed)
Addended by: Jonetta Osgood on: 09/09/2022 01:53 PM   Modules accepted: Orders

## 2022-09-10 LAB — LIPID PANEL
Chol/HDL Ratio: 2.8 ratio (ref 0.0–5.0)
Cholesterol, Total: 99 mg/dL — ABNORMAL LOW (ref 100–199)
HDL: 36 mg/dL — ABNORMAL LOW (ref >39)
LDL Chol Calc (NIH): 44 mg/dL (ref 0–99)
Triglycerides: 96 mg/dL (ref 0–149)
VLDL Cholesterol Cal: 19 mg/dL (ref 5–40)

## 2022-09-10 LAB — UA/M W/RFLX CULTURE, ROUTINE
Bilirubin, UA: NEGATIVE
Glucose, UA: NEGATIVE
Ketones, UA: NEGATIVE
Leukocytes,UA: NEGATIVE
Nitrite, UA: NEGATIVE
Protein,UA: NEGATIVE
RBC, UA: NEGATIVE
Specific Gravity, UA: 1.017 (ref 1.005–1.030)
Urobilinogen, Ur: 0.2 mg/dL (ref 0.2–1.0)
pH, UA: 5 (ref 5.0–7.5)

## 2022-09-10 LAB — CBC WITH DIFFERENTIAL/PLATELET
Basophils Absolute: 0 10*3/uL (ref 0.0–0.2)
Basos: 1 %
EOS (ABSOLUTE): 0.2 10*3/uL (ref 0.0–0.4)
Eos: 4 %
Hematocrit: 40.7 % (ref 37.5–51.0)
Hemoglobin: 14.1 g/dL (ref 13.0–17.7)
Immature Grans (Abs): 0 10*3/uL (ref 0.0–0.1)
Immature Granulocytes: 0 %
Lymphocytes Absolute: 1.5 10*3/uL (ref 0.7–3.1)
Lymphs: 27 %
MCH: 29.9 pg (ref 26.6–33.0)
MCHC: 34.6 g/dL (ref 31.5–35.7)
MCV: 86 fL (ref 79–97)
Monocytes Absolute: 0.6 10*3/uL (ref 0.1–0.9)
Monocytes: 11 %
Neutrophils Absolute: 3.1 10*3/uL (ref 1.4–7.0)
Neutrophils: 57 %
Platelets: 194 10*3/uL (ref 150–450)
RBC: 4.72 x10E6/uL (ref 4.14–5.80)
RDW: 12.8 % (ref 11.6–15.4)
WBC: 5.5 10*3/uL (ref 3.4–10.8)

## 2022-09-10 LAB — HEPATIC FUNCTION PANEL
ALT: 22 IU/L (ref 0–44)
AST: 22 IU/L (ref 0–40)
Albumin: 4.4 g/dL (ref 3.9–4.9)
Alkaline Phosphatase: 110 IU/L (ref 44–121)
Bilirubin Total: 0.4 mg/dL (ref 0.0–1.2)
Bilirubin, Direct: 0.14 mg/dL (ref 0.00–0.40)
Total Protein: 6.9 g/dL (ref 6.0–8.5)

## 2022-09-10 LAB — MICROSCOPIC EXAMINATION
Bacteria, UA: NONE SEEN
Casts: NONE SEEN /lpf
RBC, Urine: NONE SEEN /hpf (ref 0–2)

## 2022-09-10 LAB — B12 AND FOLATE PANEL
Folate: 7.7 ng/mL (ref >3.0)
Vitamin B-12: 600 pg/mL (ref 232–1245)

## 2022-09-10 IMAGING — CT CT ABD-PELV W/O CM
2 of 4 series · 15 of 46 positions shown, 17 images · non-contrast
Comparison: Abdominal sonogram done on 11/19/2020

CLINICAL DATA: Pain right lower quadrant, right flank pain



[Series 2: routine abd/pel wo · axial · 0.82mm/px · z∈[-978,-538]mm · 12 of 98 slices shown, 14 images]
[im 5/98  soft-tissue]
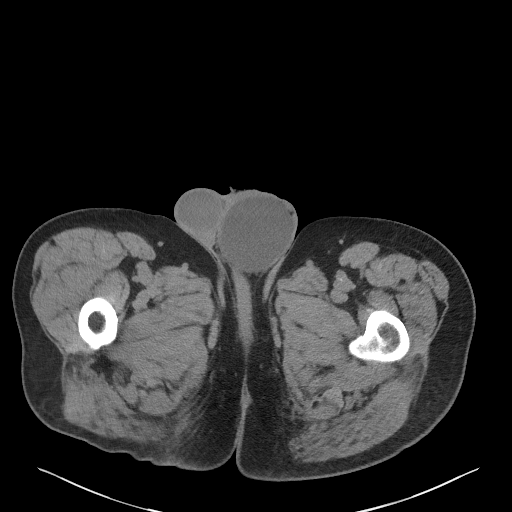
[im 5/98  bone]
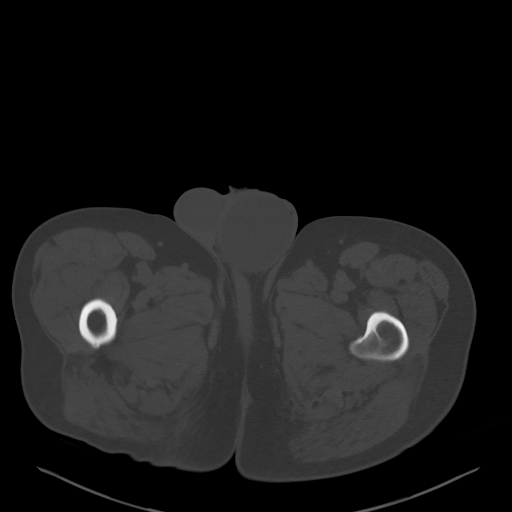
[im 13/98  soft-tissue]
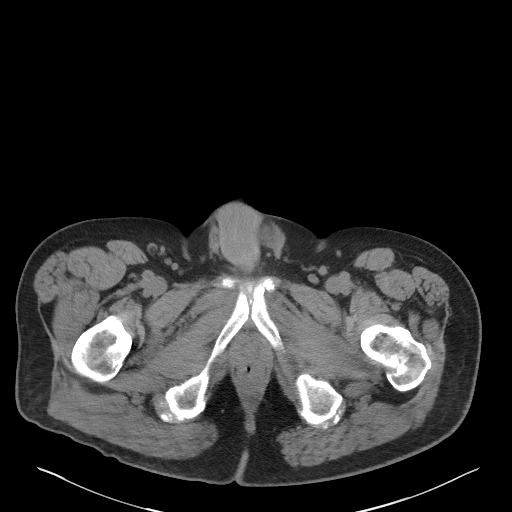
[im 22/98  soft-tissue]
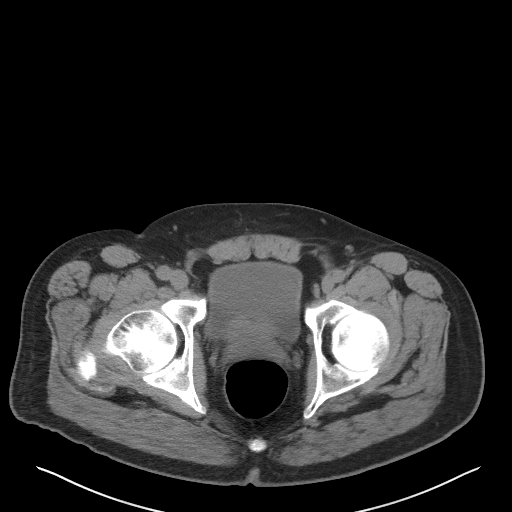
[im 30/98  soft-tissue]
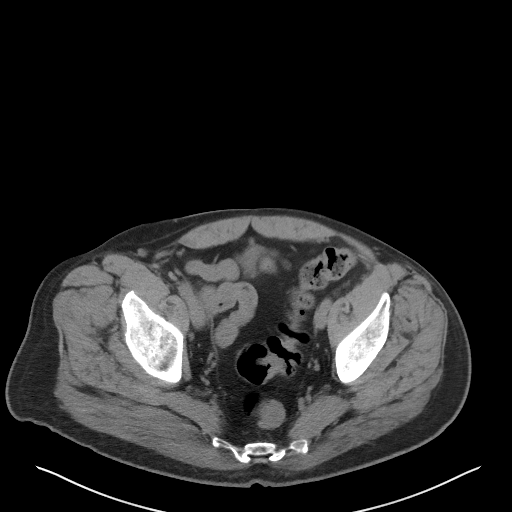
[im 38/98  soft-tissue]
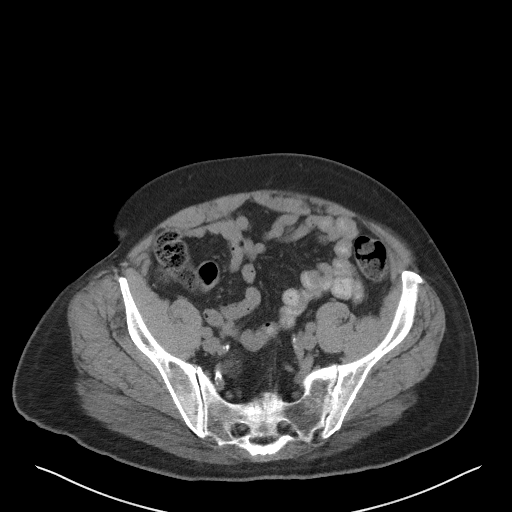
[im 47/98  soft-tissue]
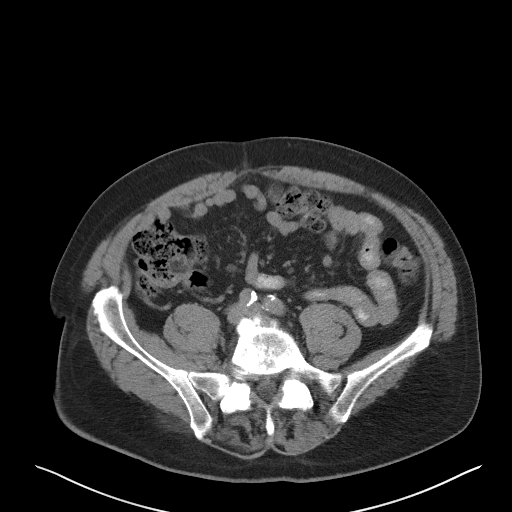
[im 51/98  soft-tissue]
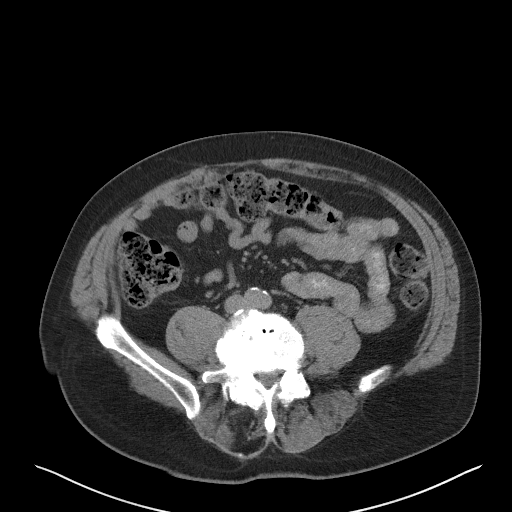
[im 60/98  soft-tissue]
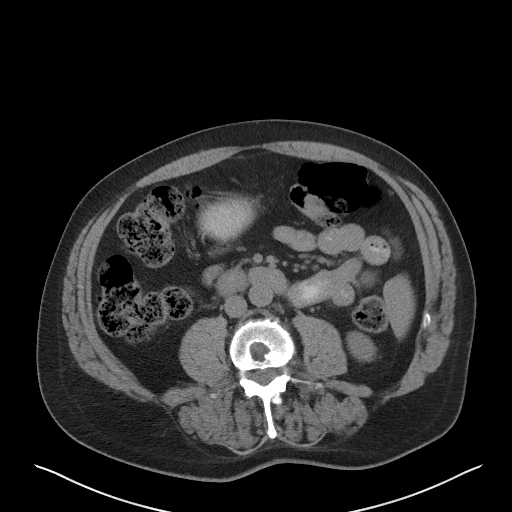
[im 68/98  soft-tissue]
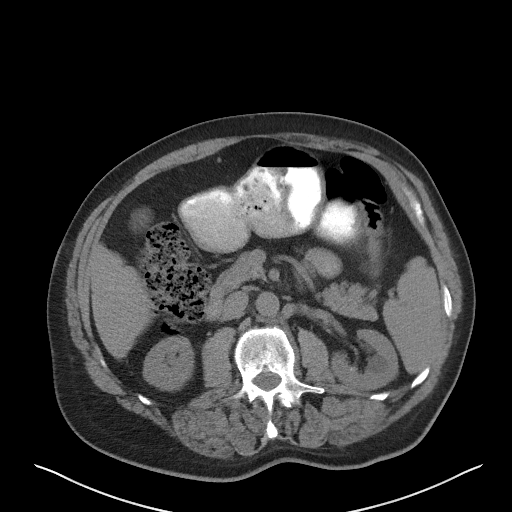
[im 68/98  bone]
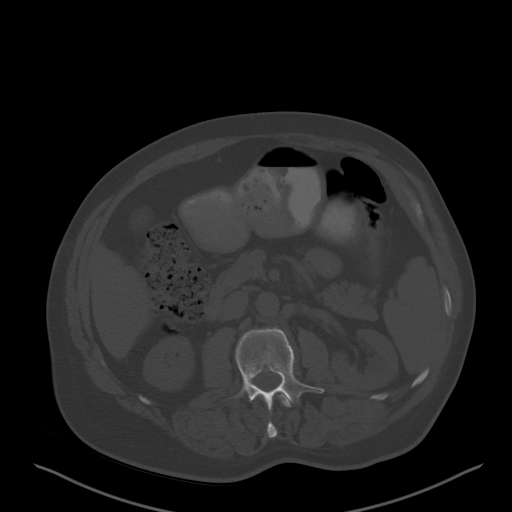
[im 76/98  soft-tissue]
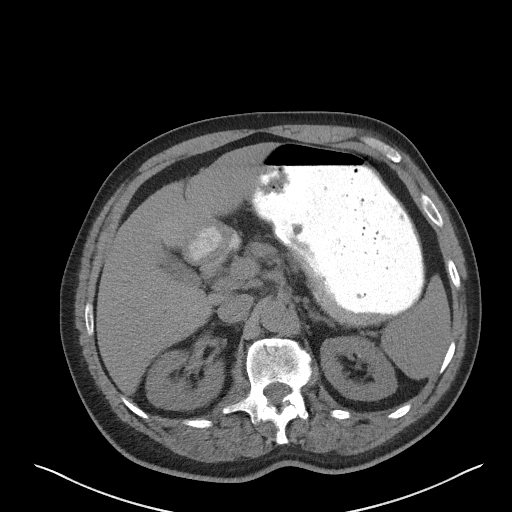
[im 85/98  soft-tissue]
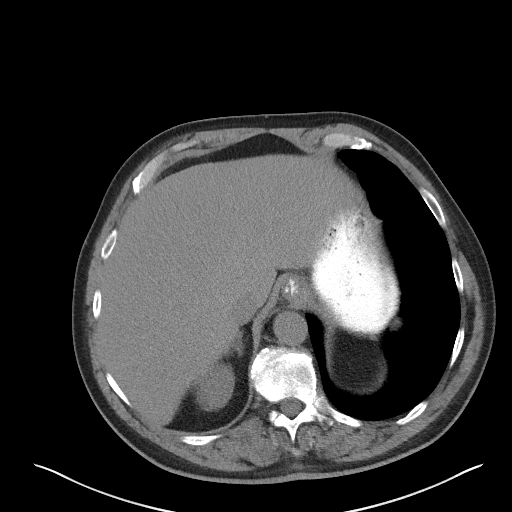
[im 93/98  soft-tissue]
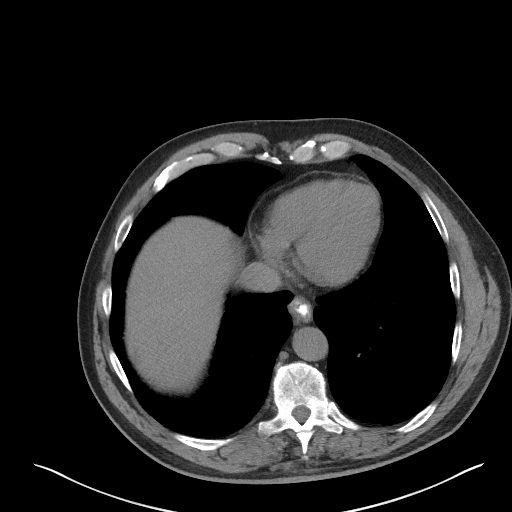

[Series 5: coronal st · coronal · 0.71mm/px · 3 of 102 slices shown]
[im 34/102  soft-tissue]
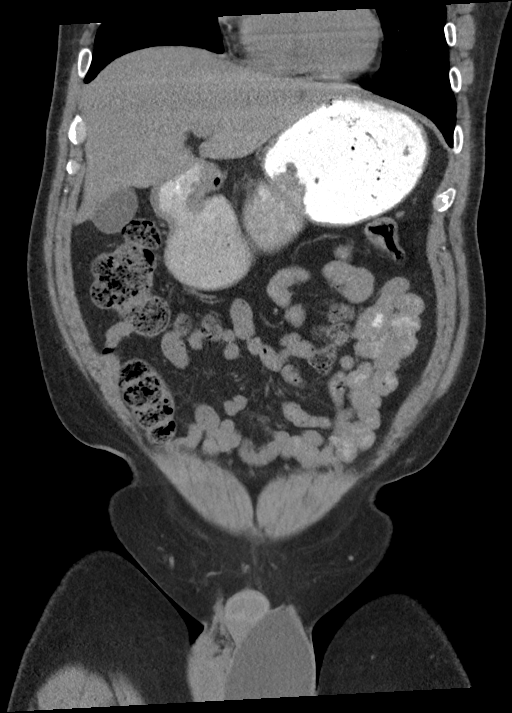
[im 45/102  soft-tissue]
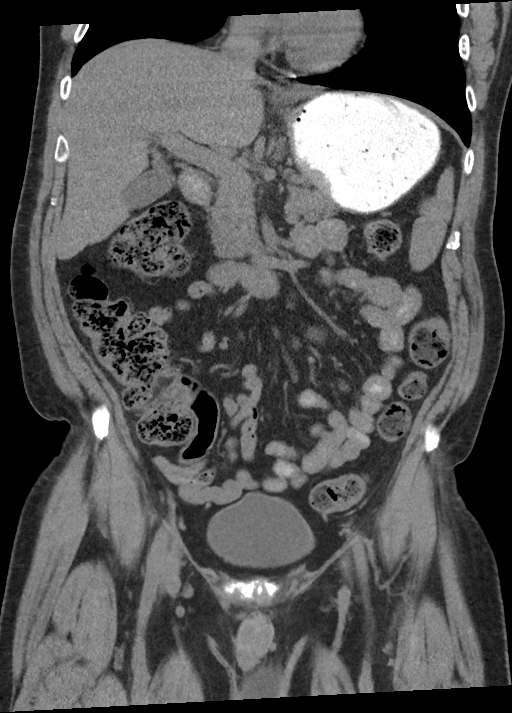
[im 57/102  soft-tissue]
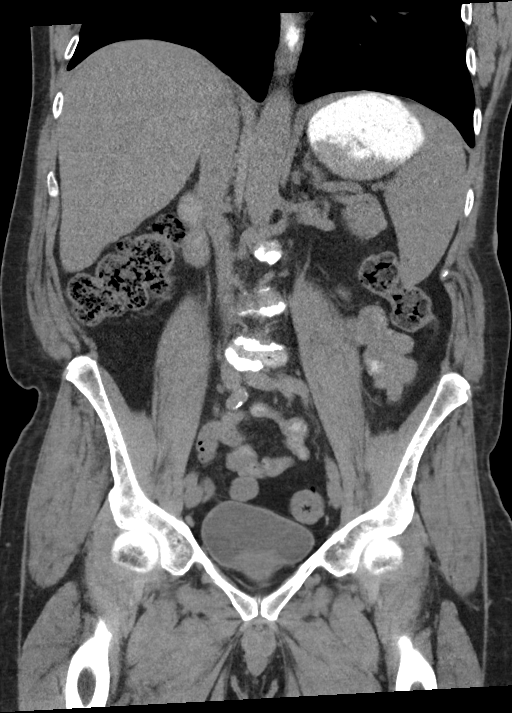

[15 of 46 positions shown; findings below may reference images not displayed]

FINDINGS: Lower chest: Scattered coronary artery calcifications are seen.
There is contrast in the lumen of visualized lower thoracic
esophagus suggesting gastroesophageal reflux.

Hepatobiliary: There is 8 mm ill-defined low-density in the left
lobe. There is no dilation of bile ducts. Gallbladder is
unremarkable.

Pancreas: No focal abnormality is seen.

Spleen: Unremarkable.

Adrenals/Urinary Tract: Adrenals are unremarkable. There are few
small punctate bilateral renal stones each measuring less than 2 mm.
There is no hydronephrosis. There is 1.4 cm low-density in the
midportion of left kidney, possibly a cyst. Ureters are not dilated.
Urinary bladder is unremarkable.

Stomach/Bowel: Stomach is moderately distended. Small bowel loops
are not dilated. Appendix is not dilated. There is no pericecal
inflammation. There is no significant wall thickening in the colon.
There is no pericolic stranding.

Vascular/Lymphatic: Left renal vein is retroaortic.

Reproductive: Prostate is enlarged. Large left hydrocele is seen.
Scrotum is not included in its entirety limiting evaluation.

Other: There is no ascites or pneumoperitoneum.

Musculoskeletal: Degenerative changes are noted in the lumbar spine
with disc space narrowing, bony spurs and endplate sclerosis. There
is encroachment of neural foramina at multiple levels. Schmorl's
nodes are seen in multiple lumbar vertebral bodies.
IMPRESSION: There is no evidence of intestinal obstruction or pneumoperitoneum.
Appendix is not dilated. There is no hydronephrosis.

Multiple small bilateral renal stones. Possible 1.4 cm left renal
cyst. There is 8 mm low-density in the left lobe of liver, possibly
cyst or hemangioma.

Gastroesophageal reflux.  Lumbar spondylosis.  Left hydrocele.

## 2022-09-22 ENCOUNTER — Telehealth: Payer: Self-pay | Admitting: Nurse Practitioner

## 2022-09-22 NOTE — Telephone Encounter (Signed)
Orthopedic appointment> 10/12/22 with [facility]

## 2022-09-22 NOTE — Telephone Encounter (Signed)
Orthopedic referral sent via [facility] to [facility]

## 2022-10-11 ENCOUNTER — Telehealth: Payer: Self-pay

## 2022-10-11 DIAGNOSIS — Z760 Encounter for issue of repeat prescription: Secondary | ICD-10-CM

## 2022-10-11 NOTE — Telephone Encounter (Signed)
Pt need this med to [employer] tetter

## 2022-10-12 ENCOUNTER — Encounter: Payer: Self-pay | Admitting: Nurse Practitioner

## 2022-10-12 ENCOUNTER — Telehealth (INDEPENDENT_AMBULATORY_CARE_PROVIDER_SITE_OTHER): Payer: Medicare Other | Admitting: Nurse Practitioner

## 2022-10-12 VITALS — Ht 72.0 in | Wt 205.0 lb

## 2022-10-12 DIAGNOSIS — J069 Acute upper respiratory infection, unspecified: Secondary | ICD-10-CM

## 2022-10-12 DIAGNOSIS — U071 COVID-19: Secondary | ICD-10-CM | POA: Diagnosis not present

## 2022-10-12 MED ORDER — MOLNUPIRAVIR 200 MG PO CAPS: 4.0000 | ORAL_CAPSULE | Freq: Two times a day (BID) | ORAL | 0 refills | Status: DC

## 2022-10-12 MED ORDER — HYDROCODONE-ACETAMINOPHEN 7.5-325 MG PO TABS: 1.0000 | ORAL_TABLET | Freq: Two times a day (BID) | ORAL | 0 refills | Status: DC | PRN

## 2022-10-12 MED ORDER — PAXLOVID (300/100) 20 X 150 MG & 10 X 100MG PO TBPK: 3.0000 | ORAL_TABLET | Freq: Two times a day (BID) | ORAL | 0 refills | Status: AC

## 2022-10-12 MED ORDER — AZITHROMYCIN 250 MG PO TABS: ORAL_TABLET | ORAL | 0 refills | Status: AC

## 2022-10-12 NOTE — Addendum Note (Signed)
Addended by: Jonetta Osgood on: 10/12/2022 02:10 PM   Modules accepted: Orders

## 2022-10-12 NOTE — Telephone Encounter (Signed)
Pt tested positive made him appt today

## 2022-10-12 NOTE — Progress Notes (Addendum)
[facility] [place], [place] [address]  Internal MEDICINE  Telephone Visit  Patient Name: [name] [name]  [id number]  [id number]  Date of Service: 10/12/2022  I connected with the patient at 1140 by telephone and verified the patients identity using two identifiers.   I discussed the limitations, risks, security and privacy concerns of performing an evaluation and management service by telephone and the availability of in person appointments. I also discussed with the patient that there may be a patient responsible charge related to the service.  The patient expressed understanding and agrees to proceed.    Chief Complaint  Patient presents with   Telephone Assessment    Covid positive    Telephone Screen   Cough   Sinusitis    HPI [name] [name] presents for a telehealth virtual visit for covid positive URI Symptoms include cough, nasal congestion, sinus pressure and runny Positive for covid today Symptoms started over the past 1-2 days  Exposed to covid last week possibly.    Current Medication: Outpatient Encounter Medications as of 10/12/2022  Medication Sig   albuterol (VENTOLIN HFA) 108 (90 Base) MCG/ACT inhaler Inhale 2 puffs into the lungs every 6 (six) hours as needed for wheezing or shortness of breath.   allopurinol (ZYLOPRIM) 100 MG tablet TAKE 2 TABLETS BY MOUTH DAILY   aspirin EC 81 MG EC tablet Take 1 tablet (81 mg total) by mouth daily. Swallow whole.   azithromycin (ZITHROMAX) 250 MG tablet Take 2 tablets on day 1, then 1 tablet daily on days 2 through 5   buPROPion (WELLBUTRIN SR) 150 MG 12 hr tablet TAKE 1 TABLET(150 MG) BY MOUTH TWICE DAILY   clopidogrel (PLAVIX) 75 MG tablet TAKE 1 TABLET(75 MG) BY MOUTH DAILY WITH BREAKFAST   DULoxetine (CYMBALTA) 30 MG capsule TAKE 1 CAPSULE(30 MG) BY MOUTH DAILY   Fluticasone-Umeclidin-Vilant (TRELEGY ELLIPTA) 100-62.5-25 MCG/ACT AEPB TAKE 1 INHALATION INTO THE LUNGS BY MOUTH DAILY    HYDROcodone-acetaminophen (NORCO) 7.5-325 MG tablet Take 1 tablet by mouth 2 (two) times daily as needed for moderate pain or severe pain.   ibuprofen (ADVIL) 800 MG tablet Take 1 tablet (800 mg total) by mouth every 8 (eight) hours as needed for moderate pain.   molnupiravir EUA (LAGEVRIO) 200 MG CAPS capsule Take 4 capsules (800 mg total) by mouth 2 (two) times daily for 5 days.   sildenafil (VIAGRA) 100 MG tablet Take 1 tablet (100 mg total) by mouth daily as needed for erectile dysfunction.   tamsulosin (FLOMAX) 0.4 MG CAPS capsule Take 1 capsule (0.4 mg total) by mouth daily.   tolterodine (DETROL LA) 4 MG 24 hr capsule TAKE 1 CAPSULE(4 MG) BY MOUTH DAILY   traMADol (ULTRAM) 50 MG tablet Take 1 tablet (50 mg total) by mouth every 6 (six) hours as needed for moderate pain or severe pain.   triamcinolone ointment (KENALOG) 0.5 % Apply 1 Application topically 2 (two) times daily. To affected area until resolved.   atorvastatin (LIPITOR) 80 MG tablet Take 1 tablet (80 mg total) by mouth daily.   gabapentin (NEURONTIN) 100 MG capsule Take 2 capsules (200 mg total) by mouth 2 (two) times daily.   No facility-administered encounter medications on file as of 10/12/2022.    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    Medical History: Past Medical History:  Diagnosis Date   Arthritis    COPD (chronic obstructive pulmonary disease) ([place])    Depression    Emphysema of lung ([facility])    Gout    left  knee   Heart attack ([place])    Kidney stones    bilateral    Family History: Family History  Problem Relation Age of Onset   Alcohol abuse Mother    Alcohol abuse Father    Alcohol abuse Brother     Social History   Socioeconomic History   Marital status: Married    Spouse name: Not on file   Number of children: Not on file   Years of education: Not on file   Highest education level: Not on file  Occupational History   Not on file  Tobacco Use   Smoking status: Every Day    Packs/day: 2.00    Years: 52.00    Total pack years: 104.00    Types: Cigarettes   Smokeless tobacco: Never   Tobacco comments:    1/2 pack daily  Vaping Use   Vaping Use: Never used  Substance and Sexual Activity   Alcohol use: Not Currently   Drug use: Never   Sexual activity: Not on file  Other Topics Concern   Not on file  Social History Narrative   Not on file   Social Determinants of Health   Financial Resource Strain: Low Risk  (08/10/2021)   Overall Financial Resource Strain (CARDIA)    Difficulty of Paying Living Expenses: Not hard at all  Food Insecurity: Not on file  Transportation Needs: Not on file  Physical Activity: Not on file  Stress: Not on file  Social Connections: Not on file  Intimate Partner Violence: Not on file      Review of Systems  Constitutional:  Positive for fatigue.  HENT:  Positive for congestion, postnasal drip, rhinorrhea, sinus pressure, sinus pain and sore throat.   Respiratory:  Positive for cough. Negative for chest tightness, shortness of breath and wheezing.   Cardiovascular: Negative.  Negative for chest pain and palpitations.  Gastrointestinal:  Negative for diarrhea, nausea and vomiting.  Neurological:  Positive for headaches.    Vital Signs: Ht 6' (1.829 m)   Wt 205 lb (93 kg)   BMI 27.80 kg/m    Observation/Objective: He is alert and oriented and engages in conversation appropriately. No acute distress noted.      Assessment/Plan: 1. Upper respiratory tract infection due to COVID-19 virus Antiviral medication prescribed.-- molnupiravir not covered by insurance, sent paxlovid instead - molnupiravir EUA (LAGEVRIO) 200 MG CAPS capsule; Take 4 capsules (800 mg total) by mouth 2 (two) times daily for 5 days.  Dispense: 40 capsule; Refill: 0   General Counseling: Domnique verbalizes understanding of the findings of today's phone visit and agrees with plan of treatment. I have discussed any further diagnostic evaluation that may be needed or ordered today. We also reviewed his medications today. he has been encouraged to call the office with any questions or concerns that should arise related to todays visit.  Return if symptoms worsen or fail to improve.   No orders of the defined types were placed  in this encounter.   Meds ordered this encounter  Medications   molnupiravir EUA (LAGEVRIO) 200 MG CAPS capsule    Sig: Take 4 capsules (800 mg total) by mouth 2 (two) times daily for 5 days.    Dispense:  40 capsule    Refill:  0    Time spent:10 Minutes Time spent with patient included reviewing progress notes, labs, imaging studies, and discussing plan for follow up.  [place] Controlled Substance Database was reviewed by me for overdose risk score (ORS) if appropriate.  This patient was seen by Jonetta Osgood, FNP-C in collaboration with Dr. Clayborn Bigness as a part of collaborative care agreement.  Aaliyan Brinkmeier R. Valetta Fuller, MSN, FNP-C Internal medicine

## 2022-10-15 ENCOUNTER — Ambulatory Visit: Payer: Medicare Other | Admitting: Urology

## 2022-10-27 NOTE — Progress Notes (Signed)
Please let patient know his lab results Cholesterol panel has a couple of abnormals but they are not concerning, overall his cholesterol looks great and his risk of cardiovascular disease is decreased because his cholesterol is good.  The rest of his labs are normal.

## 2022-10-28 ENCOUNTER — Other Ambulatory Visit: Payer: Self-pay | Admitting: Cardiovascular Disease

## 2022-11-02 DIAGNOSIS — M65332 Trigger finger, left middle finger: Secondary | ICD-10-CM | POA: Diagnosis not present

## 2022-11-02 DIAGNOSIS — M65331 Trigger finger, right middle finger: Secondary | ICD-10-CM | POA: Diagnosis not present

## 2022-11-05 ENCOUNTER — Ambulatory Visit: Payer: Medicare Other | Admitting: Urology

## 2022-11-05 ENCOUNTER — Ambulatory Visit
Admission: RE | Admit: 2022-11-05 | Discharge: 2022-11-05 | Disposition: A | Discharge status: Home / Self Care | Payer: Medicare Other | Source: Ambulatory Visit | Attending: Urology | Admitting: Urology

## 2022-11-05 ENCOUNTER — Ambulatory Visit
Admission: RE | Admit: 2022-11-05 | Discharge: 2022-11-05 | Disposition: A | Discharge status: Home / Self Care | Payer: Medicare Other | Attending: Urology | Admitting: Urology

## 2022-11-05 VITALS — BP 109/72 | HR 65 | Ht 72.0 in | Wt 206.0 lb

## 2022-11-05 DIAGNOSIS — N200 Calculus of kidney: Secondary | ICD-10-CM | POA: Diagnosis not present

## 2022-11-05 DIAGNOSIS — N5082 Scrotal pain: Secondary | ICD-10-CM | POA: Diagnosis not present

## 2022-11-05 DIAGNOSIS — M419 Scoliosis, unspecified: Secondary | ICD-10-CM | POA: Diagnosis not present

## 2022-11-05 DIAGNOSIS — M47816 Spondylosis without myelopathy or radiculopathy, lumbar region: Secondary | ICD-10-CM | POA: Diagnosis not present

## 2022-11-05 NOTE — Progress Notes (Signed)
11/05/2022 12:12 PM   [name] [name] 1956-02-14 [id number]  Referring provider: Jonetta Osgood, NP [address] [place],  [place] [address]  Chief Complaint  Patient presents with   Follow-up    Urologic history: 1.  Prostate cancer Fusion biopsy 03/2020 with Gleason 3+3 adenocarcinoma (5%) in the ROI lesion Question of extracapsular extension on MRI and was seen at [facility] for second opinion Repeat fusion confirmatory biopsy performed 10/15/2020 with pathology confirming very low risk disease and he elected active surveillance. Has continued follow-up at [facility] for active surveillance  2.  Bilateral nephrolithiasis CT 02/2022 with punctate bilateral nonobstructing renal calculi Elected metabolic evaluation which was remarkable for low urine volume at 1.3 L; mild hypocitraturia/hyperoxaluria  HPI: 67 y.o. male previous follow-up for follow-up of nephrolithiasis.  Doing well since last visit No bothersome LUTS Denies dysuria, gross hematuria Denies flank, abdominal or pelvic pain Still complains of burning sensation right hemiscrotum which we discussed at last visit is most likely neuropathic pain from his degenerative disc disease Last [facility] visit 05/31/2022   PMH: Past Medical History:  Diagnosis Date   Arthritis    COPD (chronic obstructive pulmonary disease) ([place])    Depression    Emphysema of lung ([place])    Gout    left knee   Heart attack ([place])    Kidney stones    bilateral    Surgical History: Past Surgical History:  Procedure Laterality Date   CARDIAC CATHETERIZATION     COLONOSCOPY WITH PROPOFOL N/A 04/07/2021   Procedure: COLONOSCOPY WITH PROPOFOL;  Surgeon: Virgel Manifold, MD;  Location: [facility] ENDOS[facility];  Service: Endoscopy;  Laterality: N/A;   LEFT HEART CATH AND CORONARY ANGIOGRAPHY N/A 05/18/2021   Procedure: LEFT HEART CATH AND CORONARY ANGIOGRAPHY;  Surgeon: Wellington Hampshire, MD;  Location: [facility];  Service: Cardiovascular;   Laterality: N/A;   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 If you have any questions, ask your nurse or doctor.          albuterol 108 (90 Base) MCG/ACT inhaler Commonly known as: VENTOLIN HFA Inhale 2 puffs into the lungs every 6 (six) hours as needed for wheezing or shortness of breath.   allopurinol 100 MG tablet Commonly known as: ZYLOPRIM Take 2 tablets by mouth daily.   allopurinol 100 MG tablet Commonly known as: ZYLOPRIM TAKE 2 TABLETS BY MOUTH DAILY   aspirin EC 81 MG tablet Take 1 tablet (81 mg total) by mouth daily. Swallow whole.   atorvastatin 80 MG tablet Commonly known as: LIPITOR TAKE 1 TABLET(80 MG) BY MOUTH DAILY   buPROPion 150 MG 24 hr tablet Commonly known as: WELLBUTRIN XL   buPROPion 150 MG 12 hr tablet Commonly known as: WELLBUTRIN SR TAKE 1 TABLET(150 MG) BY MOUTH TWICE DAILY   clopidogrel 75 MG tablet Commonly known as: PLAVIX TAKE 1 TABLET(75 MG) BY MOUTH DAILY WITH BREAKFAST   colchicine 0.6 MG tablet   cyclobenzaprine 10 MG tablet Commonly known as: FLEXERIL Take 1 tablet by mouth 3 (three) times daily.   DULoxetine 30 MG capsule Commonly known as: CYMBALTA TAKE 1 CAPSULE(30 MG) BY MOUTH DAILY   gabapentin 100 MG capsule Commonly known as: NEURONTIN   gabapentin 100 MG capsule Commonly known as: NEURONTIN Take 2 capsules (200 mg total) by  mouth 2 (two) times daily.   HYDROcodone-acetaminophen 7.5-325 MG tablet Commonly known as: NORCO Take 1 tablet by mouth 2 (two) times daily as needed for moderate pain or severe pain.   ibuprofen 800 MG tablet Commonly known as: ADVIL Take 1 tablet by mouth 3 (three) times daily.   ibuprofen 800 MG tablet Commonly known as: ADVIL Take 1 tablet (800 mg total) by mouth every 8 (eight) hours as needed for moderate pain.   indomethacin 50 MG  capsule Commonly known as: INDOCIN Take 1 capsule by mouth at bedtime.   meloxicam 15 MG tablet Commonly known as: MOBIC Take 1 tablet every day by oral route.   naproxen sodium 550 MG tablet Commonly known as: ANAPROX Take 1 tablet by mouth every 12 (twelve) hours as needed.   oxyCODONE-acetaminophen 7.5-325 MG tablet Commonly known as: PERCOCET Take 1 tablet by mouth every 4 (four) hours as needed.   sertraline 25 MG tablet Commonly known as: ZOLOFT   sildenafil 100 MG tablet Commonly known as: VIAGRA Take 1 tablet (100 mg total) by mouth daily as needed for erectile dysfunction.   tamsulosin 0.4 MG Caps capsule Commonly known as: FLOMAX Take 1 capsule (0.4 mg total) by mouth daily.   tiZANidine 4 MG tablet Commonly known as: ZANAFLEX TAKE 1 TABLET BY MOUTH EVERY 8 HOURS AS NEEDED FOR MS   tolterodine 4 MG 24 hr capsule Commonly known as: DETROL LA TAKE 1 CAPSULE(4 MG) BY MOUTH DAILY   traMADol 50 MG tablet Commonly known as: ULTRAM Take 1 tablet (50 mg total) by mouth every 6 (six) hours as needed for moderate pain or severe pain.   Trelegy Ellipta 100-62.5-25 MCG/ACT Aepb Generic drug: Fluticasone-Umeclidin-Vilant TAKE 1 INHALATION INTO THE LUNGS BY MOUTH DAILY   triamcinolone ointment 0.5 % Commonly known as: KENALOG Apply 1 Application topically 2 (two) times daily. To affected area until resolved.   varenicline 1 MG tablet Commonly known as: CHANTIX        Allergies: No Known Allergies  Family History: Family History  Problem Relation Age of Onset   Alcohol abuse Mother    Alcohol abuse Father    Alcohol abuse Brother     Social History:  reports that he has been smoking cigarettes. He has a 104.00 pack-year smoking history. He has never used smokeless tobacco. He reports that he does not currently use alcohol. He reports that he does not use drugs.   Physical Exam: BP 109/72   Pulse 65   Ht 6' (1.829 m)   Wt 206 lb (93.4 kg)   BMI 27.94  kg/m   Constitutional:  Alert and oriented, No acute distress. HEENT: [place] AT, moist mucus membranes.  Trachea midline, no masses. Cardiovascular: No clubbing, cyanosis, or edema. Respiratory: Normal respiratory effort, no increased work of breathing. GI: Abdomen is soft, nontender, nondistended, no abdominal masses GU: Right testis palpably normal without tenderness.  No evidence of hernia.  Moderate left hydrocele. Skin: No rashes, bruises or suspicious lesions. Neurologic: Grossly intact, no focal deficits, moving all 4 extremities. Psychiatric: Normal mood and affect.   Pertinent Imaging: Images of a KUB performed today were personally reviewed and interpreted.  There is a moderate amount of stool and bowel gas obscuring the renal outlines; possible punctate bilateral renal calculi noted   Assessment & Plan:    1.  Bilateral nephrolithiasis Punctate bilateral renal calculi which are asymptomatic He will follow-up as needed  2.  Right hemiscrotal pain Most likely neuropathic in  etiology  3.  Low risk prostate cancer Continue [facility] follow-up    Abbie Sons, [place] [address] [place], [place] [address] [phone]

## 2022-11-06 ENCOUNTER — Encounter: Payer: Self-pay | Admitting: Urology

## 2022-11-12 ENCOUNTER — Encounter: Payer: Self-pay | Admitting: Cardiovascular Disease

## 2022-11-12 ENCOUNTER — Ambulatory Visit: Payer: Medicare Other | Attending: Cardiovascular Disease | Admitting: Cardiovascular Disease

## 2022-11-12 VITALS — BP 100/64 | HR 62 | Ht 72.0 in | Wt 210.4 lb

## 2022-11-12 DIAGNOSIS — Z720 Tobacco use: Secondary | ICD-10-CM

## 2022-11-12 DIAGNOSIS — E785 Hyperlipidemia, unspecified: Secondary | ICD-10-CM

## 2022-11-12 DIAGNOSIS — I2510 Atherosclerotic heart disease of native coronary artery without angina pectoris: Secondary | ICD-10-CM | POA: Diagnosis not present
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
## 2022-11-12 NOTE — Patient Instructions (Signed)
Medication Instructions:  STOP the Clopidogrel (Plavix)  *If you need a refill on your cardiac medications before your next appointment, please call your pharmacy*   Lab Work: None ordered If you have labs (blood work) drawn today and your tests are completely normal, you will receive your results only by: [place] (if you have MyChart) OR A paper copy in the mail If you have any lab test that is abnormal or we need to change your treatment, we will call you to review the results.   Testing/Procedures: None ordered   Follow-Up: At [facility], you and your health needs are our priority.  As part of our continuing mission to provide you with exceptional heart care, we have created designated Provider Care Teams.  These Care Teams include your primary Cardiologist (physician) and Advanced Practice Providers (APPs -  Physician Assistants and Nurse Practitioners) who all work together to provide you with the care you need, when you need it.  We recommend signing up for the patient portal called "MyChart".  Sign up information is provided on this After Visit Summary.  MyChart is used to connect with patients for Virtual Visits (Telemedicine).  Patients are able to view lab/test results, encounter notes, upcoming appointments, etc.  Non-urgent messages can be sent to your provider as well.   To learn more about what you can do with MyChart, go to [email or web address].    Your next appointment:   12 month(s)  Provider:   You may see Dr. Fletcher Anon or one of the following Advanced Practice Providers on your designated Care Team:   Murray Hodgkins, NP Christell Faith, PA-C Cadence Kathlen Mody, PA-C Gerrie Nordmann, NP    Other Instructions Managing the Challenge of Quitting Smoking Quitting smoking is a physical and mental challenge. You may have cravings, withdrawal symptoms, and temptation to smoke. Before quitting, work with your health care provider to make a plan that can help you  manage quitting. Making a plan before you quit may keep you from smoking when you have the urge to smoke while trying to quit. How to manage lifestyle changes Managing stress Stress can make you want to smoke, and wanting to smoke may cause stress. It is important to find ways to manage your stress. You could try some of the following: Practice relaxation techniques. Breathe slowly and deeply, in through your nose and out through your mouth. Listen to music. Soak in a bath or take a shower. Imagine a peaceful place or vacation. Get some support. Talk with family or friends about your stress. Join a support group. Talk with a counselor or therapist. Get some physical activity. Go for a walk, run, or bike ride. Play a favorite sport. Practice yoga.  Medicines Talk with your health care provider about medicines that might help you deal with cravings and make quitting easier for you. Relationships Social situations can be difficult when you are quitting smoking. To manage this, you can: Avoid parties and other social situations where people might be smoking. Avoid alcohol. Leave right away if you have the urge to smoke. Explain to your family and friends that you are quitting smoking. Ask for support and let them know you might be a bit grumpy. Plan activities where smoking is not an option. General instructions Be aware that many people gain weight after they quit smoking. However, not everyone does. To keep from gaining weight, have a plan in place before you quit, and stick to the plan after you quit. Your plan  should include: Eating healthy snacks. When you have a craving, it may help to: Eat popcorn, or try carrots, celery, or other cut vegetables. Chew sugar-free gum. Changing how you eat. Eat small portion sizes at meals. Eat 4-6 small meals throughout the day instead of 1-2 large meals a day. Be mindful when you eat. You should avoid watching television or doing other things  that might distract you as you eat. Exercising regularly. Make time to exercise each day. If you do not have time for a long workout, do short bouts of exercise for 5-10 minutes several times a day. Do some form of strengthening exercise, such as weight lifting. Do some exercise that gets your heart beating and causes you to breathe deeply, such as walking fast, running, swimming, or biking. This is very important. Drinking plenty of water or other low-calorie or no-calorie drinks. Drink enough fluid to keep your urine pale yellow.  How to recognize withdrawal symptoms Your body and mind may experience discomfort as you try to get used to not having nicotine in your system. These effects are called withdrawal symptoms. They may include: Feeling hungrier than normal. Having trouble concentrating. Feeling irritable or restless. Having trouble sleeping. Feeling depressed. Craving a cigarette. These symptoms may surprise you, but they are normal to have when quitting smoking. To manage withdrawal symptoms: Avoid places, people, and activities that trigger your cravings. Remember why you want to quit. Get plenty of sleep. Avoid coffee and other drinks that contain caffeine. These may worsen some of your symptoms. How to manage cravings Come up with a plan for how to deal with your cravings. The plan should include the following: A definition of the specific situation you want to deal with. An activity or action you will take to replace smoking. A clear idea for how this action will help. The name of someone who could help you with this. Cravings usually last for 5-10 minutes. Consider taking the following actions to help you with your plan to deal with cravings: Keep your mouth busy. Chew sugar-free gum. Suck on hard candies or a straw. Brush your teeth. Keep your hands and body busy. Change to a different activity right away. Squeeze or play with a ball. Do an activity or a hobby, such  as making bead jewelry, practicing needlepoint, or working with wood. Mix up your normal routine. Take a short exercise break. Go for a quick walk, or run up and down stairs. Focus on doing something kind or helpful for someone else. Call a friend or family member to talk during a craving. Join a support group. Contact a quitline. Where to find support To get help or find a support group: Call the [facility]'s Smoking Quitline: 1-800-QUIT-NOW (754) 823-8598) Text QUIT to SmokefreeTXTAZ:4618977 Where to find more information Visit these websites to find more information on quitting smoking: U.S. Department of Health and Human Services: www.smokefree.gov American Lung Association: www.freedomfromsmoking.org Centers for Disease Control and Prevention (CDC): http://www.wolf.info/ American Heart Association: www.heart.org Contact a health care provider if: You want to change your plan for quitting. The medicines you are taking are not helping. Your eating feels out of control or you cannot sleep. You feel depressed or become very anxious. Summary Quitting smoking is a physical and mental challenge. You will face cravings, withdrawal symptoms, and temptation to smoke again. Preparation can help you as you go through these challenges. Try different techniques to manage stress, handle social situations, and prevent weight gain. You can deal  with cravings by keeping your mouth busy (such as by chewing gum), keeping your hands and body busy, calling family or friends, or contacting a quitline for people who want to quit smoking. You can deal with withdrawal symptoms by avoiding places where people smoke, getting plenty of rest, and avoiding drinks that contain caffeine. This information is not intended to replace advice given to you by your health care provider. Make sure you discuss any questions you have with your health care provider. Document Revised: 08/28/2021 Document Reviewed:  08/28/2021 Elsevier Patient Education  [place].

## 2022-11-22 ENCOUNTER — Telehealth: Payer: Self-pay

## 2022-11-22 DIAGNOSIS — Z760 Encounter for issue of repeat prescription: Secondary | ICD-10-CM

## 2022-12-01 MED ORDER — HYDROCODONE-ACETAMINOPHEN 7.5-325 MG PO TABS: 1.0000 | ORAL_TABLET | Freq: Two times a day (BID) | ORAL | 0 refills | Status: DC | PRN

## 2022-12-01 NOTE — Telephone Encounter (Signed)
Refill ordered

## 2022-12-09 ENCOUNTER — Encounter: Payer: Self-pay | Admitting: Nurse Practitioner

## 2022-12-09 ENCOUNTER — Ambulatory Visit (INDEPENDENT_AMBULATORY_CARE_PROVIDER_SITE_OTHER): Payer: Medicare Other | Admitting: Nurse Practitioner

## 2022-12-09 VITALS — BP 120/66 | HR 70 | Temp 98.2°F | Resp 16 | Ht 72.0 in | Wt 210.8 lb

## 2022-12-09 DIAGNOSIS — R350 Frequency of micturition: Secondary | ICD-10-CM

## 2022-12-09 DIAGNOSIS — M25562 Pain in left knee: Secondary | ICD-10-CM | POA: Diagnosis not present

## 2022-12-09 DIAGNOSIS — N401 Enlarged prostate with lower urinary tract symptoms: Secondary | ICD-10-CM

## 2022-12-09 DIAGNOSIS — J4489 Other specified chronic obstructive pulmonary disease: Secondary | ICD-10-CM

## 2022-12-09 DIAGNOSIS — F17219 Nicotine dependence, cigarettes, with unspecified nicotine-induced disorders: Secondary | ICD-10-CM

## 2022-12-09 DIAGNOSIS — Z760 Encounter for issue of repeat prescription: Secondary | ICD-10-CM

## 2022-12-09 DIAGNOSIS — G8929 Other chronic pain: Secondary | ICD-10-CM | POA: Diagnosis not present

## 2022-12-09 DIAGNOSIS — F320 Major depressive disorder, single episode, mild: Secondary | ICD-10-CM

## 2022-12-09 MED ORDER — TAMSULOSIN HCL 0.4 MG PO CAPS: 0.4000 mg | ORAL_CAPSULE | Freq: Every day | ORAL | 1 refills | Status: DC

## 2022-12-09 MED ORDER — TOLTERODINE TARTRATE ER 4 MG PO CP24: ORAL_CAPSULE | ORAL | 1 refills | Status: DC

## 2022-12-09 MED ORDER — TRELEGY ELLIPTA 100-62.5-25 MCG/ACT IN AEPB: INHALATION_SPRAY | RESPIRATORY_TRACT | 3 refills | Status: DC

## 2022-12-09 MED ORDER — DULOXETINE HCL 60 MG PO CPEP: 60.0000 mg | ORAL_CAPSULE | Freq: Every day | ORAL | 3 refills | Status: DC

## 2022-12-09 MED ORDER — HYDROCODONE-ACETAMINOPHEN 7.5-325 MG PO TABS: 1.0000 | ORAL_TABLET | Freq: Two times a day (BID) | ORAL | 0 refills | Status: DC | PRN

## 2022-12-09 MED ORDER — BUPROPION HCL ER (SR) 150 MG PO TB12: ORAL_TABLET | ORAL | 1 refills | Status: DC

## 2022-12-09 NOTE — Progress Notes (Signed)
[facility] [place], [place] [address]  Internal MEDICINE  Office Visit Note  Patient Name: [name] [name]  [id number]  [id number]  Date of Service: 12/09/2022  Chief Complaint  Patient presents with   Depression   Follow-up    HPI Rodgers presents for a follow-up visit for chronic pain, BPH, COPD and depression Chronic knee pain -- need refill of hydrocodone BPH -- takes tolterodine and tamsulosin, no issues COPD -- takes trelegy, doing well with this.  Depression -- takes bupropion and duloxetine, wants to increase duloxetine dose. Reports that he is not feeling very sad or depressed but has thoughts about death sometimes. Denies any thoughts of self harm or suicide, reports that the thoughts are vague. Denies any plan or intent      12/09/2022    1:24 PM  Depression screen PHQ 2/9  Decreased Interest 0  Down, Depressed, Hopeless 0  PHQ - 2 Score 0      Current Medication: Outpatient Encounter Medications as of 12/09/2022  Medication Sig   albuterol (VENTOLIN HFA) 108 (90 Base) MCG/ACT inhaler Inhale 2 puffs into the lungs every 6 (six) hours as needed for wheezing or shortness of breath.   allopurinol (ZYLOPRIM) 100 MG tablet TAKE 2 TABLETS BY MOUTH DAILY   aspirin EC 81 MG EC tablet Take 1 tablet (81 mg total) by mouth daily. Swallow whole.   atorvastatin (LIPITOR) 80 MG tablet TAKE 1 TABLET(80 MG) BY MOUTH DAILY   buPROPion (WELLBUTRIN XL) 150 MG 24 hr tablet    DULoxetine (CYMBALTA) 60 MG capsule Take 1 capsule (60 mg total) by mouth daily.   ibuprofen (ADVIL) 800 MG tablet Take 1 tablet (800 mg total) by mouth every 8 (eight) hours as needed for moderate pain.   ibuprofen (ADVIL) 800 MG tablet Take 1 tablet by mouth 3 (three) times daily.   sildenafil (VIAGRA) 100 MG tablet Take 1 tablet (100 mg total) by mouth daily as needed for erectile dysfunction.   tiZANidine (ZANAFLEX) 4 MG tablet TAKE 1 TABLET BY MOUTH EVERY 8 HOURS AS NEEDED FOR MS    [DISCONTINUED] DULoxetine (CYMBALTA) 30 MG capsule TAKE 1 CAPSULE(30 MG) BY MOUTH DAILY   [DISCONTINUED] Fluticasone-Umeclidin-Vilant (TRELEGY ELLIPTA) 100-62.5-25 MCG/ACT AEPB TAKE 1 INHALATION INTO THE LUNGS BY MOUTH DAILY   [DISCONTINUED] HYDROcodone-acetaminophen (NORCO) 7.5-325 MG tablet Take 1 tablet by mouth 2 (two) times daily as needed for moderate pain or severe pain.   [DISCONTINUED] tamsulosin (FLOMAX) 0.4 MG CAPS capsule Take 1 capsule (0.4 mg total) by mouth daily.   [DISCONTINUED] tolterodine (DETROL LA) 4 MG 24 hr capsule TAKE 1 CAPSULE(4 MG) BY MOUTH DAILY   buPROPion (WELLBUTRIN SR) 150 MG 12 hr tablet TAKE 1 TABLET(150 MG) BY MOUTH TWICE DAILY   Fluticasone-Umeclidin-Vilant (TRELEGY ELLIPTA) 100-62.5-25 MCG/ACT AEPB TAKE 1 INHALATION INTO THE LUNGS BY MOUTH DAILY   HYDROcodone-acetaminophen (NORCO) 7.5-325 MG tablet Take 1 tablet by mouth 2 (two) times daily as needed for moderate pain or severe pain.   tamsulosin (FLOMAX) 0.4 MG CAPS capsule Take 1 capsule (0.4 mg total) by mouth daily.   tolterodine (DETROL LA) 4 MG 24 hr capsule TAKE 1 CAPSULE(4 MG) BY MOUTH DAILY   No facility-administered encounter medications on file as of 12/09/2022.    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    Medical History: Past Medical History:  Diagnosis Date   Arthritis    COPD (chronic obstructive pulmonary disease) ([place])    Depression    Emphysema of lung ([place])    Gout    left knee    Heart attack ([place])    Kidney stones    bilateral    Family History: Family History  Problem Relation Age of Onset   Alcohol abuse Mother    Alcohol abuse Father    Alcohol abuse Brother     Social History   Socioeconomic History   Marital status: Married    Spouse name: Not on file   Number of children: Not on file   Years of education: Not on file   Highest education level: Not on file  Occupational History   Not on file  Tobacco Use   Smoking status: Every Day    Packs/day: 2.00    Years: 52.00    Additional pack years: 0.00    Total pack years: 104.00    Types: Cigarettes   Smokeless tobacco: Never   Tobacco comments:    1/2 pack daily  Vaping Use   Vaping Use: Never used  Substance and Sexual Activity   Alcohol use: Not Currently   Drug use: Never   Sexual activity: Not on file  Other Topics Concern   Not on file  Social History Narrative   Not on file   Social Determinants of Health   Financial Resource Strain: Low Risk  (08/10/2021)   Overall Financial Resource Strain (CARDIA)    Difficulty of Paying Living Expenses: Not hard at all  Food Insecurity: Not on file  Transportation Needs: Not on file  Physical Activity: Not on file  Stress: Not on file  Social Connections: Not on file  Intimate Partner Violence: Not on file      Review of Systems  Constitutional:  Negative for chills, fatigue and unexpected weight change.  HENT:  Negative for congestion, ear pain, hearing loss, rhinorrhea, sneezing and sore throat.   Respiratory: Negative.  Negative for cough, chest tightness, shortness of breath and wheezing.   Cardiovascular: Negative.  Negative for chest pain and palpitations.  Gastrointestinal: Negative.  Negative for abdominal pain, constipation, diarrhea, nausea and vomiting.  Genitourinary:  Negative for dysuria and frequency.  Musculoskeletal: Negative.  Negative for arthralgias, back pain, joint swelling and neck pain.  Skin:  Negative for  rash.  Neurological: Negative.  Negative for tremors and numbness.  Hematological:  Negative for adenopathy. Does not bruise/bleed easily.  Psychiatric/Behavioral:  Positive for behavioral problems (Depression). Negative for self-injury, sleep disturbance and suicidal ideas. The patient is nervous/anxious.     Vital Signs: BP 120/66   Pulse 70   Temp 98.2 F (36.8 C)   Resp 16   Ht 6' (1.829 m)   Wt 210 lb 12.8 oz (95.6 kg)   SpO2 97%   BMI 28.59 kg/m    Physical Exam Vitals reviewed.  Constitutional:      General: He is not in acute distress.    Appearance: Normal appearance. He is not ill-appearing.  HENT:     Head: Normocephalic and atraumatic.     Right Ear:  Tympanic membrane, ear canal and external ear normal.     Left Ear: Tympanic membrane, ear canal and external ear normal.  Eyes:     Pupils: Pupils are equal, round, and reactive to light.  Cardiovascular:     Rate and Rhythm: Normal rate and regular rhythm.  Pulmonary:     Effort: Pulmonary effort is normal. No respiratory distress.  Neurological:     Mental Status: He is alert and oriented to person, place, and time.  Psychiatric:        Mood and Affect: Mood normal.        Behavior: Behavior normal.        Assessment/Plan: 1. Obstructive chronic bronchitis without exacerbation Continue trelegy as prescribed - Fluticasone-Umeclidin-Vilant (TRELEGY ELLIPTA) 100-62.5-25 MCG/ACT AEPB; TAKE 1 INHALATION INTO THE LUNGS BY MOUTH DAILY  Dispense: 60 each; Refill: 3  2. Chronic pain of left knee Refill ordered, continue prn hydrocodone as prescribed - HYDROcodone-acetaminophen (NORCO) 7.5-325 MG tablet; Take 1 tablet by mouth 2 (two) times daily as needed for moderate pain or severe pain.  Dispense: 60 tablet; Refill: 0  3. Benign prostatic hyperplasia with urinary frequency Continue tolterodine and tamsulosin as prescribed - tolterodine (DETROL LA) 4 MG 24 hr capsule; TAKE 1 CAPSULE(4 MG) BY MOUTH DAILY   Dispense: 90 capsule; Refill: 1 - tamsulosin (FLOMAX) 0.4 MG CAPS capsule; Take 1 capsule (0.4 mg total) by mouth daily.  Dispense: 90 capsule; Refill: 1  4. Depression, major, single episode, mild (HCC) Continue bupropion as prescribed. Duloxetine dose increased to 60 mg daily - DULoxetine (CYMBALTA) 60 MG capsule; Take 1 capsule (60 mg total) by mouth daily.  Dispense: 90 capsule; Refill: 3 - buPROPion (WELLBUTRIN SR) 150 MG 12 hr tablet; TAKE 1 TABLET(150 MG) BY MOUTH TWICE DAILY  Dispense: 180 tablet; Refill: 1   General Counseling: Sven verbalizes understanding of the findings of todays visit and agrees with plan of treatment. I have discussed any further diagnostic evaluation that may be needed or ordered today. We also reviewed his medications today. he has been encouraged to call the office with any questions or concerns that should arise related to todays visit.    No orders of the defined types were placed in this encounter.   Meds ordered this encounter  Medications   DULoxetine (CYMBALTA) 60 MG capsule    Sig: Take 1 capsule (60 mg total) by mouth daily.    Dispense:  90 capsule    Refill:  3   Fluticasone-Umeclidin-Vilant (TRELEGY ELLIPTA) 100-62.5-25 MCG/ACT AEPB    Sig: TAKE 1 INHALATION INTO THE LUNGS BY MOUTH DAILY    Dispense:  60 each    Refill:  3   HYDROcodone-acetaminophen (NORCO) 7.5-325 MG tablet    Sig: Take 1 tablet by mouth 2 (two) times daily as needed for moderate pain or severe pain.    Dispense:  60 tablet    Refill:  0    For future refill   tolterodine (DETROL LA) 4 MG 24 hr capsule    Sig: TAKE 1 CAPSULE(4 MG) BY MOUTH DAILY    Dispense:  90 capsule    Refill:  1   tamsulosin (FLOMAX) 0.4 MG CAPS capsule    Sig: Take 1 capsule (0.4 mg total) by mouth daily.    Dispense:  90 capsule    Refill:  1   buPROPion (WELLBUTRIN SR) 150 MG 12 hr tablet    Sig: TAKE 1 TABLET(150 MG) BY MOUTH TWICE DAILY  Dispense:  180 tablet    Refill:  1    For  future refills    Return in about 3 months (around 03/04/2023) for F/U, Giuseppina Quinones PCP.   Total time spent:30 Minutes Time spent includes review of chart, medications, test results, and follow up plan with the patient.   [place] Controlled Substance Database was reviewed by me.  This patient was seen by Jonetta Osgood, FNP-C in collaboration with Dr. Clayborn Bigness as a part of collaborative care agreement.   Aaminah Forrester R. Valetta Fuller, MSN, FNP-C Internal medicine

## 2022-12-12 ENCOUNTER — Encounter: Payer: Self-pay | Admitting: Nurse Practitioner

## 2023-01-13 ENCOUNTER — Telehealth: Payer: Self-pay

## 2023-01-13 DIAGNOSIS — G8929 Other chronic pain: Secondary | ICD-10-CM

## 2023-01-14 MED ORDER — HYDROCODONE-ACETAMINOPHEN 7.5-325 MG PO TABS: 1.0000 | ORAL_TABLET | Freq: Two times a day (BID) | ORAL | 0 refills | Status: DC | PRN

## 2023-01-14 NOTE — Telephone Encounter (Signed)
Pt advised we send med  

## 2023-02-01 ENCOUNTER — Other Ambulatory Visit: Payer: Self-pay | Admitting: Nurse Practitioner

## 2023-02-01 DIAGNOSIS — N401 Enlarged prostate with lower urinary tract symptoms: Secondary | ICD-10-CM

## 2023-02-24 ENCOUNTER — Telehealth: Payer: Self-pay

## 2023-02-24 DIAGNOSIS — G8929 Other chronic pain: Secondary | ICD-10-CM

## 2023-02-28 ENCOUNTER — Other Ambulatory Visit: Payer: Self-pay | Admitting: Internal Medicine

## 2023-02-28 DIAGNOSIS — Z8739 Personal history of other diseases of the musculoskeletal system and connective tissue: Secondary | ICD-10-CM

## 2023-02-28 MED ORDER — HYDROCODONE-ACETAMINOPHEN 7.5-325 MG PO TABS
1.0000 | ORAL_TABLET | Freq: Two times a day (BID) | ORAL | 0 refills | Status: DC | PRN
Start: 2023-02-28 — End: 2023-03-04

## 2023-02-28 NOTE — Telephone Encounter (Signed)
Lmom we sent med

## 2023-03-04 ENCOUNTER — Ambulatory Visit (INDEPENDENT_AMBULATORY_CARE_PROVIDER_SITE_OTHER): Payer: Medicare Other | Admitting: Nurse Practitioner

## 2023-03-04 ENCOUNTER — Encounter: Payer: Self-pay | Admitting: Nurse Practitioner

## 2023-03-04 VITALS — BP 110/74 | HR 67 | Temp 98.1°F | Resp 16 | Ht 72.0 in | Wt 215.8 lb

## 2023-03-04 DIAGNOSIS — R350 Frequency of micturition: Secondary | ICD-10-CM | POA: Diagnosis not present

## 2023-03-04 DIAGNOSIS — G8929 Other chronic pain: Secondary | ICD-10-CM

## 2023-03-04 DIAGNOSIS — N401 Enlarged prostate with lower urinary tract symptoms: Secondary | ICD-10-CM | POA: Diagnosis not present

## 2023-03-04 DIAGNOSIS — J4489 Other specified chronic obstructive pulmonary disease: Secondary | ICD-10-CM | POA: Diagnosis not present

## 2023-03-04 DIAGNOSIS — F320 Major depressive disorder, single episode, mild: Secondary | ICD-10-CM

## 2023-03-04 DIAGNOSIS — M25562 Pain in left knee: Secondary | ICD-10-CM | POA: Diagnosis not present

## 2023-03-04 MED ORDER — HYDROCODONE-ACETAMINOPHEN 10-325 MG PO TABS
1.0000 | ORAL_TABLET | Freq: Two times a day (BID) | ORAL | 0 refills | Status: DC | PRN
Start: 2023-04-29 — End: 2023-08-08

## 2023-03-04 MED ORDER — HYDROCODONE-ACETAMINOPHEN 10-325 MG PO TABS
1.0000 | ORAL_TABLET | Freq: Two times a day (BID) | ORAL | 0 refills | Status: DC | PRN
Start: 2023-03-04 — End: 2023-08-08

## 2023-03-04 MED ORDER — HYDROCODONE-ACETAMINOPHEN 10-325 MG PO TABS
1.0000 | ORAL_TABLET | Freq: Two times a day (BID) | ORAL | 0 refills | Status: DC | PRN
Start: 2023-04-01 — End: 2023-08-08

## 2023-03-04 MED ORDER — IBUPROFEN 800 MG PO TABS
800.0000 mg | ORAL_TABLET | Freq: Three times a day (TID) | ORAL | 1 refills | Status: AC | PRN
Start: 2023-03-04 — End: ?

## 2023-03-04 NOTE — Progress Notes (Signed)
[facility] [address] [place], [place] [address]  Internal MEDICINE  Office Visit Note  Patient Name: [name] [name]  [id number]  [id number]  Date of Service: 03/04/2023  Chief Complaint  Patient presents with   Depression   Follow-up    Depression        Associated symptoms include no fatigue and no suicidal ideas.  [name] [name] presents for a follow-up visit for chronic knee pain, BPH, COPD, depression  Chronic knee pain -- need refill of hydrocodone, would like increase dose if possible. Also can take 800 mg ibuprofen as needed . BPH -- takes tolterodine and tamsulosin, urinating a lot esp at night, going to urologist in [facility]  COPD -- takes trelegy, doing well with this.  Depression -- takes bupropion and duloxetine, wants to increase duloxetine dose. Reports that he is not feeling very sad or depressed but has thoughts about death sometimes. Denies any thoughts of self harm or suicide, reports that the thoughts are vague. Denies any plan or intent    Current Medication: Outpatient Encounter Medications as of 03/04/2023  Medication Sig   albuterol (VENTOLIN HFA) 108 (90 Base) MCG/ACT inhaler Inhale 2 puffs into the lungs every 6 (six) hours as needed for wheezing or shortness of breath.   allopurinol (ZYLOPRIM) 100 MG tablet TAKE 2 TABLETS BY MOUTH DAILY   aspirin EC 81 MG EC tablet Take 1 tablet (81 mg total) by mouth daily. Swallow whole.   atorvastatin (LIPITOR) 80 MG tablet TAKE 1 TABLET(80 MG) BY MOUTH DAILY   buPROPion (WELLBUTRIN SR) 150 MG 12 hr tablet TAKE 1 TABLET(150 MG) BY MOUTH TWICE DAILY   DULoxetine (CYMBALTA) 60 MG capsule Take 1 capsule (60 mg total) by mouth daily.   Fluticasone-Umeclidin-Vilant (TRELEGY ELLIPTA) 100-62.5-25 MCG/ACT AEPB TAKE 1 INHALATION INTO THE LUNGS BY MOUTH DAILY   HYDROcodone-acetaminophen (NORCO) 10-325 MG tablet Take 1 tablet by mouth 2 (two) times daily as needed for moderate pain or severe pain.   [START ON 04/01/2023]  HYDROcodone-acetaminophen (NORCO) 10-325 MG tablet Take 1 tablet by mouth 2 (two) times daily as needed for moderate pain or severe pain.   [START ON 04/29/2023] HYDROcodone-acetaminophen (NORCO) 10-325 MG tablet Take 1 tablet by mouth 2 (two) times daily as needed for moderate pain or severe pain.   sildenafil (VIAGRA) 100 MG tablet Take 1 tablet (100 mg total) by mouth daily as needed for erectile dysfunction.   tamsulosin (FLOMAX) 0.4 MG CAPS capsule Take 1 capsule (0.4 mg total) by mouth daily.   tiZANidine (ZANAFLEX) 4 MG tablet TAKE 1 TABLET BY MOUTH EVERY 8 HOURS AS NEEDED FOR MS   tolterodine (DETROL LA) 4 MG 24 hr capsule TAKE 1 CAPSULE(4 MG) BY MOUTH DAILY   [DISCONTINUED] buPROPion (WELLBUTRIN XL) 150 MG 24 hr tablet    [DISCONTINUED] HYDROcodone-acetaminophen (NORCO) 7.5-325 MG tablet Take 1 tablet by mouth 2 (two) times daily as needed for moderate pain or severe pain.   [DISCONTINUED] ibuprofen (ADVIL) 800 MG tablet Take 1 tablet (800 mg total) by mouth every 8 (eight) hours as needed for moderate pain.   [DISCONTINUED] ibuprofen (ADVIL) 800 MG tablet Take 1 tablet by mouth 3 (three) times daily.   ibuprofen (ADVIL) 800 MG tablet Take 1 tablet (800 mg total) by mouth every 8 (eight) hours as needed for moderate pain.   No facility-administered encounter medications on file as of 03/04/2023.    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    Medical History: Past Medical History:   Diagnosis Date   Arthritis    COPD (chronic obstructive pulmonary disease) (HCC)    Depression    Emphysema of lung (HCC)    Gout    left knee   Heart attack (HCC)    Kidney stones    bilateral    Family History: Family History  Problem Relation Age of Onset   Alcohol abuse Mother    Alcohol abuse Father    Alcohol abuse Brother     Social History   Socioeconomic History   Marital status: Married    Spouse name: Not on file   Number of children: Not on file   Years of education: Not on file   Highest education level: Not on file  Occupational History   Not on file  Tobacco Use   Smoking status: Every Day    Packs/day: 2.00    Years: 52.00    Additional pack years: 0.00    Total pack years: 104.00    Types: Cigarettes   Smokeless tobacco: Never   Tobacco comments:    1/2 pack daily  Vaping Use   Vaping Use: Never used  Substance and Sexual Activity   Alcohol use: Not Currently   Drug use: Never   Sexual activity: Not on file  Other Topics Concern   Not on file  Social History Narrative   Not on file   Social Determinants of Health   Financial Resource Strain: Low Risk  (08/10/2021)   Overall Financial Resource Strain (CARDIA)    Difficulty of Paying Living Expenses: Not hard at all  Food Insecurity: Not on file  Transportation Needs: Not on file  Physical Activity: Not on file  Stress: Not on file  Social Connections: Not on file  Intimate Partner Violence: Not on file      Review of Systems  Constitutional:  Negative for chills, fatigue and unexpected weight change.  HENT:  Negative for congestion, ear pain, hearing loss, rhinorrhea, sneezing and sore throat.   Respiratory:  Positive for shortness of breath (intermittent). Negative for cough, chest tightness and wheezing.   Cardiovascular: Negative.  Negative for chest pain and palpitations.  Gastrointestinal: Negative.  Negative for abdominal pain, constipation, diarrhea, nausea and vomiting.   Endocrine: Positive for polyuria.  Genitourinary:  Positive for frequency and testicular pain (burning sensation, going to urologist in [facility] for this.). Negative for dysuria.  Musculoskeletal: Negative.  Negative for arthralgias, back pain, joint swelling and neck pain.  Skin:  Negative for rash.  Neurological: Negative.  Negative for tremors and numbness.  Hematological:  Negative for adenopathy. Does not bruise/bleed easily.  Psychiatric/Behavioral:  Positive for behavioral problems (Depression) and depression. Negative for self-injury, sleep disturbance and suicidal ideas. The patient is nervous/anxious.     Vital Signs: BP 110/74   Pulse 67   Temp 98.1 F (36.7 C)   Resp 16   Ht 6' (1.829 m)   Wt 215 lb 12.8 oz (97.9 kg)   SpO2 96%   BMI 29.27 kg/m  Physical Exam Vitals reviewed.  Constitutional:      General: He is not in acute distress.    Appearance: Normal appearance. He is not ill-appearing.  HENT:     Head: Normocephalic and atraumatic.     Right Ear: Tympanic membrane, ear canal and external ear normal.     Left Ear: Tympanic membrane, ear canal and external ear normal.  Eyes:     Pupils: Pupils are equal, round, and reactive to light.  Cardiovascular:     Rate and Rhythm: Normal rate and regular rhythm.  Pulmonary:     Effort: Pulmonary effort is normal. No respiratory distress.  Neurological:     Mental Status: He is alert and oriented to person, place, and time.  Psychiatric:        Mood and Affect: Mood normal.        Behavior: Behavior normal.        Assessment/Plan: 1. Chronic pain of left knee Continue prn hydrocodone and ibuprofen as prescribed. Hydrocodone dose is increased, UDS at next visit, instructed patient to come to the next visit with a full bladder - ibuprofen (ADVIL) 800 MG tablet; Take 1 tablet (800 mg total) by mouth every 8 (eight) hours as needed for moderate pain.  Dispense: 90 tablet; Refill: 1 - HYDROcodone-acetaminophen  (NORCO) 10-325 MG tablet; Take 1 tablet by mouth 2 (two) times daily as needed for moderate pain or severe pain.  Dispense: 60 tablet; Refill: 0 - HYDROcodone-acetaminophen (NORCO) 10-325 MG tablet; Take 1 tablet by mouth 2 (two) times daily as needed for moderate pain or severe pain.  Dispense: 60 tablet; Refill: 0 - HYDROcodone-acetaminophen (NORCO) 10-325 MG tablet; Take 1 tablet by mouth 2 (two) times daily as needed for moderate pain or severe pain.  Dispense: 60 tablet; Refill: 0  2. Obstructive chronic bronchitis without exacerbation Continue trelegy as prescribed.   3. Benign prostatic hyperplasia with urinary frequency Continue tolterodine and tamsulosin as prescribed.   4. Depression, major, single episode, mild (HCC) Continue bupropion and duloxetine as prescribed.    General Counseling: [name] [name] verbalizes understanding of the findings of todays visit and agrees with plan of treatment. I have discussed any further diagnostic evaluation that may be needed or ordered today. We also reviewed his medications today. he has been encouraged to call the office with any questions or concerns that should arise related to todays visit.    No orders of the defined types were placed in this encounter.   Meds ordered this encounter  Medications   ibuprofen (ADVIL) 800 MG tablet    Sig: Take 1 tablet (800 mg total) by mouth every 8 (eight) hours as needed for moderate pain.    Dispense:  90 tablet    Refill:  1   HYDROcodone-acetaminophen (NORCO) 10-325 MG tablet    Sig: Take 1 tablet by mouth 2 (two) times daily as needed for moderate pain or severe pain.    Dispense:  60 tablet    Refill:  0    Not a new medication, just an increase in dose. Please fill now   HYDROcodone-acetaminophen (NORCO) 10-325 MG tablet    Sig: Take 1 tablet by mouth 2 (two) times daily as needed for moderate pain or severe pain.    Dispense:  60 tablet    Refill:  0    Not a new medication, just an increase in  dose. Fill for july   HYDROcodone-acetaminophen (NORCO) 10-325 MG tablet    Sig: Take 1 tablet  by mouth 2 (two) times daily as needed for moderate pain or severe pain.    Dispense:  60 tablet    Refill:  0    Not a new medication, just an increase in dose. Fill for august    Return in about 12 weeks (around 05/27/2023) for F/U, pain med refill, [name] [name] PCP.   Total time spent:30 Minutes Time spent includes review of chart, medications, test results, and follow up plan with the patient.   [place] Controlled Substance Database was reviewed by me.  This patient was seen by Sallyanne Kuster, FNP-C in collaboration with Dr. Beverely Risen as a part of collaborative care agreement.   Steph Cheadle R. Tedd Sias, MSN, FNP-C Internal medicine

## 2023-03-07 ENCOUNTER — Other Ambulatory Visit: Payer: Self-pay | Admitting: Cardiovascular Disease

## 2023-04-26 DIAGNOSIS — R5383 Other fatigue: Secondary | ICD-10-CM | POA: Diagnosis not present

## 2023-05-04 ENCOUNTER — Telehealth: Payer: Self-pay

## 2023-05-05 NOTE — Telephone Encounter (Signed)
Lmom to call phar they already have pres at [facility]

## 2023-05-31 ENCOUNTER — Encounter: Payer: Self-pay | Admitting: Nurse Practitioner

## 2023-05-31 ENCOUNTER — Telehealth: Payer: Self-pay | Admitting: Nurse Practitioner

## 2023-05-31 ENCOUNTER — Ambulatory Visit (INDEPENDENT_AMBULATORY_CARE_PROVIDER_SITE_OTHER): Payer: Medicare Other | Admitting: Nurse Practitioner

## 2023-05-31 VITALS — BP 116/72 | HR 71 | Temp 98.1°F | Resp 16 | Ht 72.0 in | Wt 216.2 lb

## 2023-05-31 DIAGNOSIS — F17210 Nicotine dependence, cigarettes, uncomplicated: Secondary | ICD-10-CM | POA: Diagnosis not present

## 2023-05-31 DIAGNOSIS — Z79899 Other long term (current) drug therapy: Secondary | ICD-10-CM

## 2023-05-31 DIAGNOSIS — G8929 Other chronic pain: Secondary | ICD-10-CM | POA: Diagnosis not present

## 2023-05-31 DIAGNOSIS — N50811 Right testicular pain: Secondary | ICD-10-CM

## 2023-05-31 DIAGNOSIS — M545 Low back pain, unspecified: Secondary | ICD-10-CM

## 2023-05-31 DIAGNOSIS — H9113 Presbycusis, bilateral: Secondary | ICD-10-CM | POA: Diagnosis not present

## 2023-05-31 MED ORDER — VARENICLINE TARTRATE (STARTER) 0.5 MG X 11 & 1 MG X 42 PO TBPK
ORAL_TABLET | ORAL | 0 refills | Status: DC
Start: 2023-05-31 — End: 2023-06-29

## 2023-05-31 MED ORDER — TRELEGY ELLIPTA 100-62.5-25 MCG/ACT IN AEPB
INHALATION_SPRAY | RESPIRATORY_TRACT | 3 refills | Status: AC
Start: 2023-05-31 — End: ?

## 2023-05-31 MED ORDER — TOLTERODINE TARTRATE ER 4 MG PO CP24
ORAL_CAPSULE | ORAL | 1 refills | Status: DC
Start: 2023-05-31 — End: 2023-08-10

## 2023-05-31 MED ORDER — BUPROPION HCL ER (SR) 150 MG PO TB12
ORAL_TABLET | ORAL | 1 refills | Status: AC
Start: 2023-05-31 — End: ?

## 2023-05-31 MED ORDER — ALBUTEROL SULFATE HFA 108 (90 BASE) MCG/ACT IN AERS
2.0000 | INHALATION_SPRAY | Freq: Four times a day (QID) | RESPIRATORY_TRACT | 2 refills | Status: DC | PRN
Start: 2023-05-31 — End: 2023-07-05

## 2023-05-31 MED ORDER — TAMSULOSIN HCL 0.4 MG PO CAPS
0.4000 mg | ORAL_CAPSULE | Freq: Every day | ORAL | 1 refills | Status: DC
Start: 2023-05-31 — End: 2023-12-14

## 2023-05-31 NOTE — Progress Notes (Addendum)
[facility] [address] [place], [place] [address]  Internal MEDICINE  Office Visit Note  Patient Name: [name] [name]  [id number]  [id number]  Date of Service: 05/31/2023  Chief Complaint  Patient presents with   Depression   Follow-up    HPI [name] [name] presents for a follow-up visit for chronic pain, smoking cessation, hearing loss and PT.  Chronic pain --- low back pain, bilateral flank pain from kidney stones and now right testicular pain which his urologist informed him is radiating pain that originates in his lower back. His left knee also bothers him sometimes. Patient states during visit that he feels like he is getting tolerant of the pain medication hydrocodone and does not want anymore refills for now. States he needs to take a break and just deal with the pain so that he does not increase his tolerance anymore. He is taking prn ibuprofen sometimes and finding other things to do or ways to cope with the pain that will distract him. Smoking cessation -- has tried to quit smoking before and was on chantix. He had dropped down to 4-8 cigarettes but was not able to quit completely. He is motivated to quit and wants to try chantix again. Currently smokes 10-12 per day.  Hearing loss -- was asking about cleaning the wax out of his ears. ears are clear, no wax. May need to have hearing checked.  Urology recommended physical therapy to help with right testicular pain and low back pain.     Current Medication: Outpatient Encounter Medications as of 05/31/2023  Medication Sig   allopurinol (ZYLOPRIM) 100 MG tablet TAKE 2 TABLETS BY MOUTH DAILY   aspirin EC 81 MG EC tablet Take 1 tablet (81 mg total) by mouth daily. Swallow whole.   atorvastatin (LIPITOR) 80 MG tablet TAKE 1 TABLET(80 MG) BY MOUTH DAILY   DULoxetine (CYMBALTA) 60 MG capsule Take 1 capsule (60 mg total) by mouth daily.   HYDROcodone-acetaminophen (NORCO) 10-325 MG tablet Take 1 tablet by mouth 2 (two) times  daily as needed for moderate pain or severe pain.   HYDROcodone-acetaminophen (NORCO) 10-325 MG tablet Take 1 tablet by mouth 2 (two) times daily as needed for moderate pain or severe pain.   HYDROcodone-acetaminophen (NORCO) 10-325 MG tablet Take 1 tablet by mouth 2 (two) times daily as needed for moderate pain or severe pain.   ibuprofen (ADVIL) 800 MG tablet Take 1 tablet (800 mg total) by mouth every 8 (eight) hours as needed for moderate pain.   sildenafil (VIAGRA) 100 MG tablet Take 1 tablet (100 mg total) by mouth daily as needed for erectile dysfunction.   tiZANidine (ZANAFLEX) 4 MG tablet TAKE 1 TABLET BY MOUTH EVERY 8 HOURS AS NEEDED FOR MS   Varenicline Tartrate, Starter, 0.5 MG X 11 & 1 MG X 42 TBPK Take 0.5 mg by mouth once daily on days 1-3, then take 0.5 mg twice daily on days 4-7, then increase to 1 mg twice daily   [DISCONTINUED] albuterol (VENTOLIN HFA) 108 (90 Base) MCG/ACT inhaler Inhale 2 puffs into the lungs every 6 (six) hours as needed for wheezing or shortness of breath.   [DISCONTINUED] buPROPion (WELLBUTRIN SR) 150 MG 12 hr tablet TAKE 1 TABLET(150 MG) BY MOUTH TWICE DAILY   [DISCONTINUED] Fluticasone-Umeclidin-Vilant (TRELEGY ELLIPTA) 100-62.5-25 MCG/ACT AEPB TAKE 1 INHALATION INTO THE LUNGS BY MOUTH DAILY   [DISCONTINUED] tamsulosin (FLOMAX) 0.4 MG CAPS capsule Take 1 capsule (0.4 mg total) by mouth daily.   [DISCONTINUED] tolterodine (DETROL LA)  4 MG 24 hr capsule TAKE 1 CAPSULE(4 MG) BY MOUTH DAILY   albuterol (VENTOLIN HFA) 108 (90 Base) MCG/ACT inhaler Inhale 2 puffs into the lungs every 6 (six) hours as needed for wheezing or shortness of breath.   buPROPion (WELLBUTRIN SR) 150 MG 12 hr tablet TAKE 1 TABLET(150 MG) BY MOUTH TWICE DAILY   Fluticasone-Umeclidin-Vilant (TRELEGY ELLIPTA) 100-62.5-25 MCG/ACT AEPB TAKE 1 INHALATION INTO THE LUNGS BY MOUTH DAILY   tamsulosin (FLOMAX) 0.4 MG CAPS capsule Take 1 capsule (0.4 mg total) by mouth daily.   tolterodine (DETROL LA)  4 MG 24 hr capsule TAKE 1 CAPSULE(4 MG) BY MOUTH DAILY   No facility-administered encounter medications on file as of 05/31/2023.    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    Medical History: Past Medical History:  Diagnosis Date   Arthritis    COPD (chronic obstructive pulmonary disease) (HCC)    Depression    Emphysema of lung (HCC)    Gout    left knee   Heart attack (HCC)    Kidney stones    bilateral    Family History: Family History  Problem Relation Age of Onset   Alcohol abuse Mother    Alcohol abuse Father    Alcohol abuse Brother     Social History   Socioeconomic History   Marital status: Married    Spouse name: Not on file   Number of children: Not on file   Years of education: Not on file   Highest education level: Not on file  Occupational History   Not on file  Tobacco Use   Smoking status: Every Day    Current packs/day: 2.00    Average packs/day: 2.0 packs/day for 52.0 years (104.0 ttl pk-yrs)    Types: Cigarettes   Smokeless tobacco: Never   Tobacco comments:    1/2 pack daily  Vaping Use   Vaping status: Never Used  Substance and Sexual Activity   Alcohol use: Not Currently   Drug use: Never   Sexual activity: Not on file  Other Topics Concern   Not on file  Social History Narrative   Not on file   Social Determinants of Health   Financial Resource Strain: Low Risk  (08/10/2021)   Overall Financial Resource Strain (CARDIA)     Difficulty of Paying Living Expenses: Not hard at all  Food Insecurity: Not on file  Transportation Needs: Not on file  Physical Activity: Not on file  Stress: Not on file  Social Connections: Not on file  Intimate Partner Violence: Not on file      Review of Systems  Constitutional:  Negative for chills, fatigue and unexpected weight change.  HENT:  Negative for congestion, ear pain, hearing loss, rhinorrhea, sneezing and sore throat.   Respiratory:  Positive for shortness of breath (intermittent). Negative for cough, chest tightness and wheezing.   Cardiovascular: Negative.  Negative for chest pain and palpitations.  Gastrointestinal: Negative.  Negative for abdominal pain, constipation, diarrhea, nausea and vomiting.  Endocrine:  Positive for polyuria.  Genitourinary:  Positive for frequency and testicular pain (burning sensation, going to urologist in [facility] for this.). Negative for dysuria.  Musculoskeletal:  Positive for arthralgias and back pain. Negative for joint swelling and neck pain.  Skin:  Negative for rash.  Neurological: Negative.  Negative for tremors and numbness.  Hematological:  Negative for adenopathy. Does not bruise/bleed easily.  Psychiatric/Behavioral:  Positive for behavioral problems (Depression). Negative for self-injury, sleep disturbance and suicidal ideas. The patient is nervous/anxious.     Vital Signs: BP 116/72   Pulse 71   Temp 98.1 F (36.7 C)   Resp 16   Ht 6' (1.829 m)   Wt 216 lb 3.2 oz (98.1 kg)   SpO2 96%   BMI 29.32 kg/m    Physical Exam Vitals reviewed.  Constitutional:      General: He is not in acute distress.    Appearance: Normal appearance. He is not ill-appearing.  HENT:     Head: Normocephalic and atraumatic.     Right Ear: Tympanic membrane, ear canal and external ear normal.     Left Ear: Tympanic membrane, ear canal and external ear normal.  Eyes:     Pupils: Pupils are equal, round, and reactive to light.   Cardiovascular:     Rate and Rhythm: Normal rate and regular rhythm.  Pulmonary:     Effort: Pulmonary effort is normal. No respiratory distress.  Neurological:     Mental Status: He is alert and oriented to person, place, and time.  Psychiatric:        Mood and Affect: Mood normal.        Behavior: Behavior normal.        Assessment/Plan: 1. Presbycusis of both ears Ears are clean, needs to have hearing checked, referred to audiology at [facility] - Ambulatory referral to Audiology  2. Chronic bilateral low back pain without sciatica Referred to physical therapy  - Ambulatory referral to Physical Therapy  3. Right testicular pain Referred to physical therapy  - Ambulatory referral to Physical Therapy  4. Encounter for medication review Medication list reviewed, updated and refills ordered  - buPROPion (WELLBUTRIN SR) 150 MG 12 hr tablet; TAKE 1 TABLET(150 MG) BY MOUTH TWICE DAILY  Dispense: 180 tablet; Refill: 1 - Fluticasone-Umeclidin-Vilant (TRELEGY ELLIPTA) 100-62.5-25 MCG/ACT AEPB; TAKE 1 INHALATION INTO THE LUNGS BY MOUTH DAILY  Dispense: 60 each; Refill: 3 - tolterodine (DETROL LA) 4 MG 24 hr capsule; TAKE 1 CAPSULE(4 MG) BY MOUTH DAILY  Dispense: 90 capsule; Refill: 1 - tamsulosin (FLOMAX) 0.4 MG CAPS capsule; Take 1 capsule (0.4 mg total) by mouth daily.  Dispense: 90 capsule; Refill: 1 - albuterol (VENTOLIN HFA) 108 (90 Base) MCG/ACT inhaler; Inhale 2 puffs into the lungs every 6 (six) hours as needed for wheezing or shortness of breath.  Dispense: 8 g; Refill: 2  5. Smokes one pack per day or less and motivated to quit Chantix prescribed. May request refill if needed.  - Varenicline Tartrate, Starter, 0.5 MG X 11 & 1 MG X 42 TBPK; Take 0.5 mg by mouth once daily on days 1-3, then take 0.5 mg twice daily on days 4-7, then increase to 1 mg twice daily  Dispense: 53 each; Refill: 0   General Counseling: Crist verbalizes understanding of the findings of todays visit  and agrees with plan of treatment. I have discussed any further diagnostic evaluation that may be needed or ordered today. We also reviewed his medications today. he has  been encouraged to call the office with any questions or concerns that should arise related to todays visit.    Orders Placed This Encounter  Procedures   Ambulatory referral to Audiology   Ambulatory referral to Physical Therapy    Meds ordered this encounter  Medications   Varenicline Tartrate, Starter, 0.5 MG X 11 & 1 MG X 42 TBPK    Sig: Take 0.5 mg by mouth once daily on days 1-3, then take 0.5 mg twice daily on days 4-7, then increase to 1 mg twice daily    Dispense:  53 each    Refill:  0    Fil new script today   buPROPion (WELLBUTRIN SR) 150 MG 12 hr tablet    Sig: TAKE 1 TABLET(150 MG) BY MOUTH TWICE DAILY    Dispense:  180 tablet    Refill:  1    For future refills   Fluticasone-Umeclidin-Vilant (TRELEGY ELLIPTA) 100-62.5-25 MCG/ACT AEPB    Sig: TAKE 1 INHALATION INTO THE LUNGS BY MOUTH DAILY    Dispense:  60 each    Refill:  3   tolterodine (DETROL LA) 4 MG 24 hr capsule    Sig: TAKE 1 CAPSULE(4 MG) BY MOUTH DAILY    Dispense:  90 capsule    Refill:  1   tamsulosin (FLOMAX) 0.4 MG CAPS capsule    Sig: Take 1 capsule (0.4 mg total) by mouth daily.    Dispense:  90 capsule    Refill:  1   albuterol (VENTOLIN HFA) 108 (90 Base) MCG/ACT inhaler    Sig: Inhale 2 puffs into the lungs every 6 (six) hours as needed for wheezing or shortness of breath.    Dispense:  8 g    Refill:  2    Return for previously scheduled, CPE, Sophiamarie Nease PCP in december .   Total time spent:30 Minutes Time spent includes review of chart, medications, test results, and follow up plan with the patient.   [place] Controlled Substance Database was reviewed by me.  This patient was seen by Sallyanne Kuster, FNP-C in collaboration with Dr. Beverely Risen as a part of collaborative care agreement.   Steffie Waggoner R. Tedd Sias, MSN,  FNP-C Internal medicine

## 2023-05-31 NOTE — Telephone Encounter (Addendum)
Awaiting 05/31/23 office notes for PT & Audiology referral-Toni

## 2023-06-01 ENCOUNTER — Telehealth: Payer: Self-pay | Admitting: Nurse Practitioner

## 2023-06-01 NOTE — Telephone Encounter (Signed)
Audiology referral sent via [facility] to [facility]. Notified patient. Gave pt telephone # [phone]

## 2023-06-01 NOTE — Telephone Encounter (Signed)
PT referral sent via [facility] to [facility]. Notified patient. Gave pt telephone # [phone]

## 2023-06-11 ENCOUNTER — Other Ambulatory Visit: Payer: Self-pay | Admitting: Nurse Practitioner

## 2023-06-11 DIAGNOSIS — Z79899 Other long term (current) drug therapy: Secondary | ICD-10-CM

## 2023-06-23 ENCOUNTER — Telehealth: Payer: Self-pay | Admitting: Nurse Practitioner

## 2023-06-23 DIAGNOSIS — H903 Sensorineural hearing loss, bilateral: Secondary | ICD-10-CM | POA: Diagnosis not present

## 2023-06-23 NOTE — Telephone Encounter (Signed)
Audiology appointment 06/23/2023 @ [facility] -Sheralyn Boatman

## 2023-06-27 ENCOUNTER — Other Ambulatory Visit: Payer: Self-pay | Admitting: Nurse Practitioner

## 2023-06-27 DIAGNOSIS — F17210 Nicotine dependence, cigarettes, uncomplicated: Secondary | ICD-10-CM

## 2023-06-27 NOTE — Telephone Encounter (Signed)
Please review and send 

## 2023-07-05 ENCOUNTER — Ambulatory Visit: Payer: Medicare Other | Admitting: Internal Medicine

## 2023-07-05 ENCOUNTER — Encounter: Payer: Self-pay | Admitting: Internal Medicine

## 2023-07-05 VITALS — BP 109/70 | HR 70 | Temp 98.2°F | Resp 16 | Ht 72.0 in | Wt 219.2 lb

## 2023-07-05 DIAGNOSIS — F17210 Nicotine dependence, cigarettes, uncomplicated: Secondary | ICD-10-CM

## 2023-07-05 DIAGNOSIS — J4489 Other specified chronic obstructive pulmonary disease: Secondary | ICD-10-CM | POA: Diagnosis not present

## 2023-07-05 DIAGNOSIS — R0602 Shortness of breath: Secondary | ICD-10-CM | POA: Diagnosis not present

## 2023-07-05 DIAGNOSIS — Z79899 Other long term (current) drug therapy: Secondary | ICD-10-CM | POA: Diagnosis not present

## 2023-07-05 MED ORDER — ALBUTEROL SULFATE HFA 108 (90 BASE) MCG/ACT IN AERS: 2.0000 | INHALATION_SPRAY | Freq: Four times a day (QID) | RESPIRATORY_TRACT | 2 refills | Status: AC | PRN

## 2023-07-05 NOTE — Progress Notes (Signed)
[facility] [address] [place], [place] [address]  Pulmonary Sleep Medicine   Office Visit Note  Patient Name: [name] [name] DOB: 1956/07/22 MRN [id number]  Date of Service: 07/05/2023  Complaints/HPI: Doing Ok still smoking about 1/2PPD. Trying to stop smoking but patient has not not had much luck.  Does not.  Patient is got significant motivation.  I spoke to the patient regarding the findings of the pulmonary function.  Patient definitely will benefit from smoking cessation.  Patient has occasional cough and congestion.  Office Spirometry Results: Peak Flow: (!) 3 L/min FEV1: 2.36 liters FVC: 3.25 liters FEV1/FVC: 72.6 % FVC  % Predicted: 68 % FEV % Predicted: 67 % FeF 25-75: 1.66 liters FeF 25-75 % Predicted: 61   ROS  General: (-) fever, (-) chills, (-) night sweats, (-) weakness Skin: (-) rashes, (-) itching,. Eyes: (-) visual changes, (-) redness, (-) itching. Nose and Sinuses: (-) nasal stuffiness or itchiness, (-) postnasal drip, (-) nosebleeds, (-) sinus trouble. Mouth and Throat: (-) sore throat, (-) hoarseness. Neck: (-) swollen glands, (-) enlarged thyroid, (-) neck pain. Respiratory: - cough, (-) bloody sputum, + shortness of breath, - wheezing. Cardiovascular: - ankle swelling, (-) chest pain. Lymphatic: (-) lymph node enlargement. Neurologic: (-) numbness, (-) tingling. Psychiatric: (-) anxiety, (-) depression   Current Medication: Outpatient Encounter Medications as of 07/05/2023  Medication Sig   albuterol (VENTOLIN HFA) 108 (90 Base) MCG/ACT inhaler Inhale 2 puffs into the lungs every 6 (six) hours as needed for wheezing or shortness of breath.   allopurinol (ZYLOPRIM) 100 MG tablet TAKE 2 TABLETS BY MOUTH DAILY   aspirin EC 81 MG EC tablet Take 1 tablet (81 mg total) by mouth daily. Swallow whole.   atorvastatin (LIPITOR) 80 MG tablet TAKE 1 TABLET(80 MG) BY MOUTH DAILY   buPROPion (WELLBUTRIN SR) 150 MG 12 hr tablet TAKE 1  TABLET(150 MG) BY MOUTH TWICE DAILY   DULoxetine (CYMBALTA) 60 MG capsule Take 1 capsule (60 mg total) by mouth daily.   Fluticasone-Umeclidin-Vilant (TRELEGY ELLIPTA) 100-62.5-25 MCG/ACT AEPB TAKE 1 INHALATION INTO THE LUNGS BY MOUTH DAILY   HYDROcodone-acetaminophen (NORCO) 10-325 MG tablet Take 1 tablet by mouth 2 (two) times daily as needed for moderate pain or severe pain.   HYDROcodone-acetaminophen (NORCO) 10-325 MG tablet Take 1 tablet by mouth 2 (two) times daily as needed for moderate pain or severe pain.   HYDROcodone-acetaminophen (NORCO) 10-325 MG tablet Take 1 tablet by mouth 2 (two) times daily as needed for moderate pain or severe pain.   ibuprofen (ADVIL) 800 MG tablet Take 1 tablet (800 mg total) by mouth every 8 (eight) hours as needed for moderate pain.   sildenafil (VIAGRA) 100 MG tablet Take 1 tablet (100 mg total) by mouth daily as needed for erectile dysfunction.   tamsulosin (FLOMAX) 0.4 MG CAPS capsule Take 1 capsule (0.4 mg total) by mouth daily.   tiZANidine (ZANAFLEX) 4 MG tablet TAKE 1 TABLET BY MOUTH EVERY 8 HOURS AS NEEDED FOR MS   tolterodine (DETROL LA) 4 MG 24 hr capsule TAKE 1 CAPSULE(4 MG) BY MOUTH DAILY   varenicline (CHANTIX) 1 MG tablet Take 1 tablet (1 mg total) by mouth 2 (two) times daily.   No facility-administered encounter medications on file as of 07/05/2023.    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    Medical History: Past Medical History:   Diagnosis Date   Arthritis    COPD (chronic obstructive pulmonary disease) (HCC)    Depression    Emphysema of lung (HCC)    Gout    left knee   Heart attack (HCC)    Kidney stones    bilateral    Family History: Family History  Problem Relation Age of Onset   Alcohol abuse Mother    Alcohol abuse Father    Alcohol abuse Brother     Social History: Social History   Socioeconomic History   Marital status: Married    Spouse name: Not on file   Number of children: Not on file   Years of education: Not on file   Highest education level: Not on file  Occupational History   Not on file  Tobacco Use   Smoking status: Every Day    Current packs/day: 2.00    Average packs/day: 2.0 packs/day for 52.0 years (104.0 ttl pk-yrs)    Types: Cigarettes   Smokeless tobacco: Never   Tobacco comments:    1/2 pack daily  Vaping Use   Vaping status: Never Used  Substance and Sexual Activity   Alcohol use: Not Currently   Drug use: Never   Sexual activity: Not on file  Other Topics Concern   Not on file  Social History Narrative   Not on file   Social Determinants of Health   Financial Resource Strain: Low Risk  (08/10/2021)   Overall Financial Resource Strain (CARDIA)    Difficulty of Paying Living Expenses: Not hard at all  Food Insecurity: Not on file  Transportation Needs: Not on file  Physical Activity: Not on file  Stress: Not on file  Social Connections: Not on file  Intimate Partner Violence: Not on file    Vital Signs: Blood pressure 109/70, pulse 70, temperature 98.2 F (36.8 C), resp. rate 16, height 6' (1.829 m), weight 219 lb 3.2 oz (99.4 kg), SpO2 97%, peak flow (!) 3 L/min.  Examination: General Appearance: The patient is well-developed, well-nourished, and in no distress. Skin: Gross inspection of skin unremarkable. Head: normocephalic, no gross deformities. Eyes: no gross deformities noted. ENT: ears appear grossly normal no exudates. Neck: Supple.  No thyromegaly. No LAD. Respiratory: no rhonchi noted. Cardiovascular: Normal S1 and S2 without murmur or rub. Extremities: No cyanosis. pulses are equal. Neurologic: Alert and oriented. No involuntary movements.  LABS: No results found for this or any previous visit (from the past 2160 hour(s)).  Radiology: Abdomen 1 view (KUB)  Result Date: 11/09/2022 CLINICAL DATA:  History of renal stone. EXAM: ABDOMEN - 1 VIEW COMPARISON:  CT 03/05/2022. FINDINGS: Large amount of stool noted throughout the colon making evaluation for stone disease difficult. No bowel distention. Previously noted tiny bilateral renal stones are difficult to detect due to overlying stool. A tiny 2 mm right renal stone however does appear to be present. No ureteral stone identified. Degenerative changes scoliosis lumbar spine. No acute bony abnormality. IMPRESSION: Limited exam due to large amount of stool noted throughout the colon making evaluation for stone disease difficult. Previously noted tiny bilateral renal stones are difficult to detect  due to overlying stool. Tiny 2 mm right renal stone however does appear to be present. No ureteral stones identified on this limited exam. Electronically Signed   By: Maisie Fus  Register M.D.   On: 11/09/2022 09:55    No results found.  No results found.  Assessment and Plan: Patient Active Problem List   Diagnosis Date Noted   Coronary artery disease involving native coronary artery of native heart without angina pectoris    NSTEMI (non-ST elevated myocardial infarction) (HCC) 05/17/2021   Centrilobular emphysema (HCC) 04/07/2021   Cigarette nicotine dependence with nicotine-induced disorder 04/07/2021   Depression, major, single episode, mild (HCC) 04/07/2021   History of colonic polyps    Polyp of sigmoid colon    Rectal polyp    Elevated PSA 12/26/2019   Benign prostatic hyperplasia with urinary frequency 12/26/2019    1. SOB (shortness of breath) Patient has multiple  reasons for the shortness of breath the patient smokes and also has some reduction in lung function.  Patient needs to stop smoking - Spirometry with graph  2. Obstructive chronic bronchitis without exacerbation (HCC) Spoke to the patient about treatment of COPD will benefit from inhalers go ahead and give prescription for albuterol  3. Smokes one pack per day or less and motivated to quit Strongly recommended to get a CT low-dose lung screening study done - CT CHEST LUNG CA SCREEN LOW DOSE W/O CM; Future  4. Encounter for medication review - albuterol (VENTOLIN HFA) 108 (90 Base) MCG/ACT inhaler; Inhale 2 puffs into the lungs every 6 (six) hours as needed for wheezing or shortness of breath.  Dispense: 8 g; Refill: 2   General Counseling: I have discussed the findings of the evaluation and examination with [facility].  I have also discussed any further diagnostic evaluation thatmay be needed or ordered today. Bocephus verbalizes understanding of the findings of todays visit. We also reviewed his medications today and discussed drug interactions and side effects including but not limited excessive drowsiness and altered mental states. We also discussed that there is always a risk not just to him but also people around him. he has been encouraged to call the office with any questions or concerns that should arise related to todays visit.  Orders Placed This Encounter  Procedures   Spirometry with graph    Order Specific Question:   Where should this test be performed?    Answer:   [facility]     Time spent: 64  I have personally obtained a history, examined the patient, evaluated laboratory and imaging results, formulated the assessment and plan and placed orders.    Yevonne Pax, MD [facility] Pulmonary and Critical Care Sleep medicine

## 2023-07-05 NOTE — Patient Instructions (Signed)
Steps to Quit Smoking Smoking tobacco is the leading cause of preventable death. It can affect almost every organ in the body. Smoking puts you and people around you at risk for many serious, long-lasting (chronic) diseases. Quitting smoking can be hard, but it is one of the best things that you can do for your health. It is never too late to quit. Do not give up if you cannot quit the first time. Some people need to try many times to quit. Do your best to stick to your quit plan, and talk with your doctor if you have any questions or concerns. How do I get ready to quit? Pick a date to quit. Set a date within the next 2 weeks to give you time to prepare. Write down the reasons why you are quitting. Keep this list in places where you will see it often. Tell your family, friends, and co-workers that you are quitting. Their support is important. Talk with your doctor about the choices that may help you quit. Find out if your health insurance will pay for these treatments. Know the people, places, things, and activities that make you want to smoke (triggers). Avoid them. What first steps can I take to quit smoking? Throw away all cigarettes at home, at work, and in your car. Throw away the things that you use when you smoke, such as ashtrays and lighters. Clean your car. Empty the ashtray. Clean your home, including curtains and carpets. What can I do to help me quit smoking? Talk with your doctor about taking medicines and seeing a counselor. You are more likely to succeed when you do both. If you are pregnant or breastfeeding: Talk with your doctor about counseling or other ways to quit smoking. Do not take medicine to help you quit smoking unless your doctor tells you to. Quit right away Quit smoking completely, instead of slowly cutting back on how much you smoke over a period of time. Stopping smoking right away may be more successful than slowly quitting. Go to counseling. In-person is best  if this is an option. You are more likely to quit if you go to counseling sessions regularly. Take medicine You may take medicines to help you quit. Some medicines need a prescription, and some you can buy over-the-counter. Some medicines may contain a drug called nicotine to replace the nicotine in cigarettes. Medicines may: Help you stop having the desire to smoke (cravings). Help to stop the problems that come when you stop smoking (withdrawal symptoms). Your doctor may ask you to use: Nicotine patches, gum, or lozenges. Nicotine inhalers or sprays. Non-nicotine medicine that you take by mouth. Find resources Find resources and other ways to help you quit smoking and remain smoke-free after you quit. They include: Online chats with a Veterinary surgeon. Phone quitlines. Printed Materials engineer. Support groups or group counseling. Text messaging programs. Mobile phone apps. Use apps on your mobile phone or tablet that can help you stick to your quit plan. Examples of free services include Quit Guide from the CDC and smokefree.gov  What can I do to make it easier to quit?  Talk to your family and friends. Ask them to support and encourage you. Call a phone quitline, such as 1-800-QUIT-NOW, reach out to support groups, or work with a Veterinary surgeon. Ask people who smoke to not smoke around you. Avoid places that make you want to smoke, such as: Bars. Parties. Smoke-break areas at work. Spend time with people who do not smoke. Lower  the stress in your life. Stress can make you want to smoke. Try these things to lower stress: Getting regular exercise. Doing deep-breathing exercises. Doing yoga. Meditating. What benefits will I see if I quit smoking? Over time, you may have: A better sense of smell and taste. Less coughing and sore throat. A slower heart rate. Lower blood pressure. Clearer skin. Better breathing. Fewer sick days. Summary Quitting smoking can be hard, but it is one of  the best things that you can do for your health. Do not give up if you cannot quit the first time. Some people need to try many times to quit. When you decide to quit smoking, make a plan to help you succeed. Quit smoking right away, not slowly over a period of time. When you start quitting, get help and support to keep you smoke-free. This information is not intended to replace advice given to you by your health care provider. Make sure you discuss any questions you have with your health care provider. Document Revised: 08/28/2021 Document Reviewed: 08/28/2021 Elsevier Patient Education  2024 [employer].

## 2023-07-12 ENCOUNTER — Ambulatory Visit
Admission: RE | Admit: 2023-07-12 | Discharge: 2023-07-12 | Disposition: A | Discharge status: Home / Self Care | Payer: Medicare Other | Source: Ambulatory Visit | Attending: Internal Medicine | Admitting: Internal Medicine

## 2023-07-12 DIAGNOSIS — F17210 Nicotine dependence, cigarettes, uncomplicated: Secondary | ICD-10-CM

## 2023-07-21 ENCOUNTER — Telehealth: Payer: Self-pay | Admitting: Physician Assistant

## 2023-07-21 NOTE — Telephone Encounter (Signed)
Lvm to move 07/25/23 appointment. CT results not ready-Toni

## 2023-07-25 ENCOUNTER — Ambulatory Visit: Payer: Medicare Other | Admitting: Physician Assistant

## 2023-08-04 ENCOUNTER — Ambulatory Visit: Payer: Medicare Other | Admitting: Physician Assistant

## 2023-08-04 ENCOUNTER — Encounter: Payer: Self-pay | Admitting: Physician Assistant

## 2023-08-04 VITALS — BP 140/75 | HR 65 | Temp 98.4°F | Resp 16 | Ht 72.0 in | Wt 221.8 lb

## 2023-08-04 DIAGNOSIS — I700 Atherosclerosis of aorta: Secondary | ICD-10-CM

## 2023-08-04 DIAGNOSIS — I2510 Atherosclerotic heart disease of native coronary artery without angina pectoris: Secondary | ICD-10-CM | POA: Diagnosis not present

## 2023-08-04 DIAGNOSIS — F17219 Nicotine dependence, cigarettes, with unspecified nicotine-induced disorders: Secondary | ICD-10-CM

## 2023-08-04 DIAGNOSIS — J4489 Other specified chronic obstructive pulmonary disease: Secondary | ICD-10-CM | POA: Diagnosis not present

## 2023-08-04 NOTE — Progress Notes (Signed)
[facility] [address] [place], [place] [address]  Pulmonary Sleep Medicine   Office Visit Note  Patient Name: [name] [name] DOB: [birth year] MRN [id number]  Date of Service: 08/04/2023  Complaints/HPI: Pt is here for routine pulmonary follow up. He recently had a low dose CT chest lung cancer screening and is here to review this today. Has been smoking 1/2 ppd and is working toward cessation. His CT results are below. Will plan for repeat CT in 1 year. Did discuss coronary artery calcification findings and pt already sees cardiology and will follow up with them regarding this. He is on statin and ASA.  IMPRESSION: 1. Lung-RADS 2, benign appearance or behavior. Continue annual screening with low-dose chest CT without contrast in 12 months. 2. Coronary artery calcifications. 3. Aortic Atherosclerosis (ICD10-I70.0) and Emphysema (ICD10-J43.9)  ROS  General: (-) fever, (-) chills, (-) night sweats, (-) weakness Skin: (-) rashes, (-) itching,. Eyes: (-) visual changes, (-) redness, (-) itching. Nose and Sinuses: (-) nasal stuffiness or itchiness, (-) postnasal drip, (-) nosebleeds, (-) sinus trouble. Mouth and Throat: (-) sore throat, (-) hoarseness. Neck: (-) swollen glands, (-) enlarged thyroid, (-) neck pain. Respiratory: - cough, (-) bloody sputum, - shortness of breath, - wheezing. Cardiovascular: - ankle swelling, (-) chest pain. Lymphatic: (-) lymph node enlargement. Neurologic: (-) numbness, (-) tingling. Psychiatric: (-) anxiety, (-) depression   Current Medication: Outpatient Encounter Medications as of 08/04/2023  Medication Sig   albuterol (VENTOLIN HFA) 108 (90 Base) MCG/ACT inhaler Inhale 2 puffs into the lungs every 6 (six) hours as needed for wheezing or shortness of breath.   allopurinol (ZYLOPRIM) 100 MG tablet TAKE 2 TABLETS BY MOUTH DAILY   aspirin EC 81 MG EC tablet Take 1 tablet (81 mg total) by mouth daily. Swallow whole.   atorvastatin  (LIPITOR) 80 MG tablet TAKE 1 TABLET(80 MG) BY MOUTH DAILY   buPROPion (WELLBUTRIN SR) 150 MG 12 hr tablet TAKE 1 TABLET(150 MG) BY MOUTH TWICE DAILY   DULoxetine (CYMBALTA) 60 MG capsule Take 1 capsule (60 mg total) by mouth daily.   Fluticasone-Umeclidin-Vilant (TRELEGY ELLIPTA) 100-62.5-25 MCG/ACT AEPB TAKE 1 INHALATION INTO THE LUNGS BY MOUTH DAILY   HYDROcodone-acetaminophen (NORCO) 10-325 MG tablet Take 1 tablet by mouth 2 (two) times daily as needed for moderate pain or severe pain.   HYDROcodone-acetaminophen (NORCO) 10-325 MG tablet Take 1 tablet by mouth 2 (two) times daily as needed for moderate pain or severe pain.   HYDROcodone-acetaminophen (NORCO) 10-325 MG tablet Take 1 tablet by mouth 2 (two) times daily as needed for moderate pain or severe pain.   ibuprofen (ADVIL) 800 MG tablet Take 1 tablet (800 mg total) by mouth every 8 (eight) hours as needed for moderate pain.   sildenafil (VIAGRA) 100 MG tablet Take 1 tablet (100 mg total) by mouth daily as needed for erectile dysfunction.   tamsulosin (FLOMAX) 0.4 MG CAPS capsule Take 1 capsule (0.4 mg total) by mouth daily.   tiZANidine (ZANAFLEX) 4 MG tablet TAKE 1 TABLET BY MOUTH EVERY 8 HOURS AS NEEDED FOR MS   tolterodine (DETROL LA) 4 MG 24 hr capsule TAKE 1 CAPSULE(4 MG) BY MOUTH DAILY   varenicline (CHANTIX) 1 MG tablet Take 1 tablet (1 mg total) by mouth 2 (two) times daily.   No facility-administered encounter medications on file as of 08/04/2023.    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    Medical History: Past Medical History:  Diagnosis Date   Arthritis    COPD (chronic obstructive pulmonary disease) (HCC)    Depression    Emphysema of lung (HCC)    Gout    left knee   Heart attack (HCC)    Kidney stones    bilateral    Family History: Family History  Problem Relation Age of Onset   Alcohol abuse Mother    Alcohol abuse Father    Alcohol abuse Brother     Social History: Social History   Socioeconomic History   Marital status: Married    Spouse name: Not on file   Number of children: Not on file   Years of education: Not on file   Highest education level: Not on file  Occupational History   Not on file  Tobacco Use   Smoking status: Every Day    Current packs/day: 2.00    Average packs/day: 2.0 packs/day for 52.0 years (104.0 ttl pk-yrs)    Types: Cigarettes   Smokeless tobacco: Never   Tobacco comments:    1/2 pack daily  Vaping Use   Vaping status: Never Used  Substance and Sexual Activity   Alcohol use: Not Currently   Drug use: Never   Sexual activity: Not on file  Other Topics Concern   Not on file  Social History Narrative   Not on file   Social Determinants of Health   Financial Resource Strain: Low Risk  (08/10/2021)   Overall Financial Resource Strain (CARDIA)    Difficulty of Paying Living Expenses: Not hard at all  Food Insecurity: Not on file  Transportation Needs: Not on file  Physical Activity: Not on file  Stress: Not on file  Social Connections: Not on file  Intimate Partner Violence: Not on file    Vital Signs: Blood pressure (!) 140/75, pulse 65, temperature 98.4 F (36.9 C), resp. rate 16, height 6' (1.829 m), weight 221 lb 12.8 oz (100.6 kg), SpO2 97%.  Examination: General Appearance: The patient is well-developed, well-nourished, and in no distress. Skin: Gross inspection of skin unremarkable. Head: normocephalic, no gross  deformities. Eyes: no gross deformities noted. ENT: ears appear grossly normal no exudates. Neck: Supple. No thyromegaly. No LAD. Respiratory: Lungs clear to auscultation. Cardiovascular: Normal S1 and S2 without murmur or rub. Extremities: No cyanosis. pulses are equal. Neurologic: Alert and oriented. No involuntary movements.  LABS: No results found for this or any previous visit (from the past 2160 hour(s)).  Radiology: CT CHEST LUNG CA SCREEN LOW DOSE W/O CM  Result Date: 07/28/2023 CLINICAL DATA:  Lung cancer screening. One hundred eight pack-year history. Current asymptomatic smoker. EXAM: CT CHEST WITHOUT CONTRAST LOW-DOSE FOR LUNG CANCER SCREENING TECHNIQUE: Multidetector CT imaging of the chest was performed following the standard protocol without IV contrast. RADIATION DOSE REDUCTION: This exam was performed according to the departmental dose-optimization program which includes automated exposure control, adjustment of the mA and/or kV according to patient size and/or use of iterative reconstruction technique. COMPARISON:  03/04/2021. FINDINGS: Cardiovascular: Normal heart size. No pericardial effusion. Aortic atherosclerosis and coronary artery  calcifications. Mediastinum/Nodes: No enlarged mediastinal, hilar, or axillary lymph nodes. Thyroid gland, trachea, and esophagus demonstrate no significant findings. Lungs/Pleura: Centrilobular emphysema. 2 small lung nodules are identified. The largest is in the perihilar left upper lobe with a mean derived diameter of 5.5 mm. These are unchanged compared with the previous exam. No new or suspicious lung nodules Upper Abdomen: No acute abnormality. Musculoskeletal: No chest wall mass or suspicious bone lesions identified. IMPRESSION: 1. Lung-RADS 2, benign appearance or behavior. Continue annual screening with low-dose chest CT without contrast in 12 months. 2. Coronary artery calcifications. 3. Aortic Atherosclerosis (ICD10-I70.0) and Emphysema  (ICD10-J43.9). Electronically Signed   By: Signa Kell M.D.   On: 07/28/2023 16:36    No results found.  CT CHEST LUNG CA SCREEN LOW DOSE W/O CM  Result Date: 07/28/2023 CLINICAL DATA:  Lung cancer screening. One hundred eight pack-year history. Current asymptomatic smoker. EXAM: CT CHEST WITHOUT CONTRAST LOW-DOSE FOR LUNG CANCER SCREENING TECHNIQUE: Multidetector CT imaging of the chest was performed following the standard protocol without IV contrast. RADIATION DOSE REDUCTION: This exam was performed according to the departmental dose-optimization program which includes automated exposure control, adjustment of the mA and/or kV according to patient size and/or use of iterative reconstruction technique. COMPARISON:  03/04/2021. FINDINGS: Cardiovascular: Normal heart size. No pericardial effusion. Aortic atherosclerosis and coronary artery calcifications. Mediastinum/Nodes: No enlarged mediastinal, hilar, or axillary lymph nodes. Thyroid gland, trachea, and esophagus demonstrate no significant findings. Lungs/Pleura: Centrilobular emphysema. 2 small lung nodules are identified. The largest is in the perihilar left upper lobe with a mean derived diameter of 5.5 mm. These are unchanged compared with the previous exam. No new or suspicious lung nodules Upper Abdomen: No acute abnormality. Musculoskeletal: No chest wall mass or suspicious bone lesions identified. IMPRESSION: 1. Lung-RADS 2, benign appearance or behavior. Continue annual screening with low-dose chest CT without contrast in 12 months. 2. Coronary artery calcifications. 3. Aortic Atherosclerosis (ICD10-I70.0) and Emphysema (ICD10-J43.9). Electronically Signed   By: Signa Kell M.D.   On: 07/28/2023 16:36      Assessment and Plan: Patient Active Problem List   Diagnosis Date Noted   Coronary artery disease involving native coronary artery of native heart without angina pectoris    NSTEMI (non-ST elevated myocardial infarction) (HCC)  05/17/2021   Centrilobular emphysema (HCC) 04/07/2021   Cigarette nicotine dependence with nicotine-induced disorder 04/07/2021   Depression, major, single episode, mild (HCC) 04/07/2021   History of colonic polyps    Polyp of sigmoid colon    Rectal polyp    Elevated PSA 12/26/2019   Benign prostatic hyperplasia with urinary frequency 12/26/2019    1. Obstructive chronic bronchitis without exacerbation (HCC) Continue inhalers as prescribed  2. Aortic atherosclerosis (HCC) Continue statin  3. Coronary artery calcification seen on CT scan Will follow up with cardiology  4. Cigarette nicotine dependence with nicotine-induced disorder Continue to work on smoking cessation   General Counseling: I have discussed the findings of the evaluation and examination with [facility].  I have also discussed any further diagnostic evaluation thatmay be needed or ordered today. Shafter verbalizes understanding of the findings of todays visit. We also reviewed his medications today and discussed drug interactions and side effects including but not limited excessive drowsiness and altered mental states. We also discussed that there is always a risk not just to him but also people around him. he has been encouraged to call the office with any questions or concerns that should arise related to todays visit.  No orders of the defined types were placed in this encounter.    Time spent: 30  I have personally obtained a history, examined the patient, evaluated laboratory and imaging results, formulated the assessment and plan and placed orders. This patient was seen by Lynn Ito, PA-C in collaboration with Dr. Freda Munro as a part of collaborative care agreement.     Yevonne Pax, MD [facility] Pulmonary and Critical Care Sleep medicine

## 2023-08-08 ENCOUNTER — Ambulatory Visit: Payer: Medicare Other | Attending: Medical | Admitting: Medical

## 2023-08-08 ENCOUNTER — Encounter: Payer: Self-pay | Admitting: Medical

## 2023-08-08 VITALS — BP 108/60 | HR 64 | Ht 72.0 in | Wt 222.4 lb

## 2023-08-08 DIAGNOSIS — E785 Hyperlipidemia, unspecified: Secondary | ICD-10-CM

## 2023-08-08 DIAGNOSIS — I25118 Atherosclerotic heart disease of native coronary artery with other forms of angina pectoris: Secondary | ICD-10-CM | POA: Diagnosis not present

## 2023-08-08 DIAGNOSIS — Z720 Tobacco use: Secondary | ICD-10-CM

## 2023-08-08 MED ORDER — NITROGLYCERIN 0.4 MG SL SUBL: 0.4000 mg | SUBLINGUAL_TABLET | SUBLINGUAL | 3 refills | Status: AC | PRN

## 2023-08-08 NOTE — Patient Instructions (Signed)
Medication Instructions:  A prescription has been sent in for Nitroglycerin.  If you have chest pain that doesn't relieve quickly, place one tablet under your tongue and allow it to dissolve.  If no relief after 5 minutes, you may take another pill.  If no relief after 5 minutes, you may take a 3rd dose but you need to call 911 and report to ER immediately.  *If you need a refill on your cardiac medications before your next appointment, please call your pharmacy*   Lab Work: No labs ordered today    Testing/Procedures: No test ordered today    Follow-Up: At [facility], you and your health needs are our priority.  As part of our continuing mission to provide you with exceptional heart care, we have created designated Provider Care Teams.  These Care Teams include your primary Cardiologist (physician) and Advanced Practice Providers (APPs -  Physician Assistants and Nurse Practitioners) who all work together to provide you with the care you need, when you need it.  We recommend signing up for the patient portal called "MyChart".  Sign up information is provided on this After Visit Summary.  MyChart is used to connect with patients for Virtual Visits (Telemedicine).  Patients are able to view lab/test results, encounter notes, upcoming appointments, etc.  Non-urgent messages can be sent to your provider as well.   To learn more about what you can do with MyChart, go to [email or web address].    Your next appointment:   6 month(s)  Provider:   Terrilee Croak, PA-C

## 2023-08-08 NOTE — Progress Notes (Signed)
Cardiology Office Note:    Date:  08/08/2023   ID:  [name] [name], DOB June 19, 1956, MRN [id number]  PCP:  Sallyanne Kuster, NP  [facility] Cardiologist:  None  [facility] Electrophysiologist:  None   Referring MD: Sallyanne Kuster, NP   Chief Complaint: 6 month follow-up  History of Present Illness:    [name] [name] is a 66 y.o. male with a hx of COPD, tobacco use, HLD, depression and CAD who presents for 6 month follow-up.   He presented in August 2022 with NSTEMI. Cardiac cath showed significant 2V CAD with occluded RCA with well-developed L>R collaterals and 60% stenosis in the mid to distal left circumflex. EF was normal by echo. Elevated troponin was felt to be due to supply demand ischemia although plaque rupture in the mid to distal left circumflex could not be completely excluded. Plavix was added.   He was last seen 10/2022 and was overall stable from a cardiac perspective.   Today, he reports he is overall doing OK. He is still smoking 1/2 pack per day. He was going to the gym, but has not been in a week. He reports his diet is overall good. He has occasional slight strain in his chest. It lasts about 10-15 minutes. It's different than when he had his heart attack. He has SOB with exercise. No orthopnea, pnd, or lower leg edema.   Past Medical History:  Diagnosis Date   Arthritis    COPD (chronic obstructive pulmonary disease) (HCC)    Depression    Emphysema of lung (HCC)    Gout    left knee   Heart attack (HCC)    Kidney stones    bilateral    Past Surgical History:  Procedure Laterality Date   CARDIAC CATHETERIZATION     COLONOSCOPY WITH PROPOFOL N/A 04/07/2021   Procedure: COLONOSCOPY WITH PROPOFOL;  Surgeon: Pasty Spillers, MD;  Location: [facility] ENDOSCOPY;  Service: Endoscopy;  Laterality: N/A;   LEFT HEART CATH AND CORONARY ANGIOGRAPHY N/A 05/18/2021   Procedure: LEFT HEART CATH AND CORONARY ANGIOGRAPHY;  Surgeon: Iran Ouch, MD;   Location: [facility] INVASIVE CV LAB;  Service: Cardiovascular;  Laterality: N/A;   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   allopurinol (ZYLOPRIM) 100 MG tablet TAKE 2 TABLETS BY MOUTH DAILY   aspirin EC 81 MG EC tablet Take 1 tablet (81 mg total) by mouth daily. Swallow whole.   atorvastatin (LIPITOR) 80 MG tablet TAKE 1 TABLET(80 MG) BY MOUTH DAILY   buPROPion (WELLBUTRIN SR) 150 MG 12 hr tablet TAKE 1 TABLET(150 MG) BY MOUTH TWICE DAILY   DULoxetine (CYMBALTA) 60 MG capsule Take 1 capsule (60 mg total) by mouth daily.   Fluticasone-Umeclidin-Vilant (TRELEGY ELLIPTA) 100-62.5-25 MCG/ACT AEPB TAKE 1 INHALATION INTO THE LUNGS BY MOUTH DAILY   ibuprofen (ADVIL) 800 MG tablet Take 1 tablet (800 mg total) by mouth every 8 (eight) hours as needed for moderate pain.   nitroGLYCERIN (NITROSTAT) 0.4 MG SL tablet Place 1 tablet (0.4 mg total) under the tongue every 5 (five) minutes as needed for chest pain.   tamsulosin (FLOMAX) 0.4 MG CAPS capsule Take 1 capsule (0.4 mg total) by mouth daily.   tolterodine (DETROL LA) 4 MG 24 hr capsule TAKE 1 CAPSULE(4 MG) BY MOUTH DAILY  varenicline (CHANTIX) 1 MG tablet Take 1 tablet (1 mg total) by mouth 2 (two) times daily.     Allergies:   Patient has no known allergies.   Social History   Socioeconomic History   Marital status: Married    Spouse name: Not on file   Number of children: Not on file   Years of education: Not on file   Highest education level: Not on file  Occupational History   Not on file  Tobacco Use   Smoking status: Every Day    Current packs/day: 2.00    Average packs/day: 2.0 packs/day for 52.0 years (104.0 ttl pk-yrs)    Types: Cigarettes   Smokeless tobacco: Never   Tobacco comments:    1/2 pack daily  Vaping Use    Vaping status: Never Used  Substance and Sexual Activity   Alcohol use: Not Currently   Drug use: Never   Sexual activity: Not on file  Other Topics Concern   Not on file  Social History Narrative   Not on file   Social Determinants of Health   Financial Resource Strain: Low Risk  (08/10/2021)   Overall Financial Resource Strain (CARDIA)    Difficulty of Paying Living Expenses: Not hard at all  Food Insecurity: Not on file  Transportation Needs: Not on file  Physical Activity: Not on file  Stress: Not on file  Social Connections: Not on file     Family History: The patient's family history includes Alcohol abuse in his brother, father, and mother.  ROS:   Please see the history of present illness.     All other systems reviewed and are negative.  EKGs/Labs/Other Studies Reviewed:    The following studies were reviewed today:  LHC 2022    Prox RCA to Mid RCA lesion is 100% stenosed.   Mid LAD lesion is 30% stenosed.   Mid Cx to Dist Cx lesion is 60% stenosed.   1.  Significant two-vessel coronary artery disease.  Chronically occluded proximal to mid right coronary artery with well-developed left-to-right collaterals.  There is also 60% stenosis in the mid to distal left circumflex. 2.  Left ventricular angiography was not performed.  EF was normal by echo. 3.  Normal left ventricular end-diastolic pressure.   Recommendations: No clear culprit is identified for non-STEMI.  The RCA occlusion appears to be chronic with well-developed collaterals.  Cannot completely exclude possible plaque rupture in the mid to distal left circumflex but the lesion does not appear to be obstructive at this point. Recommend medical therapy.  I added clopidogrel for 1 year.   Coronary Diagrams  Diagnostic Dominance: Right     Echo 04/2021   1. Left ventricular ejection fraction, by estimation, is 55 to 60%. The  left ventricle has normal function. The left ventricle has no regional   wall motion abnormalities. Left ventricular diastolic parameters are  consistent with Grade I diastolic  dysfunction (impaired relaxation).   2. Right ventricular systolic function is normal. The right ventricular  size is normal. Tricuspid regurgitation signal is inadequate for assessing  PA pressure.   3. Left atrial size was mildly dilated.   EKG:  EKG is ordered today.  The ekg ordered today demonstrates NSR 64bpm, LAD, no changes  Recent Labs: 09/09/2022: ALT 22; Hemoglobin 14.1; Platelets 194  Recent Lipid Panel    Component Value Date/Time   CHOL 99 (L) 09/09/2022 1009   TRIG 96 09/09/2022 1009   HDL 36 (L) 09/09/2022 1009  CHOLHDL 2.8 09/09/2022 1009   CHOLHDL 4.4 05/18/2021 0424   VLDL 17 05/18/2021 0424   LDLCALC 44 09/09/2022 1009   LDLDIRECT 54 07/17/2021 1410    Physical Exam:    VS:  BP 108/60 (BP Location: Left Arm, Patient Position: Sitting, Cuff Size: Normal)   Pulse 64   Ht 6' (1.829 m)   Wt 222 lb 6 oz (100.9 kg)   SpO2 98%   BMI 30.16 kg/m     Wt Readings from Last 3 Encounters:  08/08/23 222 lb 6 oz (100.9 kg)  08/04/23 221 lb 12.8 oz (100.6 kg)  07/05/23 219 lb 3.2 oz (99.4 kg)     GEN:  Well nourished, well developed in no acute distress HEENT: Normal NECK: No JVD; No carotid bruits LYMPHATICS: No lymphadenopathy CARDIAC: RRR, no murmurs, rubs, gallops RESPIRATORY:  Clear to auscultation without rales, wheezing or rhonchi  ABDOMEN: Soft, non-tender, non-distended MUSCULOSKELETAL:  No edema; No deformity  SKIN: Warm and dry NEUROLOGIC:  Alert and oriented x 3 PSYCHIATRIC:  Normal affect   ASSESSMENT:    1. Coronary artery disease involving native coronary artery of native heart with other form of angina pectoris (HCC)   2. Hyperlipidemia LDL goal <70   3. Tobacco use    PLAN:    In order of problems listed above:  Chest discomfort CAD  He has CTO of the RCA with well developed collaterals and 60% stenosis of the mid to distal  left Cx by cath in 2022. He reports occasional chest strain accross his chest. It is not similar to the pain prior to his heart attack and it is not worse with exertion. He reports he is active and eats healthy.Continue Aspirin 81mg  daily and Lipitor 80mg  daily.  I will send in SL NTG.   HLD LDL 44 in 2023. PCP will check labs. Continue Lipitor 80mg  daily.   Tobacco use He is smoking 1/2 ppd, cessation advised.   Disposition: Follow up in 6 month(s) with MD/APP    Signed, Avice Funchess David Stall, PA-C  08/08/2023 1:40 PM    [facility]

## 2023-08-10 ENCOUNTER — Other Ambulatory Visit: Payer: Self-pay | Admitting: Nurse Practitioner

## 2023-08-10 DIAGNOSIS — Z79899 Other long term (current) drug therapy: Secondary | ICD-10-CM

## 2023-09-07 ENCOUNTER — Encounter: Payer: Self-pay | Admitting: Nurse Practitioner

## 2023-09-07 ENCOUNTER — Ambulatory Visit (INDEPENDENT_AMBULATORY_CARE_PROVIDER_SITE_OTHER): Payer: Medicare Other | Admitting: Nurse Practitioner

## 2023-09-07 VITALS — BP 114/72 | HR 74 | Temp 98.4°F | Resp 16 | Ht 72.0 in | Wt 224.6 lb

## 2023-09-07 DIAGNOSIS — R519 Headache, unspecified: Secondary | ICD-10-CM | POA: Diagnosis not present

## 2023-09-07 DIAGNOSIS — G44201 Tension-type headache, unspecified, intractable: Secondary | ICD-10-CM

## 2023-09-07 MED ORDER — BUTALBITAL-APAP-CAFFEINE 50-325-40 MG PO TABS: 1.0000 | ORAL_TABLET | Freq: Two times a day (BID) | ORAL | 1 refills | Status: DC | PRN

## 2023-09-07 NOTE — Progress Notes (Signed)
[facility] [address] [place], [place] [address]  Internal MEDICINE  Office Visit Note  Patient Name: [name] [name]  [id number]  [id number]  Date of Service: 09/07/2023  Chief Complaint  Patient presents with   Acute Visit    Headache for few days      HPI [name] [name] presents for an acute sick visit for new onset headache.  --onset was about 1 week ago, was not very bad but has gradually become worse. Reports having one every night and the severity has been getting worse. Described the pain as a throbbing/pounding band across the forehead.  --having increased fatigue --denies nausea, vomiting, sensitivity to light or sound. No change in vision, no eye pain.  --has been taking aleve OTC which initially helped relieve the pain and it has gradually been less effective now.       Current Medication:  Outpatient Encounter Medications as of 09/07/2023  Medication Sig   albuterol (VENTOLIN HFA) 108 (90 Base) MCG/ACT inhaler Inhale 2 puffs into the lungs every 6 (six) hours as needed for wheezing or shortness of breath.   allopurinol (ZYLOPRIM) 100 MG tablet TAKE 2 TABLETS BY MOUTH DAILY   aspirin EC 81 MG EC tablet Take 1 tablet (81 mg total) by mouth daily. Swallow whole.   atorvastatin (LIPITOR) 80 MG tablet TAKE 1 TABLET(80 MG) BY MOUTH DAILY   buPROPion (WELLBUTRIN SR) 150 MG 12 hr tablet TAKE 1 TABLET(150 MG) BY MOUTH TWICE DAILY   butalbital-acetaminophen-caffeine (FIORICET) 50-325-40 MG tablet Take 1-2 tablets by mouth 2 (two) times daily as needed for headache.   DULoxetine (CYMBALTA) 60 MG capsule Take 1 capsule (60 mg total) by mouth daily.   Fluticasone-Umeclidin-Vilant (TRELEGY ELLIPTA) 100-62.5-25 MCG/ACT AEPB TAKE 1 INHALATION INTO THE LUNGS BY MOUTH DAILY   ibuprofen (ADVIL) 800 MG tablet Take 1 tablet (800 mg total) by mouth every 8 (eight) hours as needed for moderate pain.   nitroGLYCERIN (NITROSTAT) 0.4 MG SL tablet Place 1 tablet (0.4 mg total)  under the tongue every 5 (five) minutes as needed for chest pain.   tamsulosin (FLOMAX) 0.4 MG CAPS capsule Take 1 capsule (0.4 mg total) by mouth daily.   tolterodine (DETROL LA) 4 MG 24 hr capsule TAKE 1 CAPSULE BY MOUTH DAILY   varenicline (CHANTIX) 1 MG tablet Take 1 tablet (1 mg total) by mouth 2 (two) times daily.   No facility-administered encounter medications on file as of 09/07/2023.      Medical History: Past Medical History:  Diagnosis Date   Arthritis    COPD (chronic obstructive pulmonary disease) (HCC)    Depression    Emphysema of lung (HCC)    Gout    left knee   Heart attack (HCC)    Kidney stones    bilateral     Vital Signs: BP 114/72   Pulse 74   Temp 98.4 F (36.9 C)   Resp 16   Ht 6' (1.829 m)   Wt 224 lb 9.6 oz (101.9 kg)   SpO2 96%   BMI 30.46 kg/m    Review of Systems  Constitutional:  Positive for fatigue.  Respiratory:  Negative for cough, chest tightness, shortness of breath and wheezing.   Cardiovascular: Negative.  Negative for chest pain and palpitations.  Gastrointestinal: Negative.  Negative for nausea.  Musculoskeletal: Negative.   Neurological:  Positive for headaches.  Psychiatric/Behavioral:  Negative for self-injury, sleep disturbance and suicidal ideas.     Physical Exam Vitals reviewed.  Constitutional:  General: He is not in acute distress.    Appearance: Normal appearance. He is obese. He is not ill-appearing.  HENT:     Head: Normocephalic and atraumatic.  Eyes:     Pupils: Pupils are equal, round, and reactive to light.  Cardiovascular:     Rate and Rhythm: Normal rate and regular rhythm.  Pulmonary:     Effort: Pulmonary effort is normal. No respiratory distress.  Neurological:     Mental Status: He is alert and oriented to person, place, and time.  Psychiatric:        Mood and Affect: Mood normal.        Behavior: Behavior normal.       Assessment/Plan: 1. Acute intractable tension-type headache  (Primary) CT head ordered to rule out acute abnormality related to new onset headache. Fioricet prescribed for acute headache.  - CT HEAD WO CONTRAST ( ); Future - butalbital-acetaminophen-caffeine (FIORICET) 50-325-40 MG tablet; Take 1-2 tablets by mouth 2 (two) times daily as needed for headache.  Dispense: 30 tablet; Refill: 1  2. New onset of headaches after age 67 CT head ordered.  - CT HEAD WO CONTRAST ( ); Future    General Counseling: [name] [name] verbalizes understanding of the findings of todays visit and agrees with plan of treatment. I have discussed any further diagnostic evaluation that may be needed or ordered today. We also reviewed his medications today. he has been encouraged to call the office with any questions or concerns that should arise related to todays visit.    Counseling:    Orders Placed This Encounter  Procedures   CT HEAD WO CONTRAST ( )    Meds ordered this encounter  Medications   butalbital-acetaminophen-caffeine (FIORICET) 50-325-40 MG tablet    Sig: Take 1-2 tablets by mouth 2 (two) times daily as needed for headache.    Dispense:  30 tablet    Refill:  1    Please fill new script today    Return if symptoms worsen or fail to improve.  [place] Controlled Substance Database was reviewed by me for overdose risk score (ORS)  Time spent:20 Minutes Time spent with patient included reviewing progress notes, labs, imaging studies, and discussing plan for follow up.   This patient was seen by Sallyanne Kuster, FNP-C in collaboration with Dr. Beverely Risen as a part of collaborative care agreement.  Prentiss Polio R. Tedd Sias, MSN, FNP-C Internal Medicine

## 2023-09-08 ENCOUNTER — Ambulatory Visit
Admission: RE | Admit: 2023-09-08 | Discharge: 2023-09-08 | Disposition: A | Discharge status: Home / Self Care | Payer: Medicare Other | Source: Ambulatory Visit | Attending: Nurse Practitioner | Admitting: Nurse Practitioner

## 2023-09-08 DIAGNOSIS — R519 Headache, unspecified: Secondary | ICD-10-CM

## 2023-09-08 DIAGNOSIS — G44201 Tension-type headache, unspecified, intractable: Secondary | ICD-10-CM

## 2023-09-15 ENCOUNTER — Encounter: Payer: Self-pay | Admitting: Nurse Practitioner

## 2023-09-15 ENCOUNTER — Ambulatory Visit (INDEPENDENT_AMBULATORY_CARE_PROVIDER_SITE_OTHER): Payer: Medicare Other | Admitting: Nurse Practitioner

## 2023-09-15 VITALS — BP 135/80 | HR 60 | Temp 98.7°F | Resp 16 | Ht 72.0 in | Wt 224.6 lb

## 2023-09-15 DIAGNOSIS — R1031 Right lower quadrant pain: Secondary | ICD-10-CM | POA: Diagnosis not present

## 2023-09-15 DIAGNOSIS — I2510 Atherosclerotic heart disease of native coronary artery without angina pectoris: Secondary | ICD-10-CM | POA: Diagnosis not present

## 2023-09-15 DIAGNOSIS — J432 Centrilobular emphysema: Secondary | ICD-10-CM | POA: Diagnosis not present

## 2023-09-15 DIAGNOSIS — G4719 Other hypersomnia: Secondary | ICD-10-CM | POA: Diagnosis not present

## 2023-09-15 DIAGNOSIS — Z0000 Encounter for general adult medical examination without abnormal findings: Secondary | ICD-10-CM

## 2023-09-15 DIAGNOSIS — F17219 Nicotine dependence, cigarettes, with unspecified nicotine-induced disorders: Secondary | ICD-10-CM | POA: Diagnosis not present

## 2023-09-15 DIAGNOSIS — I700 Atherosclerosis of aorta: Secondary | ICD-10-CM | POA: Diagnosis not present

## 2023-09-15 DIAGNOSIS — G4709 Other insomnia: Secondary | ICD-10-CM | POA: Diagnosis not present

## 2023-09-15 MED ORDER — HYDROCODONE-ACETAMINOPHEN 5-325 MG PO TABS: 1.0000 | ORAL_TABLET | Freq: Four times a day (QID) | ORAL | 0 refills | Status: DC | PRN

## 2023-09-15 NOTE — Progress Notes (Cosign Needed)
[facility] [address] [place], [place] [address]  Internal MEDICINE  Office Visit Note  Patient Name: [name] [name]  [id number]  [id number]  Date of Service: 09/15/2023  Chief Complaint  Patient presents with   Medicare Wellness   Depression   Abdominal Pain    Right side flank pain, ongoing for a while    HPI [name] [name] presents for an annual well visit and physical exam.  Well-appearing 67 y.o. male with CAD, emphysema, BPH, depression, and history of MI last year.  Routine CRC screening: due in 2027 Labs: due for routine labs  Smokes 10-12 cigarettes per day or less.  Pain on the right side/flank, muscle relaxers are not helping.  Head CT was normal, no cause of headaches identified.  Fioricet is not helping much, headaches are still present, waking up 3-4 times per night, not sleeping well.  Has been having trouble sleeping for almost 1 year. Taking naps during the day.  Reports waking up every hour and a half, dry mouth, morning headaches. Rarely snores, has not had any witnessed apneas, not waking up gasping.   EPWORTH SLEEPINESS SCALE: Scale: (0)= no chance of dozing; (1)= slight chance of dozing; (2)= moderate chance of dozing; (3)= high chance of dozing Chance  Situtation Sitting and reading: 3 Watching TV: 1 Sitting Inactive in public: 2 As a passenger in car: 0   Lying down to rest: 3 Sitting and talking: 0 Sitting quielty after lunch: 3 In a car, stopped in traffic: 0 TOTAL SCORE:   12 out of 24  STOP-BANG score of 4      09/15/2023   10:00 AM 09/09/2022    8:54 AM 09/04/2021   10:57 AM  MMSE - Mini Mental State Exam  Orientation to time 5 5 5   Orientation to Place 5 5 5   Registration 3 3 3   Attention/ Calculation 5 5 5   Recall 3 3 3   Language- name 2 objects 2 2 2   Language- repeat 1 1 1   Language- follow 3 step command 3 3 3   Language- read & follow direction 1 1 1   Write a sentence 1 1 1   Copy design 1 1 1   Total score 30 30  30     Functional Status Survey: Is the patient deaf or have difficulty hearing?: No Does the patient have difficulty seeing, even when wearing glasses/contacts?: No Does the patient have difficulty concentrating, remembering, or making decisions?: No Does the patient have difficulty walking or climbing stairs?: No Does the patient have difficulty dressing or bathing?: No Does the patient have difficulty doing errands alone such as visiting a doctor's office or shopping?: No     03/31/2022    1:28 PM 06/21/2022    3:37 PM 09/09/2022    8:53 AM 12/09/2022    1:24 PM 09/15/2023    9:59 AM  Fall Risk  Falls in the past year? 0 1 0 0 0  Was there an injury with Fall?  0 0 0   Fall Risk Category Calculator  1 0 0   Fall Risk Category (Retired)  Low Low    (RETIRED) Patient Fall Risk Level Low fall risk Low fall risk Low fall risk    Patient at Risk for Falls Due to No Fall Risks Impaired balance/gait No Fall Risks No Fall Risks   Fall risk Follow up Falls evaluation completed Falls evaluation completed Falls evaluation completed Falls evaluation completed        09/15/2023  11:27 AM  Depression screen PHQ 2/9  Decreased Interest 0  Down, Depressed, Hopeless 0  PHQ - 2 Score 0     Current Medication: Outpatient Encounter Medications as of 09/15/2023  Medication Sig   albuterol (VENTOLIN HFA) 108 (90 Base) MCG/ACT inhaler Inhale 2 puffs into the lungs every 6 (six) hours as needed for wheezing or shortness of breath.   allopurinol (ZYLOPRIM) 100 MG tablet TAKE 2 TABLETS BY MOUTH DAILY   aspirin EC 81 MG EC tablet Take 1 tablet (81 mg total) by mouth daily. Swallow whole.   atorvastatin (LIPITOR) 80 MG tablet TAKE 1 TABLET(80 MG) BY MOUTH DAILY   buPROPion (WELLBUTRIN SR) 150 MG 12 hr tablet TAKE 1 TABLET(150 MG) BY MOUTH TWICE DAILY   butalbital-acetaminophen-caffeine (FIORICET) 50-325-40 MG tablet Take 1-2 tablets by mouth 2 (two) times daily as needed for headache.   DULoxetine  (CYMBALTA) 60 MG capsule Take 1 capsule (60 mg total) by mouth daily.   Fluticasone-Umeclidin-Vilant (TRELEGY ELLIPTA) 100-62.5-25 MCG/ACT AEPB TAKE 1 INHALATION INTO THE LUNGS BY MOUTH DAILY   HYDROcodone-acetaminophen (NORCO/VICODIN) 5-325 MG tablet Take 1 tablet by mouth every 6 (six) hours as needed for moderate pain (pain score 4-6).   ibuprofen (ADVIL) 800 MG tablet Take 1 tablet (800 mg total) by mouth every 8 (eight) hours as needed for moderate pain.   nitroGLYCERIN (NITROSTAT) 0.4 MG SL tablet Place 1 tablet (0.4 mg total) under the tongue every 5 (five) minutes as needed for chest pain.   tamsulosin (FLOMAX) 0.4 MG CAPS capsule Take 1 capsule (0.4 mg total) by mouth daily.   tolterodine (DETROL LA) 4 MG 24 hr capsule TAKE 1 CAPSULE BY MOUTH DAILY   varenicline (CHANTIX) 1 MG tablet Take 1 tablet (1 mg total) by mouth 2 (two) times daily.   No facility-administered encounter medications on file as of 09/15/2023.    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    Medical History: Past Medical History:  Diagnosis Date   Arthritis    COPD (chronic obstructive pulmonary disease) (HCC)    Depression    Emphysema of lung (HCC)    Gout    left knee   Heart attack (HCC)    Kidney stones    bilateral    Family History: Family History  Problem Relation Age of Onset   Alcohol abuse Mother    Alcohol abuse Father    Alcohol abuse Brother     Social History   Socioeconomic History    Marital status: Married    Spouse name: Not on file   Number of children: Not on file   Years of education: Not on file   Highest education level: Not on file  Occupational History   Not on file  Tobacco Use   Smoking status: Every Day    Current packs/day: 2.00    Average packs/day: 2.0 packs/day for 52.0 years (104.0 ttl pk-yrs)    Types: Cigarettes   Smokeless tobacco: Never   Tobacco comments:    1/2  pack daily  Vaping Use   Vaping status: Never Used  Substance and Sexual Activity   Alcohol use: Not Currently   Drug use: Never   Sexual activity: Not on file  Other Topics Concern   Not on file  Social History Narrative   Not on file   Social Drivers of Health   Financial Resource Strain: Low Risk  (08/10/2021)   Overall Financial Resource Strain (CARDIA)    Difficulty of Paying Living Expenses: Not hard at all  Food Insecurity: Not on file  Transportation Needs: Not on file  Physical Activity: Not on file  Stress: Not on file  Social Connections: Not on file  Intimate Partner Violence: Not on file      Review of Systems  Constitutional:  Negative for activity change, appetite change, chills, fatigue, fever and unexpected weight change.  HENT: Negative.  Negative for congestion, ear pain, rhinorrhea, sore throat and trouble swallowing.   Eyes: Negative.   Respiratory: Negative.  Negative for cough, chest tightness, shortness of breath and wheezing.   Cardiovascular: Negative.  Negative for chest pain.  Gastrointestinal: Negative.  Negative for abdominal pain, blood in stool, constipation, diarrhea, nausea and vomiting.  Endocrine: Negative.   Genitourinary: Negative.  Negative for difficulty urinating, dysuria, frequency, hematuria and urgency.  Musculoskeletal:  Positive for arthralgias. Negative for back pain, joint swelling, myalgias and neck pain.       Left knee pain  Skin: Negative.  Negative for rash and wound.  Allergic/Immunologic: Negative.   Negative for immunocompromised state.  Neurological: Negative.  Negative for dizziness, seizures, numbness and headaches.  Hematological: Negative.   Psychiatric/Behavioral:  Negative for behavioral problems, self-injury and suicidal ideas. The patient is not nervous/anxious.     Vital Signs: BP 135/80   Pulse 60   Temp 98.7 F (37.1 C)   Resp 16   Ht 6' (1.829 m)   Wt 224 lb 9.6 oz (101.9 kg)   SpO2 98%   BMI 30.46 kg/m    Physical Exam Vitals reviewed.  Constitutional:      General: He is awake. He is not in acute distress.    Appearance: Normal appearance. He is well-developed and well-groomed. He is obese. He is not ill-appearing or diaphoretic.  HENT:     Head: Normocephalic and atraumatic.     Mouth/Throat:     Lips: Pink.     Pharynx: Uvula midline.  Eyes:     General: Lids are normal. Vision grossly intact. Gaze aligned appropriately.     Extraocular Movements: Extraocular movements intact.     Conjunctiva/sclera: Conjunctivae normal.     Pupils: Pupils are equal, round, and reactive to light.     Funduscopic exam:    Right eye: Red reflex present.        Left eye: Red reflex present. Neck:     Thyroid: No thyromegaly.     Vascular: No JVD.     Trachea: Trachea and phonation normal. No tracheal deviation.  Cardiovascular:     Rate and Rhythm: Normal rate and regular rhythm.     Pulses: Normal pulses.     Heart sounds: Normal heart sounds, S1 normal and S2 normal. No murmur heard.    No friction rub. No gallop.  Pulmonary:     Effort: Pulmonary effort is normal. No accessory muscle usage or respiratory distress.     Breath sounds: Normal breath sounds and air entry. No stridor. No wheezing or rales.  Chest:  Chest wall: No tenderness.  Abdominal:     General: Bowel sounds are normal. There is no distension.     Palpations: Abdomen is soft. There is no shifting dullness, fluid wave or pulsatile mass.     Tenderness: There is no abdominal tenderness.  There is no guarding.  Musculoskeletal:     Cervical back: Neck supple.  Skin:    General: Skin is warm and dry.     Capillary Refill: Capillary refill takes less than 2 seconds.     Coloration: Skin is not pale.     Findings: No erythema or rash.  Neurological:     Mental Status: He is alert and oriented to person, place, and time.     Cranial Nerves: No cranial nerve deficit.     Motor: No abnormal muscle tone.     Coordination: Coordination normal.     Gait: Gait normal.     Deep Tendon Reflexes: Reflexes are normal and symmetric.  Psychiatric:        Mood and Affect: Mood normal.        Behavior: Behavior normal. Behavior is cooperative.        Assessment/Plan: 1. Encounter for subsequent annual wellness visit (AWV) in Medicare patient (Primary) Age-appropriate preventive screenings and vaccinations discussed, annual physical exam completed. Routine labs for health maintenance will be ordered and requisition form was given to patient. PHM updated.    2. Centrilobular emphysema (HCC) Continue trelegy as prescribed.   3. Aortic atherosclerosis (HCC) Continue atorvastatin as prescribed.   4. Coronary artery calcification seen on CT scan Continue atorvastatin as prescribed. Consider repeating echocardiogram and carotid ultrasound next year  5. Excessive daytime sleepiness Home sleep test ordered  - Home sleep test  6. Other insomnia Home sleep test ordered - Home sleep test  7. Right lower quadrant abdominal pain Small amount of hydrocodone prescribed for right side pain.  - HYDROcodone-acetaminophen (NORCO/VICODIN) 5-325 MG tablet; Take 1 tablet by mouth every 6 (six) hours as needed for moderate pain (pain score 4-6).  Dispense: 20 tablet; Refill: 0  8. Cigarette nicotine dependence with nicotine-induced disorder Still smoking, not ready to quit.       General Counseling: Tavis verbalizes understanding of the findings of todays visit and agrees with plan of  treatment. I have discussed any further diagnostic evaluation that may be needed or ordered today. We also reviewed his medications today. he has been encouraged to call the office with any questions or concerns that should arise related to todays visit.    Orders Placed This Encounter  Procedures   Home sleep test    Meds ordered this encounter  Medications   HYDROcodone-acetaminophen (NORCO/VICODIN) 5-325 MG tablet    Sig: Take 1 tablet by mouth every 6 (six) hours as needed for moderate pain (pain score 4-6).    Dispense:  20 tablet    Refill:  0    Fill new script today    Return in about 1 month (around 10/16/2023) for F/U, Labs and home sleep test results , Maliek Schellhorn PCP.   Total time spent:30 Minutes Time spent includes review of chart, medications, test results, and follow up plan with the patient.   [place] Controlled Substance Database was reviewed by me.  This patient was seen by Sallyanne Kuster, FNP-C in collaboration with Dr. Beverely Risen as a part of collaborative care agreement.  Shaelee Forni R. Tedd Sias, MSN, FNP-C Internal medicine

## 2023-09-16 ENCOUNTER — Other Ambulatory Visit: Payer: Self-pay | Admitting: Nurse Practitioner

## 2023-09-16 DIAGNOSIS — E538 Deficiency of other specified B group vitamins: Secondary | ICD-10-CM | POA: Diagnosis not present

## 2023-09-16 DIAGNOSIS — E782 Mixed hyperlipidemia: Secondary | ICD-10-CM | POA: Diagnosis not present

## 2023-09-16 DIAGNOSIS — E559 Vitamin D deficiency, unspecified: Secondary | ICD-10-CM | POA: Diagnosis not present

## 2023-09-16 DIAGNOSIS — I700 Atherosclerosis of aorta: Secondary | ICD-10-CM | POA: Diagnosis not present

## 2023-09-17 LAB — LIPID PANEL
Chol/HDL Ratio: 3.5 {ratio} (ref 0.0–5.0)
Cholesterol, Total: 111 mg/dL (ref 100–199)
HDL: 32 mg/dL — ABNORMAL LOW (ref >39)
LDL Chol Calc (NIH): 55 mg/dL (ref 0–99)
Triglycerides: 132 mg/dL (ref 0–149)
VLDL Cholesterol Cal: 24 mg/dL (ref 5–40)

## 2023-09-17 LAB — CBC WITH DIFFERENTIAL/PLATELET
Basophils Absolute: 0 10*3/uL (ref 0.0–0.2)
Basos: 0 %
EOS (ABSOLUTE): 0.2 10*3/uL (ref 0.0–0.4)
Eos: 4 %
Hematocrit: 41.3 % (ref 37.5–51.0)
Hemoglobin: 13.8 g/dL (ref 13.0–17.7)
Immature Grans (Abs): 0 10*3/uL (ref 0.0–0.1)
Immature Granulocytes: 0 %
Lymphocytes Absolute: 1.6 10*3/uL (ref 0.7–3.1)
Lymphs: 24 %
MCH: 29.1 pg (ref 26.6–33.0)
MCHC: 33.4 g/dL (ref 31.5–35.7)
MCV: 87 fL (ref 79–97)
Monocytes Absolute: 0.7 10*3/uL (ref 0.1–0.9)
Monocytes: 10 %
Neutrophils Absolute: 4.2 10*3/uL (ref 1.4–7.0)
Neutrophils: 62 %
Platelets: 195 10*3/uL (ref 150–450)
RBC: 4.74 x10E6/uL (ref 4.14–5.80)
RDW: 13.1 % (ref 11.6–15.4)
WBC: 6.8 10*3/uL (ref 3.4–10.8)

## 2023-09-17 LAB — COMPREHENSIVE METABOLIC PANEL
ALT: 20 [IU]/L (ref 0–44)
AST: 22 [IU]/L (ref 0–40)
Albumin: 4.4 g/dL (ref 3.9–4.9)
Alkaline Phosphatase: 110 [IU]/L (ref 44–121)
BUN/Creatinine Ratio: 12 (ref 10–24)
BUN: 14 mg/dL (ref 8–27)
Bilirubin Total: 0.4 mg/dL (ref 0.0–1.2)
CO2: 24 mmol/L (ref 20–29)
Calcium: 9.3 mg/dL (ref 8.6–10.2)
Chloride: 104 mmol/L (ref 96–106)
Creatinine, Ser: 1.13 mg/dL (ref 0.76–1.27)
Globulin, Total: 2.3 g/dL (ref 1.5–4.5)
Glucose: 110 mg/dL — ABNORMAL HIGH (ref 70–99)
Potassium: 4.7 mmol/L (ref 3.5–5.2)
Sodium: 142 mmol/L (ref 134–144)
Total Protein: 6.7 g/dL (ref 6.0–8.5)
eGFR: 70 mL/min/{1.73_m2} (ref >59)

## 2023-09-17 LAB — B12 AND FOLATE PANEL
Folate: 7.1 ng/mL (ref >3.0)
Vitamin B-12: >2000 pg/mL — ABNORMAL HIGH (ref 232–1245)

## 2023-09-17 LAB — VITAMIN D 25 HYDROXY (VIT D DEFICIENCY, FRACTURES): Vit D, 25-Hydroxy: 48.2 ng/mL (ref 30.0–100.0)

## 2023-09-17 LAB — PSA: Prostate Specific Ag, Serum: 13 ng/mL — ABNORMAL HIGH (ref 0.0–4.0)

## 2023-09-27 ENCOUNTER — Encounter: Payer: Self-pay | Admitting: Physical Therapy

## 2023-09-27 ENCOUNTER — Ambulatory Visit: Payer: Medicare Other | Attending: Nurse Practitioner | Admitting: Physical Therapy

## 2023-09-27 ENCOUNTER — Other Ambulatory Visit: Payer: Self-pay | Admitting: Nurse Practitioner

## 2023-09-27 DIAGNOSIS — R293 Abnormal posture: Secondary | ICD-10-CM | POA: Insufficient documentation

## 2023-09-27 DIAGNOSIS — R2689 Other abnormalities of gait and mobility: Secondary | ICD-10-CM | POA: Diagnosis not present

## 2023-09-27 DIAGNOSIS — N50811 Right testicular pain: Secondary | ICD-10-CM | POA: Insufficient documentation

## 2023-09-27 DIAGNOSIS — G8929 Other chronic pain: Secondary | ICD-10-CM | POA: Insufficient documentation

## 2023-09-27 DIAGNOSIS — M545 Low back pain, unspecified: Secondary | ICD-10-CM | POA: Diagnosis not present

## 2023-09-27 DIAGNOSIS — F17210 Nicotine dependence, cigarettes, uncomplicated: Secondary | ICD-10-CM

## 2023-09-27 DIAGNOSIS — M5459 Other low back pain: Secondary | ICD-10-CM | POA: Diagnosis not present
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
## 2023-09-27 NOTE — Patient Instructions (Signed)
 Stand perpendicular to the wall   Rainbow stretches --> high five   10 reps each side every 30 min   __  Avoid straining pelvic floor, abdominal muscles , spine     Proper body mechanics with getting out of a chair to decrease strain  on back &pelvic floor   Avoid holding your breath when Getting out of the chair:  Scoot to front part of chair chair Heels behind knees, feet are hip width apart, nose over toes  Inhale like you are smelling roses Exhale to stand   __  Sitting with feet on ground, four points of contact Catch yourself crossing ankles and thighs  __

## 2023-09-27 NOTE — Telephone Encounter (Signed)
 Please review

## 2023-10-05 ENCOUNTER — Ambulatory Visit: Payer: Medicare Other | Admitting: Physical Therapy

## 2023-10-12 ENCOUNTER — Telehealth: Payer: Self-pay | Admitting: Nurse Practitioner

## 2023-10-12 ENCOUNTER — Ambulatory Visit: Payer: Medicare Other | Admitting: Physical Therapy

## 2023-10-12 DIAGNOSIS — M5459 Other low back pain: Secondary | ICD-10-CM

## 2023-10-12 DIAGNOSIS — R293 Abnormal posture: Secondary | ICD-10-CM

## 2023-10-12 DIAGNOSIS — M545 Low back pain, unspecified: Secondary | ICD-10-CM | POA: Diagnosis not present

## 2023-10-12 DIAGNOSIS — R2689 Other abnormalities of gait and mobility: Secondary | ICD-10-CM | POA: Diagnosis not present

## 2023-10-12 DIAGNOSIS — G8929 Other chronic pain: Secondary | ICD-10-CM | POA: Diagnosis not present

## 2023-10-12 NOTE — Telephone Encounter (Signed)
Home SS order emailed to [facility] w/ [employer]. Requested to be expedited-Toni
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
## 2023-10-12 NOTE — Patient Instructions (Addendum)
  SKI against chair   In front of chair, knee against seat to line knee above ankle,  back foot hip width apart, push off through ballmounds, Push off through ballmounds , step back to under hip width  2 min legs only    opp arm up, thumbs up, chin tuck, shoulders down 2 mins   __ Doorway stepping back    __  Drink more water: Start with 3 glasses 12 oz on the counter Make sure to have emptied them by end of the day   Decrease tea :   3-4 glasses 8 oz   --> 3 glasses   __  Follow up with doctor for sleep study    [facility]   [address] [place], [place], [place] [address] [phone]  [phone]

## 2023-10-14 ENCOUNTER — Telehealth: Payer: Self-pay | Admitting: Nurse Practitioner

## 2023-10-14 NOTE — Telephone Encounter (Signed)
SS appointment 10/24/23 @ [name] [name]

## 2023-10-17 ENCOUNTER — Encounter: Payer: Self-pay | Admitting: Nurse Practitioner

## 2023-10-17 ENCOUNTER — Ambulatory Visit (INDEPENDENT_AMBULATORY_CARE_PROVIDER_SITE_OTHER): Payer: Medicare Other | Admitting: Nurse Practitioner

## 2023-10-17 VITALS — BP 110/76 | HR 90 | Temp 97.9°F | Resp 16 | Ht 72.0 in | Wt 221.8 lb

## 2023-10-17 DIAGNOSIS — E786 Lipoprotein deficiency: Secondary | ICD-10-CM | POA: Diagnosis not present

## 2023-10-17 DIAGNOSIS — R7303 Prediabetes: Secondary | ICD-10-CM | POA: Insufficient documentation

## 2023-10-17 DIAGNOSIS — C61 Malignant neoplasm of prostate: Secondary | ICD-10-CM | POA: Insufficient documentation

## 2023-10-17 DIAGNOSIS — R1031 Right lower quadrant pain: Secondary | ICD-10-CM | POA: Diagnosis not present

## 2023-10-17 LAB — POCT GLYCOSYLATED HEMOGLOBIN (HGB A1C): Hemoglobin A1C: 6 % — AB (ref 4.0–5.6)

## 2023-10-17 MED ORDER — HYDROCODONE-ACETAMINOPHEN 5-325 MG PO TABS: 1.0000 | ORAL_TABLET | Freq: Four times a day (QID) | ORAL | 0 refills | Status: DC | PRN

## 2023-10-17 NOTE — Progress Notes (Signed)
[facility] [address] [place], [place] [address]  Internal MEDICINE  Office Visit Note  Patient Name: [name] [name]  [id number]  [id number]  Date of Service: 10/17/2023  Chief Complaint  Patient presents with   Depression   Follow-up    Review labs.     Depression        Associated symptoms include myalgias.  Associated symptoms include no fatigue and no suicidal ideas.  [name] [name] presents for a follow-up visit for Prediabetes -- elevated glucose on fasting labs, need repeat A1c, last done in 2023. Cholesterol levels are good except low HDL, takes fish oil supplement and statin PSA level has increased, patient has been seeing oncology monitoring his prostate cancer.  Pain medication refill -- [facility].   Current Medication: Outpatient Encounter Medications as of 10/17/2023  Medication Sig   albuterol (VENTOLIN HFA) 108 (90 Base) MCG/ACT inhaler Inhale 2 puffs into the lungs every 6 (six) hours as needed for wheezing or shortness of breath.   allopurinol (ZYLOPRIM) 100 MG tablet TAKE 2 TABLETS BY MOUTH DAILY   aspirin EC 81 MG EC tablet Take 1 tablet (81 mg total) by mouth daily. Swallow whole.   atorvastatin (LIPITOR) 80 MG tablet TAKE 1 TABLET(80 MG) BY MOUTH DAILY   buPROPion (WELLBUTRIN SR) 150 MG 12 hr tablet TAKE 1 TABLET(150 MG) BY MOUTH TWICE DAILY   butalbital-acetaminophen-caffeine (FIORICET) 50-325-40 MG tablet Take 1-2 tablets by mouth 2 (two) times daily as needed for headache.   DULoxetine (CYMBALTA) 60 MG capsule Take 1 capsule (60 mg total) by mouth daily.   Fluticasone-Umeclidin-Vilant (TRELEGY ELLIPTA) 100-62.5-25 MCG/ACT AEPB TAKE 1 INHALATION INTO THE LUNGS BY MOUTH DAILY   ibuprofen (ADVIL) 800 MG tablet Take 1 tablet (800 mg total) by mouth every 8 (eight) hours as needed for moderate pain.   nitroGLYCERIN (NITROSTAT) 0.4 MG SL tablet Place 1 tablet (0.4 mg total) under the tongue every 5 (five) minutes as  needed for chest pain.   tamsulosin (FLOMAX) 0.4 MG CAPS capsule Take 1 capsule (0.4 mg total) by mouth daily.   tolterodine (DETROL LA) 4 MG 24 hr capsule TAKE 1 CAPSULE BY MOUTH DAILY   varenicline (CHANTIX) 1 MG tablet TAKE 1 TABLET(1 MG) BY MOUTH TWICE DAILY   [DISCONTINUED] HYDROcodone-acetaminophen (NORCO/VICODIN) 5-325 MG tablet Take 1 tablet by mouth every 6 (six) hours as needed for moderate pain (pain score 4-6).   HYDROcodone-acetaminophen (NORCO/VICODIN) 5-325 MG tablet Take 1 tablet by mouth every 6 (six) hours as needed for moderate pain (pain score 4-6).   No facility-administered encounter medications on file as of 10/17/2023.    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    Medical History: Past Medical History:  Diagnosis Date   Arthritis    COPD (chronic obstructive pulmonary disease) (HCC)    Depression    Emphysema of lung (HCC)    Gout    left knee   Heart attack (HCC)    Kidney stones  bilateral    Family History: Family History  Problem Relation Age of Onset   Alcohol abuse Mother    Alcohol abuse Father    Alcohol abuse Brother     Social History   Socioeconomic History   Marital status: Married    Spouse name: Not on file   Number of children: Not on file   Years of education: Not on file   Highest education level: Not on file  Occupational History   Not on file  Tobacco Use   Smoking status: Every Day    Current packs/day: 2.00    Average  packs/day: 2.0 packs/day for 52.0 years (104.0 ttl pk-yrs)    Types: Cigarettes   Smokeless tobacco: Never   Tobacco comments:    1/2 pack daily  Vaping Use   Vaping status: Never Used  Substance and Sexual Activity   Alcohol use: Not Currently   Drug use: Never   Sexual activity: Not on file  Other Topics Concern   Not on file  Social History Narrative   Not on file   Social Drivers of Health   Financial Resource Strain: Low Risk  (08/10/2021)   Overall Financial Resource Strain (CARDIA)    Difficulty of Paying Living Expenses: Not hard at all  Food Insecurity: Not on file  Transportation Needs: Not on file  Physical Activity: Not on file  Stress: Not on file  Social Connections: Not on file  Intimate Partner Violence: Not on file      Review of Systems  Constitutional:  Negative for chills, fatigue and unexpected weight change.  HENT:  Positive for congestion, ear pain, hearing loss and postnasal drip. Negative for rhinorrhea, sneezing and sore throat.   Respiratory: Negative.  Negative for cough, chest tightness, shortness of breath and wheezing.   Cardiovascular: Negative.  Negative for chest pain and palpitations.  Gastrointestinal: Negative.  Negative for abdominal pain, constipation, diarrhea, nausea and vomiting.  Genitourinary:  Negative for dysuria and frequency.  Musculoskeletal:  Positive for arthralgias and myalgias. Negative for back pain, joint swelling and neck pain.  Skin:  Negative for rash.  Neurological: Negative.  Negative for tremors and numbness.  Hematological:  Negative for adenopathy. Does not bruise/bleed easily.  Psychiatric/Behavioral:  Positive for behavioral problems (Depression) and depression. Negative for self-injury, sleep disturbance and suicidal ideas. The patient is nervous/anxious.     Vital Signs: BP 110/76   Pulse 90   Temp 97.9 F (36.6 C)   Resp 16   Ht 6' (1.829 m)   Wt 221 lb 12.8 oz (100.6 kg)   SpO2 97%   BMI 30.08  kg/m    Physical Exam Vitals reviewed.  Constitutional:      General: He is not in acute distress.    Appearance: Normal appearance. He is not ill-appearing.  HENT:     Head: Normocephalic and atraumatic.     Right Ear: Tympanic membrane, ear canal and external ear normal.     Left Ear: Tympanic membrane, ear canal and external ear normal.  Eyes:     Pupils: Pupils are equal, round, and reactive to light.  Cardiovascular:     Rate and Rhythm: Normal rate and regular rhythm.  Pulmonary:     Effort: Pulmonary effort is normal. No respiratory distress.  Neurological:     Mental Status: He is alert and oriented to person, place, and time.  Psychiatric:        Mood and Affect: Mood normal.  Behavior: Behavior normal.        Assessment/Plan: 1. Prediabetes (Primary) Repeat A1c stable 6.0. continue low carb, low sugar diet  - POCT glycosylated hemoglobin (Hb A1C)  2. Prostate cancer (HCC) PSA level increased to 13.1. informed patient to let his oncologist know who is monitoring his prostate cancer. He is not currently receiving treatment at this time.   3. Right lower quadrant abdominal pain Pain medication refill sent per patient request. - HYDROcodone-acetaminophen (NORCO/VICODIN) 5-325 MG tablet; Take 1 tablet by mouth every 6 (six) hours as needed for moderate pain (pain score 4-6).  Dispense: 20 tablet; Refill: 0  4. Low HDL (under 40) Continue prescribed cholesterol medication and fish oil supplement.    General Counseling: Yussuf verbalizes understanding of the findings of todays visit and agrees with plan of treatment. I have discussed any further diagnostic evaluation that may be needed or ordered today. We also reviewed his medications today. he has been encouraged to call the office with any questions or concerns that should arise related to todays visit.    Orders Placed This Encounter  Procedures   POCT glycosylated hemoglobin (Hb A1C)    Meds ordered  this encounter  Medications   HYDROcodone-acetaminophen (NORCO/VICODIN) 5-325 MG tablet    Sig: Take 1 tablet by mouth every 6 (six) hours as needed for moderate pain (pain score 4-6).    Dispense:  20 tablet    Refill:  0    Fill new script today    Return in about 5 months (around 03/16/2024) for F/U, Lennan Malone PCP.   Total time spent:30 Minutes Time spent includes review of chart, medications, test results, and follow up plan with the patient.   [place] Controlled Substance Database was reviewed by me.  This patient was seen by Sallyanne Kuster, FNP-C in collaboration with Dr. Beverely Risen as a part of collaborative care agreement.   Adlai Sinning R. Tedd Sias, MSN, FNP-C Internal medicine

## 2023-10-19 ENCOUNTER — Ambulatory Visit: Payer: Medicare Other | Admitting: Physical Therapy

## 2023-10-19 ENCOUNTER — Telehealth: Payer: Self-pay

## 2023-10-19 DIAGNOSIS — R2689 Other abnormalities of gait and mobility: Secondary | ICD-10-CM

## 2023-10-19 DIAGNOSIS — M545 Low back pain, unspecified: Secondary | ICD-10-CM | POA: Diagnosis not present

## 2023-10-19 DIAGNOSIS — M5459 Other low back pain: Secondary | ICD-10-CM

## 2023-10-19 DIAGNOSIS — R293 Abnormal posture: Secondary | ICD-10-CM

## 2023-10-19 DIAGNOSIS — G8929 Other chronic pain: Secondary | ICD-10-CM | POA: Diagnosis not present

## 2023-10-19 NOTE — Telephone Encounter (Signed)
Mason, [phone], PT, called too request Alyssa D/C pelvic PT, instead place an order for falls prevention and gait PT. Order can be faxed to: [phone]. New PT will be for 2x a week for 8 weeks.
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
## 2023-10-20 ENCOUNTER — Encounter (INDEPENDENT_AMBULATORY_CARE_PROVIDER_SITE_OTHER): Payer: Medicare Other | Admitting: Internal Medicine

## 2023-10-20 DIAGNOSIS — G4733 Obstructive sleep apnea (adult) (pediatric): Secondary | ICD-10-CM

## 2023-10-20 DIAGNOSIS — G4719 Other hypersomnia: Secondary | ICD-10-CM

## 2023-10-24 ENCOUNTER — Telehealth: Payer: Self-pay | Admitting: Nurse Practitioner

## 2023-10-24 NOTE — Telephone Encounter (Signed)
Received rehab service referral from [facility] Gave to [employer]

## 2023-10-26 ENCOUNTER — Ambulatory Visit: Payer: Medicare Other | Attending: Nurse Practitioner | Admitting: Physical Therapy

## 2023-10-26 DIAGNOSIS — R2689 Other abnormalities of gait and mobility: Secondary | ICD-10-CM | POA: Insufficient documentation

## 2023-10-26 DIAGNOSIS — M5459 Other low back pain: Secondary | ICD-10-CM | POA: Insufficient documentation

## 2023-10-26 DIAGNOSIS — R293 Abnormal posture: Secondary | ICD-10-CM | POA: Diagnosis not present

## 2023-10-26 NOTE — Patient Instructions (Signed)
 Minisquat: Scoot buttocks back slight, hinge like you are looking at your reflection on a pond  Knees behind toes,  Inhale to smell flowers  Exhale on the rise like rocket  Do not lock knees, have more weight across ballmounds of feet, toes relaxed and spread them, not grip them   10 reps x 3 x day   __ Squat   Feet shoulder width apart, a few inches in front of wall, lean against wall by squeezing shoulder blades together and back of head pressed against the wall. Inhale as you lower by bending hips and knees just enough that you can still see your toes.  Exhale, engage deep core muscles as you rise up to stand. Repeat _10___ times. Do _3___ sessions per day.  Copyright  VHI. All rights reserved.     __   Seated blue band,  Elbow by ribs-dont move them Inhale,  Exhale pull apart Thumbs out   ___  Deep core level 1-2 (2 x ) slowly, in and out thru nose
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
## 2023-11-01 NOTE — Procedures (Signed)
Study Date: 10/20/2023  Patient Name: [name] [name], [name] [name]. Recording Device: Tawana Scale  Sex: Male Height: 72.0 in.  D.O.B.: 10-Dec-1955 Weight: 224.0 lbs.  Age: 68 years B.M.I: 30.4 lb/in2   Times and Durations  Lights off clock time:  11:21:14 PM Total Recording Time (TRT): 125.4 minutes  Lights on clock time: 1:26:38 AM Time In Bed (TIB): 125.4 minutes    Monitoring Time (MT): 124.9 minutes   Device and Sensor Details  The study was recorded on a [employer] device using 1 RIP effort belt and a pressure-based flow sensor. The heart rate is derived from the oximeter sensor and the snore signal is derived from the pressure sensor. The device also records body position and uses it to determine the monitoring time (sleep/wake periods).   Summary  REI 3.4 OAI 0.5 CAI 0.0 Lowest Desat 88  REI is the number of respiratory events per hour. OAI is the number of obstructive apneas per hour. CAI is the number of central apneas per hour. Lowest Desat is the lowest blood oxygen level that lasted at least 2 seconds.   RESPIRATORY EVENTS   Index (#/hour) Total # of Events Mean duration  (sec) Max duration  (sec) # of Events by Position       Supine Prone Left Right Up  Central Apneas 0.0 0 0.0 0.0 0    0 0 0  Obstructive Apneas 0.5 1 14.0 14.0 0    0 1 0  Mixed Apneas 0.0 0 0.0 0.0 0    0 0 0  Hypopneas 2.9 6 15.8 22.5 0    0 6 0  Apneas + Hypopneas 3.4 7 15.6 22.5 0    0 7 0  RERAs 0.0 0 0.0 0.0 0    0 0 0  Total 3.4 7 15.6 22.5 0    0 7 0  Time in Position 18.1    40.4 66.3 0.1  REI in Position 0.0    0.0 6.3 0.0    Positional Summary   Supine Non-Supine  Total # of Events 0 7.00  Total Duration (minutes) 18.1 106.80  Sleep Duration (minutes) 18.1 106.80  REI in Position 0.0 3.93  REISupine / REINon-Supine Ratio 0.00   Positional Sleep Apnea is indicated if REI (supine) >= 2 x REI (non-supine) per Blain Pais, Sleep. 1984;7(2).     Oximetry Summary    Dur. (min) % TIB  <90 % 0.0 0.0  <85 % 0.0 0.0  <80 % 0.0 0.0  <70 % 0.0 0.0  Total Dur (min) < 89 0.0 min  Average (%) 93  Total # of Desats 29  Desat Index (#/hour) 14.0  Desat Max (%) 7  Desat Max dur (sec) 66.0  Lowest SpO2 % during sleep 88  Duration of Min SpO2 (sec) 2  Highest SpO2 % during sleep 99  Duration of Max SpO2(sec) 4    Heart Rate Stats  Mean HR during sleep 62.2 (BPM)  Highest HR during sleep 79  (BPM)  Highest HR during TIB  79 (BPM)  Lowest HR during sleep 57  (BPM)  Lowest HR during TIB 57 (BPM)    Snoring Summary  Total Snoring Episodes 0  Total Duration with Snoring    minutes  Mean Duration of Snoring    seconds  Percentage of Snoring    %             [facility]  Portable Polysomnogram Report Part 1  Phone: [phone] Fax: [phone]  Patient Name: [name] [name]. Recording Device: Tawana Scale  D.O.B.: July 06, 1956 Acquisition Number: [id number]  Referring Physician: Candis Shine. Tedd Sias, FNP-C Acquisition Date: 10/20/2023   History: The patient is a 68 year old male who was referred for evaluation of possible sleep apnea with snoring and witnessed apnea.  Medical History: arthritis, COPD, depression, empysima, gout, heart attack. Medications: albuterol, allopurinol, aspirin 81, atorvastatin, bupropion, Fioricet, duloxetine, Trelegy, Norco, Nitrostat, tamsulosin, tolterodine, Chantix PROCEDURE  The unattended portable polysomnogram was conducted on the night of 10/20/2023.  The following parameters were monitored: Nasal and oral airflow, and body position. Additionally, thoracic and abdominal movements were recorded by inductance plethysmography. Oxygen saturation (SpO2) and heart rate (ECG) was monitored using a pulse oximeter.  The tracing was scored using 30 second epochs. Hypopneas were scored per AASM definition VIII4.B (4% desaturation).   Description: The total recording time was 125.4 minutes. Sleep  parameters are not recorded.  Respiratory monitoring demonstrated  snoring across the night in all positions. There were a total of 7 apneas and hypopneas for a Respiratory Event Index of 3.4 apneas and hypopneas per hour of recording. The average duration of the respiratory events was 15.6 seconds with a maximum duration of 22.5 seconds. The respiratory events were associated with peripheral oxygen desaturations on the average to 93%. The lowest oxygen desaturation associated with a respiratory event was 88%. The total duration of oxygen < 90% was 0 minutes.    Cardiac monitoring- The average heart rate during the recording 62.2 bpm.  Impression: This routine overnight portable polysomnogram did not demonstrate obstructive sleep apnea. Overall, the Respiratory Event Index was 3.4 apneas and hypopneas per hour of recording with the lowest desaturation to 88 %. Recording time was limited at just over 2 hours.  Recommendations:     A repeat study is recommended. Weight loss is recommended with a BMI of 30.4.   Yevonne Pax, MD [facility] Diplomate ABMS Pulmonary Critical Care and Sleep Medicine Electronically reviewed and digitally signed

## 2023-11-01 NOTE — Telephone Encounter (Signed)
Done

## 2023-11-02 ENCOUNTER — Ambulatory Visit: Payer: Medicare Other | Admitting: Physical Therapy

## 2023-11-02 ENCOUNTER — Telehealth: Payer: Self-pay | Admitting: Nurse Practitioner

## 2023-11-02 NOTE — Telephone Encounter (Signed)
rehab service referral signed. Faxed back to [facility]; [phone]. Scanned-Toni

## 2023-11-09 ENCOUNTER — Encounter: Payer: Medicare Other | Admitting: Physical Therapy

## 2023-11-14 ENCOUNTER — Encounter: Payer: Self-pay | Admitting: Emergency Medicine

## 2023-11-14 ENCOUNTER — Other Ambulatory Visit: Payer: Self-pay

## 2023-11-14 ENCOUNTER — Ambulatory Visit
Admission: EM | Admit: 2023-11-14 | Discharge: 2023-11-14 | Disposition: A | Discharge status: Home / Self Care | Payer: Medicare Other | Attending: Emergency Medicine | Admitting: Emergency Medicine

## 2023-11-14 ENCOUNTER — Telehealth: Payer: Self-pay

## 2023-11-14 DIAGNOSIS — J069 Acute upper respiratory infection, unspecified: Secondary | ICD-10-CM | POA: Diagnosis not present

## 2023-11-14 MED ORDER — AMOXICILLIN-POT CLAVULANATE 875-125 MG PO TABS: 1.0000 | ORAL_TABLET | Freq: Two times a day (BID) | ORAL | 0 refills | Status: DC

## 2023-11-14 NOTE — ED Triage Notes (Signed)
 Patient presents to [facility] for evaluation of one week of cough, congestion, malaise, fatigue.

## 2023-11-14 NOTE — ED Provider Notes (Signed)
 [name] [name]    CSN: [id number] Arrival date & time: 11/14/23  1421      History   Chief Complaint Chief Complaint  Patient presents with   Cough    HPI [name] [name] is a 68 y.o. male.   Patient presents for evaluation of fatigue congestion, rhinorrhea, productive cough with yellow sputum and sore throat present for 7 days.  Shortness of breath and wheezing only occurring with coughing.  No known sick contacts.  Has attempted use of TheraFlu and NyQuil.  History of COPD.  Denies fever .  Past Medical History:  Diagnosis Date   Arthritis    COPD (chronic obstructive pulmonary disease) (HCC)    Depression    Emphysema of lung (HCC)    Gout    left knee   Heart attack (HCC)    Kidney stones    bilateral    Patient Active Problem List   Diagnosis Date Noted   Prostate cancer (HCC) 10/17/2023   Prediabetes 10/17/2023   Excessive daytime sleepiness 09/15/2023   Aortic atherosclerosis (HCC) 09/15/2023   Coronary artery disease involving native coronary artery of native heart without angina pectoris    NSTEMI (non-ST elevated myocardial infarction) (HCC) 05/17/2021   Centrilobular emphysema (HCC) 04/07/2021   Cigarette nicotine dependence with nicotine-induced disorder 04/07/2021   Depression, major, single episode, mild (HCC) 04/07/2021   History of colonic polyps    Polyp of sigmoid colon    Rectal polyp    Elevated PSA 12/26/2019   Benign prostatic hyperplasia with urinary frequency 12/26/2019    Past Surgical History:  Procedure Laterality Date   CARDIAC CATHETERIZATION     COLONOSCOPY WITH PROPOFOL N/A 04/07/2021   Procedure: COLONOSCOPY WITH PROPOFOL;  Surgeon: Pasty Spillers, MD;  Location: [facility] ENDOSCOPY;  Service: Endoscopy;  Laterality: N/A;   LEFT HEART CATH AND CORONARY ANGIOGRAPHY N/A 05/18/2021   Procedure: LEFT HEART CATH AND CORONARY ANGIOGRAPHY;  Surgeon: Iran Ouch, MD;  Location: [facility] INVASIVE CV LAB;  Service:  Cardiovascular;  Laterality: N/A;   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 Authorizing Provider  amoxicillin-clavulanate (AUGMENTIN) 875-125 MG tablet Take 1 tablet by mouth every 12 (twelve) hours. 11/14/23  Yes Suzzane Quilter R, NP  albuterol (VENTOLIN HFA) 108 (90 Base) MCG/ACT inhaler Inhale 2 puffs into the lungs every 6 (six) hours as needed for wheezing or shortness of breath. 07/05/23   Yevonne Pax, MD  allopurinol (ZYLOPRIM) 100 MG tablet TAKE 2 TABLETS BY MOUTH DAILY 02/28/23   Sallyanne Kuster, NP  aspirin EC 81 MG EC tablet Take 1 tablet (81 mg total) by mouth daily. Swallow whole. 05/19/21   Enedina Finner, MD  atorvastatin (LIPITOR) 80 MG tablet TAKE 1 TABLET(80 MG) BY MOUTH DAILY 03/07/23   Iran Ouch, MD  buPROPion (WELLBUTRIN SR) 150 MG 12 hr tablet TAKE 1 TABLET(150 MG) BY MOUTH TWICE DAILY 05/31/23   Sallyanne Kuster, NP  butalbital-acetaminophen-caffeine (FIORICET) 50-325-40 MG tablet Take 1-2 tablets by mouth 2 (two) times daily as needed for headache. 09/07/23   Sallyanne Kuster, NP  DULoxetine (CYMBALTA) 60 MG capsule Take 1 capsule (60 mg total) by mouth daily. 12/09/22   Sallyanne Kuster, NP  Fluticasone-Umeclidin-Vilant (TRELEGY ELLIPTA) 100-62.5-25 MCG/ACT AEPB TAKE 1 INHALATION INTO THE LUNGS BY MOUTH DAILY 05/31/23   Sallyanne Kuster, NP  HYDROcodone-acetaminophen (NORCO/VICODIN) 5-325 MG tablet Take 1  tablet by mouth every 6 (six) hours as needed for moderate pain (pain score 4-6). 10/17/23   Sallyanne Kuster, NP  ibuprofen (ADVIL) 800 MG tablet Take 1 tablet (800 mg total) by mouth every 8 (eight) hours as needed for moderate pain. 03/04/23   Sallyanne Kuster, NP  nitroGLYCERIN (NITROSTAT) 0.4 MG SL tablet Place 1 tablet (0.4 mg total) under the tongue every 5 (five) minutes as needed for chest pain. 08/08/23 11/06/23  Furth, Cadence H, PA-C  tamsulosin  (FLOMAX) 0.4 MG CAPS capsule Take 1 capsule (0.4 mg total) by mouth daily. 05/31/23   Sallyanne Kuster, NP  tolterodine (DETROL LA) 4 MG 24 hr capsule TAKE 1 CAPSULE BY MOUTH DAILY 08/10/23   Sallyanne Kuster, NP  varenicline (CHANTIX) 1 MG tablet TAKE 1 TABLET(1 MG) BY MOUTH TWICE DAILY 09/28/23   Sallyanne Kuster, NP    Family History Family History  Problem Relation Age of Onset   Alcohol abuse Mother    Alcohol abuse Father    Alcohol abuse Brother     Social History Social History   Tobacco Use   Smoking status: Every Day    Current packs/day: 2.00    Average packs/day: 2.0 packs/day for 52.0 years (104.0 ttl pk-yrs)    Types: Cigarettes   Smokeless tobacco: Never   Tobacco comments:    1/2 pack daily  Vaping Use   Vaping status: Never Used  Substance Use Topics   Alcohol use: Not Currently   Drug use: Never     Allergies   Patient has no known allergies.   Review of Systems Review of Systems   Physical Exam Triage Vital Signs ED Triage Vitals  Encounter Vitals Group     BP 11/14/23 1527 110/63     Systolic BP Percentile --      Diastolic BP Percentile --      Pulse Rate 11/14/23 1527 70     Resp 11/14/23 1527 18     Temp 11/14/23 1527 98.6 F (37 C)     Temp Source 11/14/23 1527 Oral     SpO2 11/14/23 1527 95 %     Weight --      Height --      Head Circumference --      Peak Flow --      Pain Score 11/14/23 1528 0     Pain Loc --      Pain Education --      Exclude from Growth Chart --    No data found.  Updated Vital Signs BP 110/63 (BP Location: Left Arm)   Pulse 70   Temp 98.6 F (37 C) (Oral)   Resp 18   SpO2 95%   Visual Acuity Right Eye Distance:   Left Eye Distance:   Bilateral Distance:    Right Eye Near:   Left Eye Near:    Bilateral Near:     Physical Exam Constitutional:      Appearance: Normal appearance.  HENT:     Head: Normocephalic.     Right Ear: Tympanic membrane, ear canal and external ear normal.      Left Ear: Tympanic membrane, ear canal and external ear normal.     Nose: Congestion present. No rhinorrhea.     Mouth/Throat:     Pharynx: Posterior oropharyngeal erythema present. No oropharyngeal exudate.  Cardiovascular:     Rate and Rhythm: Normal rate and regular rhythm.     Pulses: Normal pulses.     Heart sounds:  Normal heart sounds.  Pulmonary:     Effort: Pulmonary effort is normal.     Breath sounds: Normal breath sounds.  Neurological:     Mental Status: He is alert and oriented to person, place, and time. Mental status is at baseline.      UC Treatments / Results  Labs (all labs ordered are listed, but only abnormal results are displayed) Labs Reviewed - No data to display  EKG   Radiology No results found.  Procedures Procedures (including critical care time)  Medications Ordered in UC Medications - No data to display  Initial Impression / Assessment and Plan / UC Course  I have reviewed the triage vital signs and the nursing notes.  Pertinent labs & imaging results that were available during my care of the patient were reviewed by me and considered in my medical decision making (see chart for details).  Acute URI  Patient is in no signs of distress nor toxic appearing.  Vital signs are stable.  Low suspicion for pneumonia, pneumothorax or bronchitis and therefore will defer imaging.  Prescribed Augmentin.  Declined prescription for cough medicine.May use additional over-the-counter medications as needed for supportive care.  May follow-up with urgent care as needed if symptoms persist or worsen.  Final Clinical Impressions(s) / UC Diagnoses   Final diagnoses:  Acute URI     Discharge Instructions      Begin  Augmentin every morning and every evening for 7 days to clear any bacteria contributing to your symptoms    You can take Tylenol and/or Ibuprofen as needed for fever reduction and pain relief.   For cough: honey 1/2 to 1 teaspoon (you can  dilute the honey in water or another fluid).  You can also use guaifenesin and dextromethorphan for cough. You can use a humidifier for chest congestion and cough.  If you don't have a humidifier, you can sit in the bathroom with the hot shower running.      For sore throat: try warm salt water gargles, cepacol lozenges, throat spray, warm tea or water with lemon/honey, popsicles or ice, or OTC cold relief medicine for throat discomfort.   For congestion: take a daily anti-histamine like Zyrtec, Claritin, and a oral decongestant, such as pseudoephedrine.  You can also use Flonase 1-2 sprays in each nostril daily.   It is important to stay hydrated: drink plenty of fluids (water, gatorade/powerade/pedialyte, juices, or teas) to keep your throat moisturized and help further relieve irritation/discomfort.    ED Prescriptions     Medication Sig Dispense Auth. Provider   amoxicillin-clavulanate (AUGMENTIN) 875-125 MG tablet Take 1 tablet by mouth every 12 (twelve) hours. 14 tablet Isaiha Asare, Elita Boone, NP      PDMP not reviewed this encounter.   Valinda Hoar, [place] 11/14/23 [phone]

## 2023-11-14 NOTE — Telephone Encounter (Signed)
 Pt called that having sinus infection and coughing advised him to go to ED

## 2023-11-14 NOTE — Discharge Instructions (Signed)
 Begin  Augmentin every morning and every evening for 7 days to clear any bacteria contributing to your symptoms    You can take Tylenol and/or Ibuprofen as needed for fever reduction and pain relief.   For cough: honey 1/2 to 1 teaspoon (you can dilute the honey in water or another fluid).  You can also use guaifenesin and dextromethorphan for cough. You can use a humidifier for chest congestion and cough.  If you don't have a humidifier, you can sit in the bathroom with the hot shower running.      For sore throat: try warm salt water gargles, cepacol lozenges, throat spray, warm tea or water with lemon/honey, popsicles or ice, or OTC cold relief medicine for throat discomfort.   For congestion: take a daily anti-histamine like Zyrtec, Claritin, and a oral decongestant, such as pseudoephedrine.  You can also use Flonase 1-2 sprays in each nostril daily.   It is important to stay hydrated: drink plenty of fluids (water, gatorade/powerade/pedialyte, juices, or teas) to keep your throat moisturized and help further relieve irritation/discomfort.

## 2023-11-16 ENCOUNTER — Encounter: Payer: Medicare Other | Admitting: Physical Therapy

## 2023-11-23 ENCOUNTER — Encounter: Payer: Medicare Other | Admitting: Physical Therapy

## 2023-11-24 ENCOUNTER — Encounter (INDEPENDENT_AMBULATORY_CARE_PROVIDER_SITE_OTHER): Payer: Self-pay | Admitting: Internal Medicine

## 2023-11-24 DIAGNOSIS — G4733 Obstructive sleep apnea (adult) (pediatric): Secondary | ICD-10-CM

## 2023-11-24 DIAGNOSIS — G4710 Hypersomnia, unspecified: Secondary | ICD-10-CM

## 2023-11-25 DIAGNOSIS — F17210 Nicotine dependence, cigarettes, uncomplicated: Secondary | ICD-10-CM | POA: Diagnosis not present

## 2023-11-25 DIAGNOSIS — J449 Chronic obstructive pulmonary disease, unspecified: Secondary | ICD-10-CM | POA: Diagnosis not present

## 2023-11-25 DIAGNOSIS — R350 Frequency of micturition: Secondary | ICD-10-CM | POA: Diagnosis not present

## 2023-11-25 DIAGNOSIS — Z87442 Personal history of urinary calculi: Secondary | ICD-10-CM | POA: Diagnosis not present

## 2023-11-25 DIAGNOSIS — R5383 Other fatigue: Secondary | ICD-10-CM | POA: Diagnosis not present

## 2023-11-25 DIAGNOSIS — R3912 Poor urinary stream: Secondary | ICD-10-CM | POA: Diagnosis not present

## 2023-11-30 ENCOUNTER — Encounter: Payer: Medicare Other | Admitting: Physical Therapy

## 2023-12-07 ENCOUNTER — Encounter: Payer: Medicare Other | Admitting: Physical Therapy

## 2023-12-07 NOTE — Procedures (Signed)
 Study Date: 11/24/2023  Patient Name: [name] [name], [name] [name] Recording Device: Tawana Scale  Sex: Male Height: 72.0 in.  D.O.B.: Mar 08, 1956 Weight: 224.0 lbs.  Age: 68 years B.M.I: 30.4 lb/in2   Times and Durations  Lights off clock time:  11:24:46 PM Total Recording Time (TRT): 362.0 minutes  Lights on clock time: 5:26:46 AM Time In Bed (TIB): 362.0 minutes    Monitoring Time (MT): 362.0 minutes   Device and Sensor Details  The study was recorded on a [employer] device using 1 RIP effort belt and a pressure-based flow sensor. The heart rate is derived from the oximeter sensor and the snore signal is derived from the pressure sensor. The device also records body position and uses it to determine the monitoring time (sleep/wake periods).   Summary  REI 16.4 OAI 9.9 CAI 0.0 Lowest Desat 82  REI is the number of respiratory events per hour. OAI is the number of obstructive apneas per hour. CAI is the number of central apneas per hour.  RESPIRATORY EVENTS   Index (#/hour) Total # of Events Mean duration  (sec) Max duration  (sec) # of Events by Position       Supine Prone Left Right Up  Central Apneas 0.0 0 0.0 0.0 0              Obstructive Apneas 9.9 60 21.2 53.0 60              Mixed Apneas 0.0 0 0.0 0.0 0              Hypopneas 6.5 39 30.3 51.5 39              Apneas + Hypopneas 16.4 99 24.8 53.0 99              RERAs 0.0 0 0.0 0.0 0              Total 16.4 99 24.8 53.0 99              Time in Position 362.0              REI in Position 16.4                Positional Summary   Supine Non-Supine  Total # of Events 99 0.00  Total Duration (minutes) 362.0 0.00  Sleep Duration (minutes) 362.0 0.00  REI in Position 16.4 0.00  REISupine / REINon-Supine Ratio 0.00   Positional Sleep Apnea is indicated if REI (supine) >= 2 x REI (non-supine) per Blain Pais, Sleep. 1984;7(2).      Oximetry Summary   Dur. (min) % TIB  <90 % 20.2 5.6  <85 % 10.1 2.8  <80 %  1.2 0.3  <70 % 1.2 0.3  Total Dur (min) < 89 14.4 min  Average (%) 92  Total # of Desats 166  Desat Index (#/hour) 29.8  Desat Max (%) 9  Desat Max dur (sec) 76.0  Lowest SpO2 % during sleep 82  Duration of Min SpO2 (sec) 75  Highest SpO2 % during sleep 98  Duration of Max SpO2(sec) 26    Heart Rate Stats  Mean HR during sleep 65.7 (BPM)  Highest HR during sleep 120  (BPM)  Highest HR during TIB  120 (BPM)  Lowest HR during sleep 58  (BPM)  Lowest HR during TIB 58 (BPM)    Snoring Summary  Total Snoring Episodes 8  Total Duration with Snoring 0.7 minutes  Mean Duration of Snoring 5.5 seconds  Percentage of Snoring 0.2 %            [facility]  Portable Polysomnogram Report Part 1 Phone: [phone] Fax: [phone]  Patient Name: [name] [name], [name] [name] Recording Device: Tawana Scale  D.O.B.: [birth year] Acquisition Number: [id number]  Referring Physician: Sallyanne Kuster, NP Acquisition Date: 11/24/2023   History: The patient is a 68 years old male who was referred for evaluation of possible sleep apnea with snoring and witnessed apnea.  Medical History: depression, headache, arthritis, COPD, history of MI.  Medications: albuterol, allopurinol, aspirin81, atorvastatin, bupropion, Fioricet, duloxetine, Trelegy, Norco, nitroglycerin, tamsulosin, tolterodine, Chantix.  PROCEDURE  The unattended portable polysomnogram was conducted on the night of 11/24/2023.  The following parameters were monitored: Nasal and oral airflow, and body position. Additionally, thoracic and abdominal movements were recorded by inductance plethysmography. Oxygen saturation (SpO2) and heart rate (ECG) was monitored using a pulse oximeter.  The tracing was scored using 30 second epochs. Hypopneas were scored per AASM definition VIII4.B (4% desaturation).   Description: The total recording time was 362.0 minutes. Sleep parameters are not recorded.  Respiratory monitoring  demonstrated  snoring across the night. All of the recording was in the supine position. There were a total of 99 apneas and hypopneas for a Respiratory Event Index of 16.4 apneas and hypopneas per hour of recording. The average duration of the respiratory events was 24.8 seconds with a maximum duration of 53.0 seconds. The respiratory events were associated with peripheral oxygen desaturations on the average to 92%. The lowest oxygen desaturation associated with a respiratory event was 82%. The total duration of oxygen < 90% was 20.2 minutes and <80% was 1.2 minutes.   Cardiac monitoring- The average heart rate during the recording was 65.7 bpm.  Impression: This routine overnight portable polysomnogram demonstrated significant obstructive sleep apnea. Overall the Respiratory Event Index was 16.4apneas and hypopneas per hour of recording with the lowest desaturation to 82%. All of the recording was in the supine position.  Recommendations:     A CPAP titration study is recommended due to the presence of obstructive sleep apnea. Some supine sleep should be ensured to optimize the titration. Weight loss is recommended in a patient with a BMI of 30.4.  Yevonne Pax MD [facility] Diplomate ABMS Pulmonary Critical Care and Sleep Medicine Electronically reviewed and digitally signed

## 2023-12-12 ENCOUNTER — Other Ambulatory Visit: Payer: Self-pay | Admitting: Cardiovascular Disease

## 2023-12-14 ENCOUNTER — Other Ambulatory Visit: Payer: Self-pay | Admitting: Nurse Practitioner

## 2023-12-14 DIAGNOSIS — Z79899 Other long term (current) drug therapy: Secondary | ICD-10-CM

## 2024-02-01 ENCOUNTER — Ambulatory Visit: Attending: Medical | Admitting: Medical

## 2024-02-01 ENCOUNTER — Encounter: Payer: Self-pay | Admitting: Medical

## 2024-02-01 VITALS — BP 111/70 | HR 64 | Ht 72.0 in | Wt 219.8 lb

## 2024-02-01 DIAGNOSIS — I2510 Atherosclerotic heart disease of native coronary artery without angina pectoris: Secondary | ICD-10-CM | POA: Diagnosis not present

## 2024-02-01 DIAGNOSIS — E785 Hyperlipidemia, unspecified: Secondary | ICD-10-CM

## 2024-02-01 DIAGNOSIS — Z720 Tobacco use: Secondary | ICD-10-CM | POA: Diagnosis not present

## 2024-02-01 NOTE — Progress Notes (Signed)
 Cardiology Office Note:  .   Date:  02/01/2024  ID:  [name], DOB [birth year], MRN [id number] PCP: Laurence Pons, NP  [facility] Providers Cardiologist:  None     History of Present Illness: .   [name] [name] is a 68 y.o. male  with a hx of COPD, tobacco use, HLD, depression and CAD who presents for 6 month follow-up.    He presented in August 2022 with NSTEMI. Cardiac cath showed significant 2V CAD with occluded RCA with well-developed L>R collaterals and 60% stenosis in the mid to distal left circumflex. EF was normal by echo. Elevated troponin was felt to be due to supply demand ischemia although plaque rupture in the mid to distal left circumflex could not be completely excluded. Plavix  was added.    The patient was last seen 07/2023 and was stable from a cardiac perspective.   Today, the patient has overall doing well. He goes to the gym 4 days a week. He denies chest pain, SOB, lower leg edema. He is smoking 1/2 ppd. Diet is healthy.   Studies Reviewed: Aaron Aas   EKG Interpretation Date/Time:  Wednesday Feb 01 2024 15:40:53 EDT Ventricular Rate:  64 PR Interval:  176 QRS Duration:  90 QT Interval:  422 QTC Calculation: 435 R Axis:   -53  Text Interpretation: Normal sinus rhythm Left axis deviation Low voltage QRS When compared with ECG of 08-Aug-2023 10:38, No significant change was found Confirmed by Gennaro Khat, [employer] ([id number]) on 02/01/2024 3:51:12 PM    LHC 2022    Prox RCA to Mid RCA lesion is 100% stenosed.   Mid LAD lesion is 30% stenosed.   Mid Cx to Dist Cx lesion is 60% stenosed.   1.  Significant two-vessel coronary artery disease.  Chronically occluded proximal to mid right coronary artery with well-developed left-to-right collaterals.  There is also 60% stenosis in the mid to distal left circumflex. 2.  Left ventricular angiography was not performed.  EF was normal by echo. 3.  Normal left ventricular end-diastolic pressure.    Recommendations: No clear culprit is identified for non-STEMI.  The RCA occlusion appears to be chronic with well-developed collaterals.  Cannot completely exclude possible plaque rupture in the mid to distal left circumflex but the lesion does not appear to be obstructive at this point. Recommend medical therapy.  I added clopidogrel  for 1 year.   Coronary Diagrams   Diagnostic Dominance: Right      Echo 04/2021   1. Left ventricular ejection fraction, by estimation, is 55 to 60%. The  left ventricle has normal function. The left ventricle has no regional  wall motion abnormalities. Left ventricular diastolic parameters are  consistent with Grade I diastolic  dysfunction (impaired relaxation).   2. Right ventricular systolic function is normal. The right ventricular  size is normal. Tricuspid regurgitation signal is inadequate for assessing  PA pressure.   3. Left atrial size was mildly dilated.        Physical Exam:   VS:  BP 111/70 (BP Location: Left Arm)   Pulse 64   Ht 6' (1.829 m)   Wt 219 lb 12.8 oz (99.7 kg)   SpO2 95%   BMI 29.81 kg/m    Wt Readings from Last 3 Encounters:  02/01/24 219 lb 12.8 oz (99.7 kg)  10/17/23 221 lb 12.8 oz (100.6 kg)  09/15/23 224 lb 9.6 oz (101.9 kg)    GEN: Well nourished, well developed in no acute distress NECK: No JVD; No  carotid bruits CARDIAC: RRR, no murmurs, rubs, gallops RESPIRATORY:  Clear to auscultation without rales, wheezing or rhonchi  ABDOMEN: Soft, non-tender, non-distended EXTREMITIES:  No edema; No deformity   ASSESSMENT AND PLAN: .    CAD He has CTO of the RCA with well developed collaterals and 60% stenosis of the mid to distal left Cx by cath in 2022. He denies anginal symptoms. He works out 4 days a week and has no symptoms with this. Continue ASA 81mg  daily, Lipitor  80mg  daily and SL NTG.   HLD LDL 55. Continue Lipitor  80mg  daily.   Tobacco use He is smoking 1/2 ppd. Cessation recommended       Dispo:  Follow-up in 1 year  Signed, Britaney Espaillat Rebekah Canada, PA-C

## 2024-02-01 NOTE — Patient Instructions (Signed)
 Medication Instructions:  Your Physician recommend you continue on your current medication as directed.    *If you need a refill on your cardiac medications before your next appointment, please call your pharmacy*  Lab Work: No labs ordered today  If you have labs (blood work) drawn today and your tests are completely normal, you will receive your results only by: MyChart Message (if you have MyChart) OR A paper copy in the mail If you have any lab test that is abnormal or we need to change your treatment, we will call you to review the results.  Testing/Procedures: No test ordered today   Follow-Up: At [facility], you and your health needs are our priority.  As part of our continuing mission to provide you with exceptional heart care, our providers are all part of one team.  This team includes your primary Cardiologist (physician) and Advanced Practice Providers or APPs (Physician Assistants and Nurse Practitioners) who all work together to provide you with the care you need, when you need it.  Your next appointment:   1 year(s)  Provider:   Toribio Frees, PA-C

## 2024-02-06 ENCOUNTER — Other Ambulatory Visit: Payer: Self-pay | Admitting: Nurse Practitioner

## 2024-02-06 DIAGNOSIS — Z79899 Other long term (current) drug therapy: Secondary | ICD-10-CM

## 2024-02-06 NOTE — Telephone Encounter (Signed)
 Please review ok to fill

## 2024-02-12 ENCOUNTER — Other Ambulatory Visit: Payer: Self-pay | Admitting: Nurse Practitioner

## 2024-02-12 DIAGNOSIS — F320 Major depressive disorder, single episode, mild: Secondary | ICD-10-CM

## 2024-02-14 ENCOUNTER — Other Ambulatory Visit: Payer: Self-pay

## 2024-02-14 DIAGNOSIS — F320 Major depressive disorder, single episode, mild: Secondary | ICD-10-CM

## 2024-02-14 MED ORDER — DULOXETINE HCL 60 MG PO CPEP: 60.0000 mg | ORAL_CAPSULE | Freq: Every day | ORAL | 3 refills | Status: AC

## 2024-02-19 ENCOUNTER — Other Ambulatory Visit: Payer: Self-pay | Admitting: Nurse Practitioner

## 2024-02-19 DIAGNOSIS — Z8739 Personal history of other diseases of the musculoskeletal system and connective tissue: Secondary | ICD-10-CM

## 2024-02-28 ENCOUNTER — Ambulatory Visit (INDEPENDENT_AMBULATORY_CARE_PROVIDER_SITE_OTHER): Admitting: Nurse Practitioner

## 2024-02-28 ENCOUNTER — Encounter: Payer: Self-pay | Admitting: Nurse Practitioner

## 2024-02-28 VITALS — BP 116/72 | HR 73 | Temp 98.1°F | Resp 16 | Ht 72.0 in | Wt 218.6 lb

## 2024-02-28 DIAGNOSIS — G3184 Mild cognitive impairment, so stated: Secondary | ICD-10-CM | POA: Diagnosis not present

## 2024-02-28 DIAGNOSIS — G8929 Other chronic pain: Secondary | ICD-10-CM | POA: Diagnosis not present

## 2024-02-28 DIAGNOSIS — M545 Low back pain, unspecified: Secondary | ICD-10-CM | POA: Diagnosis not present

## 2024-02-28 DIAGNOSIS — M25562 Pain in left knee: Secondary | ICD-10-CM | POA: Diagnosis not present

## 2024-02-28 MED ORDER — HYDROCODONE-ACETAMINOPHEN 5-325 MG PO TABS: 1.0000 | ORAL_TABLET | Freq: Four times a day (QID) | ORAL | 0 refills | Status: DC | PRN

## 2024-02-28 NOTE — Progress Notes (Signed)
 [facility] [address] [place], [place] [address]  Internal MEDICINE  Office Visit Note  Patient Name: [name] [name]  [id number]  [id number]  Date of Service: 02/28/2024  Chief Complaint  Patient presents with   Acute Visit    Period of confusion      HPI [name] [name] presents for an acute sick visit for memory issues Was driving to a church he has gone to frequently and forgot how to get there.  Has often gone into different rooms in the house and forgotten why he has gone in there.  Sometimes misplacing items more often and forgetting previous conversations.   Has right sided low back pain that acts up when he is driving for prolonged periods of time, asking for a small amount of pain medication for his upcoming trip this month to [employer].   Current Medication:  Outpatient Encounter Medications as of 02/28/2024  Medication Sig   HYDROcodone -acetaminophen  (NORCO/VICODIN) 5-325 MG tablet Take 1 tablet by mouth every 6 (six) hours as needed for up to 5 days for moderate pain (pain score 4-6) or severe pain (pain score 7-10).   albuterol  (VENTOLIN  HFA) 108 (90 Base) MCG/ACT inhaler Inhale 2 puffs into the lungs every 6 (six) hours as needed for wheezing or shortness of breath.   allopurinol  (ZYLOPRIM ) 100 MG tablet TAKE 2 TABLETS BY MOUTH DAILY   aspirin  EC 81 MG EC tablet Take 1 tablet (81 mg total) by mouth daily. Swallow whole.   atorvastatin  (LIPITOR ) 80 MG tablet TAKE 1 TABLET(80 MG) BY MOUTH DAILY   buPROPion  (WELLBUTRIN  SR) 150 MG 12 hr tablet TAKE 1 TABLET(150 MG) BY MOUTH TWICE DAILY   butalbital -acetaminophen -caffeine  (FIORICET) 50-325-40 MG tablet Take 1-2 tablets by mouth 2 (two) times daily as needed for headache.   DULoxetine  (CYMBALTA ) 60 MG capsule Take 1 capsule (60 mg total) by mouth daily.   Fluticasone -Umeclidin-Vilant (TRELEGY ELLIPTA ) 100-62.5-25 MCG/ACT AEPB TAKE 1 INHALATION INTO THE LUNGS BY MOUTH DAILY   ibuprofen  (ADVIL ) 800 MG tablet Take  1 tablet (800 mg total) by mouth every 8 (eight) hours as needed for moderate pain.   nitroGLYCERIN  (NITROSTAT ) 0.4 MG SL tablet Place 1 tablet (0.4 mg total) under the tongue every 5 (five) minutes as needed for chest pain.   tadalafil (CIALIS) 10 MG tablet Take 20 mg by mouth daily as needed.   tamsulosin  (FLOMAX ) 0.4 MG CAPS capsule TAKE 1 CAPSULE(0.4 MG) BY MOUTH DAILY   tolterodine  (DETROL  LA) 4 MG 24 hr capsule TAKE 1 CAPSULE BY MOUTH DAILY   varenicline  (CHANTIX ) 1 MG tablet TAKE 1 TABLET(1 MG) BY MOUTH TWICE DAILY   [DISCONTINUED] traMADol  (ULTRAM ) 50 MG tablet Take 50 mg by mouth as needed for moderate pain (pain score 4-6).   No facility-administered encounter medications on file as of 02/28/2024.      Medical History: Past Medical History:  Diagnosis Date   Arthritis    COPD (chronic obstructive pulmonary disease) (HCC)    Depression    Emphysema of lung (HCC)    Gout    left knee   Heart attack (HCC)    Kidney stones    bilateral     Vital Signs: BP 116/72   Pulse 73   Temp 98.1 F (36.7 C)   Resp 16   Ht 6' (1.829 m)   Wt 218 lb 9.6 oz (99.2 kg)   SpO2 96%   BMI 29.65 kg/m    Review of Systems  Constitutional:  Negative for  chills, fatigue and unexpected weight change.  HENT:  Positive for postnasal drip. Negative for congestion, rhinorrhea, sneezing and sore throat.   Eyes:  Negative for redness.  Respiratory:  Negative for cough, chest tightness and shortness of breath.   Cardiovascular:  Negative for chest pain and palpitations.  Gastrointestinal:  Negative for abdominal pain, constipation, diarrhea, nausea and vomiting.  Genitourinary:  Negative for dysuria and frequency.  Musculoskeletal:  Positive for arthralgias and back pain. Negative for joint swelling and neck pain.  Skin:  Negative for rash.  Neurological: Negative.  Negative for tremors and numbness.  Hematological:  Negative for adenopathy. Does not bruise/bleed easily.   Psychiatric/Behavioral:  Positive for confusion (memory issuese). Negative for behavioral problems (Depression), self-injury, sleep disturbance and suicidal ideas. The patient is not nervous/anxious.     Physical Exam Vitals reviewed.  Constitutional:      General: He is not in acute distress.    Appearance: Normal appearance. He is not ill-appearing.  HENT:     Head: Normocephalic and atraumatic.  Eyes:     Pupils: Pupils are equal, round, and reactive to light.  Cardiovascular:     Rate and Rhythm: Normal rate and regular rhythm.  Pulmonary:     Effort: Pulmonary effort is normal. No respiratory distress.  Neurological:     Mental Status: He is alert and oriented to person, place, and time.  Psychiatric:        Mood and Affect: Mood normal.        Behavior: Behavior normal.        Cognition and Memory: Cognition is impaired. Memory is impaired. He exhibits impaired recent memory and impaired remote memory.       Assessment/Plan: 1. Mild cognitive impairment with memory loss (Primary) Referred to [facility] clinic neurology. - Ambulatory referral to Neurology  2. Chronic right-sided low back pain without sciatica Pain medication prescribed, take as needed for upcoming trip  - HYDROcodone -acetaminophen  (NORCO/VICODIN) 5-325 MG tablet; Take 1 tablet by mouth every 6 (six) hours as needed for up to 5 days for moderate pain (pain score 4-6) or severe pain (pain score 7-10).  Dispense: 20 tablet; Refill: 0  3. Chronic pain of left knee Pain medication prescribed, take as needed  - HYDROcodone -acetaminophen  (NORCO/VICODIN) 5-325 MG tablet; Take 1 tablet by mouth every 6 (six) hours as needed for up to 5 days for moderate pain (pain score 4-6) or severe pain (pain score 7-10).  Dispense: 20 tablet; Refill: 0   General Counseling: [name] [name] verbalizes understanding of the findings of todays visit and agrees with plan of treatment. I have discussed any further diagnostic evaluation  that may be needed or ordered today. We also reviewed his medications today. he has been encouraged to call the office with any questions or concerns that should arise related to todays visit.    Counseling:    Orders Placed This Encounter  Procedures   Ambulatory referral to Neurology    Meds ordered this encounter  Medications   HYDROcodone -acetaminophen  (NORCO/VICODIN) 5-325 MG tablet    Sig: Take 1 tablet by mouth every 6 (six) hours as needed for up to 5 days for moderate pain (pain score 4-6) or severe pain (pain score 7-10).    Dispense:  20 tablet    Refill:  0    Fill new script today    Return for push june 23 visit out to late july for routine follow up .  [place] Controlled Substance Database was reviewed by  me for overdose risk score (ORS)  Time spent:30 Minutes Time spent with patient included reviewing progress notes, labs, imaging studies, and discussing plan for follow up.   This patient was seen by Laurence Pons, FNP-C in collaboration with Dr. Verneta Gone as a part of collaborative care agreement.  [name] [name] R. Bobbi Burow, MSN, FNP-C Internal Medicine

## 2024-02-29 ENCOUNTER — Telehealth: Payer: Self-pay | Admitting: Nurse Practitioner

## 2024-02-29 NOTE — Telephone Encounter (Signed)
 Urgent Neurology referral sent via [facility] to [facility].  Lvm notifying patient. Gave pt telephone# [phone]-Toni

## 2024-03-02 ENCOUNTER — Other Ambulatory Visit: Payer: Self-pay | Admitting: Nurse Practitioner

## 2024-03-02 DIAGNOSIS — Z79899 Other long term (current) drug therapy: Secondary | ICD-10-CM

## 2024-03-02 NOTE — Telephone Encounter (Signed)
 Please review

## 2024-03-12 ENCOUNTER — Ambulatory Visit: Payer: Medicare Other | Admitting: Nurse Practitioner

## 2024-03-12 ENCOUNTER — Telehealth: Payer: Self-pay | Admitting: Nurse Practitioner

## 2024-03-12 NOTE — Telephone Encounter (Signed)
 Neurology appointment 04/02/2024 w/ [facility]

## 2024-03-15 ENCOUNTER — Other Ambulatory Visit: Payer: Self-pay | Admitting: Cardiovascular Disease

## 2024-03-16 DIAGNOSIS — Z299 Encounter for prophylactic measures, unspecified: Secondary | ICD-10-CM | POA: Diagnosis not present

## 2024-04-02 ENCOUNTER — Other Ambulatory Visit: Payer: Self-pay | Admitting: Nurse Practitioner

## 2024-04-02 DIAGNOSIS — G3184 Mild cognitive impairment, so stated: Secondary | ICD-10-CM | POA: Diagnosis not present

## 2024-04-02 DIAGNOSIS — R6889 Other general symptoms and signs: Secondary | ICD-10-CM | POA: Diagnosis not present

## 2024-04-02 DIAGNOSIS — G8929 Other chronic pain: Secondary | ICD-10-CM

## 2024-04-02 DIAGNOSIS — G20C Parkinsonism, unspecified: Secondary | ICD-10-CM | POA: Diagnosis not present

## 2024-04-02 DIAGNOSIS — R569 Unspecified convulsions: Secondary | ICD-10-CM | POA: Diagnosis not present

## 2024-04-02 DIAGNOSIS — G479 Sleep disorder, unspecified: Secondary | ICD-10-CM | POA: Diagnosis not present

## 2024-04-02 MED ORDER — HYDROCODONE-ACETAMINOPHEN 5-325 MG PO TABS: 1.0000 | ORAL_TABLET | Freq: Four times a day (QID) | ORAL | 0 refills | Status: DC | PRN

## 2024-04-03 ENCOUNTER — Other Ambulatory Visit: Payer: Self-pay | Admitting: Neurology

## 2024-04-03 DIAGNOSIS — R569 Unspecified convulsions: Secondary | ICD-10-CM

## 2024-04-03 DIAGNOSIS — G3184 Mild cognitive impairment, so stated: Secondary | ICD-10-CM

## 2024-04-11 ENCOUNTER — Ambulatory Visit
Admission: RE | Admit: 2024-04-11 | Discharge: 2024-04-11 | Disposition: A | Discharge status: Home / Self Care | Source: Ambulatory Visit | Attending: Neurology | Admitting: Neurology

## 2024-04-11 DIAGNOSIS — R569 Unspecified convulsions: Secondary | ICD-10-CM | POA: Insufficient documentation

## 2024-04-11 DIAGNOSIS — G319 Degenerative disease of nervous system, unspecified: Secondary | ICD-10-CM | POA: Diagnosis not present

## 2024-04-11 DIAGNOSIS — G3184 Mild cognitive impairment, so stated: Secondary | ICD-10-CM | POA: Diagnosis not present

## 2024-04-11 DIAGNOSIS — I6782 Cerebral ischemia: Secondary | ICD-10-CM | POA: Diagnosis not present

## 2024-04-16 ENCOUNTER — Telehealth: Payer: Self-pay | Admitting: Nurse Practitioner

## 2024-04-16 NOTE — Telephone Encounter (Signed)
 Pt advised [name] [name] is unable to med he is not due until next month

## 2024-04-18 ENCOUNTER — Ambulatory Visit: Admitting: Nurse Practitioner

## 2024-05-01 ENCOUNTER — Ambulatory Visit (INDEPENDENT_AMBULATORY_CARE_PROVIDER_SITE_OTHER): Admitting: Nurse Practitioner

## 2024-05-01 ENCOUNTER — Encounter: Payer: Self-pay | Admitting: Nurse Practitioner

## 2024-05-01 VITALS — BP 110/70 | HR 91 | Temp 98.3°F | Resp 16 | Ht 72.0 in | Wt 209.0 lb

## 2024-05-01 DIAGNOSIS — M25562 Pain in left knee: Secondary | ICD-10-CM

## 2024-05-01 DIAGNOSIS — G8929 Other chronic pain: Secondary | ICD-10-CM

## 2024-05-01 DIAGNOSIS — M545 Low back pain, unspecified: Secondary | ICD-10-CM

## 2024-05-01 DIAGNOSIS — R7303 Prediabetes: Secondary | ICD-10-CM

## 2024-05-01 DIAGNOSIS — G3184 Mild cognitive impairment, so stated: Secondary | ICD-10-CM | POA: Diagnosis not present

## 2024-05-01 MED ORDER — HYDROCODONE-ACETAMINOPHEN 5-325 MG PO TABS: 1.0000 | ORAL_TABLET | Freq: Every day | ORAL | 0 refills | Status: DC | PRN

## 2024-05-01 NOTE — Progress Notes (Signed)
 [facility] [address] [place], [place] [address]  Internal MEDICINE  Office Visit Note  Patient Name: [name] [name]  [id number]  [id number]  Date of Service: 05/01/2024  Chief Complaint  Patient presents with   Depression   Follow-up    HPI [name] [name] presents for a follow-up visit for chronic pain, memory issues, and refills.  Chronic low back pain and chronic knee pain -- takes hydrocodone  as needed.  Recent prostate biopsy -- waiting to discuss results with [facility] urology.  Mild cognitive impairment with memory loss -- has been seen by neurology and recently had an MRI of the brain. There is concern for parkinson's disease per the notes from the neurology visit. He will have an EEG done later this month and has a follow up visit with Dr. Maree later on.  Due for repeat lung cancer screening in October. Still smokes some, has tried multiple courses of chantix . He has been able to cut down to 4 cigarettes per day at the least but this varies depending on if he is taking chantix  at the time or not.  Prediabetes -- last A1c was in January and remains stable at 6.0.   Current Medication: Outpatient Encounter Medications as of 05/01/2024  Medication Sig   albuterol  (VENTOLIN  HFA) 108 (90 Base) MCG/ACT inhaler Inhale 2 puffs into the lungs every 6 (six) hours as needed for wheezing or shortness of breath.   aspirin  EC 81 MG EC tablet Take 1 tablet (81 mg total) by mouth daily. Swallow whole.   atorvastatin  (LIPITOR ) 80 MG tablet TAKE 1 TABLET(80 MG) BY MOUTH DAILY   buPROPion  (WELLBUTRIN  SR) 150 MG 12 hr tablet TAKE 1 TABLET(150 MG) BY MOUTH TWICE DAILY   butalbital -acetaminophen -caffeine  (FIORICET) 50-325-40 MG tablet Take 1-2 tablets by mouth 2 (two) times daily as needed for headache.   DULoxetine  (CYMBALTA ) 60 MG capsule Take 1 capsule (60 mg total) by mouth daily.   Fluticasone -Umeclidin-Vilant (TRELEGY ELLIPTA ) 100-62.5-25 MCG/ACT AEPB TAKE 1 INHALATION INTO THE LUNGS BY  MOUTH DAILY   HYDROcodone -acetaminophen  (NORCO/VICODIN) 5-325 MG tablet Take 1 tablet by mouth daily as needed for moderate pain (pain score 4-6) or severe pain (pain score 7-10).   [START ON 05/29/2024] HYDROcodone -acetaminophen  (NORCO/VICODIN) 5-325 MG tablet Take 1 tablet by mouth daily as needed for moderate pain (pain score 4-6) or severe pain (pain score 7-10).   ibuprofen  (ADVIL ) 800 MG tablet Take 1 tablet (800 mg total) by mouth every 8 (eight) hours as needed for moderate pain.   nitroGLYCERIN  (NITROSTAT ) 0.4 MG SL tablet Place 1 tablet (0.4 mg total) under the tongue every 5 (five) minutes as needed for chest pain.   tadalafil (CIALIS) 10 MG tablet Take 20 mg by mouth daily as needed.   tamsulosin  (FLOMAX ) 0.4 MG CAPS capsule TAKE 1 CAPSULE(0.4 MG) BY MOUTH DAILY   tolterodine  (DETROL  LA) 4 MG 24 hr capsule TAKE 1 CAPSULE BY MOUTH DAILY   varenicline  (CHANTIX ) 1 MG tablet TAKE 1 TABLET(1 MG) BY MOUTH TWICE DAILY   [DISCONTINUED] allopurinol  (ZYLOPRIM ) 100 MG tablet TAKE 2 TABLETS BY MOUTH DAILY   [DISCONTINUED] HYDROcodone -acetaminophen  (NORCO/VICODIN) 5-325 MG tablet Take 1 tablet by mouth every 6 (six) hours as needed for moderate pain (pain score 4-6) or severe pain (pain score 7-10).   No facility-administered encounter medications on file as of 05/01/2024.    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    Medical History: Past Medical History:  Diagnosis Date   Arthritis    COPD  (chronic obstructive pulmonary disease) (HCC)    Depression    Emphysema of lung (HCC)    Gout    left knee   Heart attack (HCC)    Kidney stones    bilateral    Family History: Family History  Problem Relation Age of Onset   Alcohol abuse Mother    Alcohol abuse Father    Alcohol abuse Brother     Social History   Socioeconomic History   Marital status: Married    Spouse name: Not on file   Number of children: Not on file   Years of education: Not on file   Highest education level: Not on file  Occupational History   Not on file  Tobacco Use   Smoking status: Every Day    Current packs/day: 2.00    Average packs/day: 2.0 packs/day for 52.0 years (104.0 ttl pk-yrs)    Types: Cigarettes   Smokeless tobacco: Never   Tobacco comments:    1/2 pack daily  Vaping Use   Vaping status: Never Used  Substance and Sexual Activity   Alcohol use: Not Currently   Drug use: Never   Sexual activity: Not on file  Other Topics Concern   Not on file  Social History Narrative   Not on file   Social Drivers of Health   Financial Resource Strain: Low Risk  (04/02/2024)   Received from [facility]   Overall Financial Resource Strain (CARDIA)    Difficulty of Paying Living Expenses: Not hard at all  Food Insecurity: No Food Insecurity (04/02/2024)   Received from [facility]   Hunger Vital Sign    Within the past 12 months, you worried that your food would run out before you got the money to buy more.: Never true    Within the past 12 months, the food you bought just didn't last and you didn't have money to get more.: Never true  Transportation Needs: No Transportation Needs (04/02/2024)   Received from [facility] - Transportation    In the past 12 months, has lack of transportation kept you from medical appointments or from getting medications?: No    Lack of Transportation (Non-Medical): No  Physical Activity: Not  on file  Stress: Not on file  Social Connections: Not on file  Intimate Partner Violence: Not on file      Review of Systems  Constitutional:  Negative for chills, fatigue and unexpected weight change.  HENT:  Positive for postnasal drip. Negative for congestion, rhinorrhea, sneezing and sore throat.   Eyes:  Negative for redness.  Respiratory:  Negative for cough, chest tightness and shortness of breath.   Cardiovascular:  Negative for chest pain and palpitations.  Gastrointestinal:  Negative for abdominal pain, constipation, diarrhea, nausea and vomiting.  Genitourinary:  Negative for dysuria and frequency.  Musculoskeletal:  Positive for arthralgias and back pain. Negative for joint swelling and neck pain.  Skin:  Negative for rash.  Neurological: Negative.  Negative for tremors and numbness.  Hematological:  Negative for adenopathy. Does not bruise/bleed easily.  Psychiatric/Behavioral:  Positive for confusion (memory issuese). Negative for behavioral problems (Depression), self-injury, sleep disturbance and suicidal ideas. The patient is not nervous/anxious.     Vital Signs: BP 110/70   Pulse 91   Temp 98.3 F (36.8 C)   Resp 16   Ht 6' (1.829 m)   Wt 209 lb (94.8 kg)   SpO2 95%   BMI 28.35 kg/m    Physical Exam Vitals reviewed.  Constitutional:      General: He is not in acute distress.    Appearance: Normal appearance. He is not ill-appearing.  HENT:     Head: Normocephalic and atraumatic.  Eyes:     Pupils: Pupils are equal, round, and reactive to light.  Cardiovascular:     Rate and Rhythm: Normal rate and regular rhythm.  Pulmonary:     Effort: Pulmonary effort is normal. No respiratory distress.  Neurological:     Mental Status: He is alert and oriented to person, place, and time.  Psychiatric:        Mood and Affect: Mood normal.        Behavior: Behavior normal.        Cognition and Memory: Cognition is impaired. Memory is impaired. He exhibits  impaired recent memory and impaired remote memory.        Assessment/Plan: 1. Chronic right-sided low back pain without sciatica Continue prn hydrocodone  as prescribed, follow up in a couple months for additional refills  - HYDROcodone -acetaminophen  (NORCO/VICODIN) 5-325 MG tablet; Take 1 tablet by mouth daily as needed for moderate pain (pain score 4-6) or severe pain (pain score 7-10).  Dispense: 30 tablet; Refill: 0 - HYDROcodone -acetaminophen  (NORCO/VICODIN) 5-325 MG tablet; Take 1 tablet by mouth daily as needed for moderate pain (pain score 4-6) or severe pain (pain score 7-10).  Dispense: 30 tablet; Refill: 0  2. Chronic pain of left knee Continue prn hydrocodone  as prescribed, follow up in a couple months for additional refills  - HYDROcodone -acetaminophen  (NORCO/VICODIN) 5-325 MG tablet; Take 1 tablet by mouth daily as needed for moderate pain (pain score 4-6) or severe pain (pain score 7-10).  Dispense: 30 tablet; Refill: 0 - HYDROcodone -acetaminophen  (NORCO/VICODIN) 5-325 MG tablet; Take 1 tablet by mouth daily as needed for moderate pain (pain score 4-6) or severe pain (pain score 7-10).  Dispense: 30 tablet; Refill: 0  3. Mild cognitive impairment with memory loss (Primary) Continue work up and following up with neurology.   4. Prediabetes Stable, will check A1c with next set of labs    General Counseling: Siddhant verbalizes understanding of the findings of todays visit and agrees with plan of treatment. I have discussed any further diagnostic evaluation that may be needed or ordered today. We also reviewed his medications today. he has been encouraged to call the office with any questions or concerns that should arise related to todays visit.    No orders of the defined types were placed in this encounter.   Meds ordered this encounter  Medications   HYDROcodone -acetaminophen  (NORCO/VICODIN) 5-325 MG tablet    Sig: Take 1 tablet by mouth daily as needed for moderate pain  (pain score 4-6) or severe pain (pain score 7-10).    Dispense:  30 tablet    Refill:  0    Refill please now   HYDROcodone -acetaminophen  (NORCO/VICODIN) 5-325 MG tablet    Sig: Take 1 tablet by mouth daily as needed for  moderate pain (pain score 4-6) or severe pain (pain score 7-10).    Dispense:  30 tablet    Refill:  0    Refill please for September    Return in about 2 months (around 07/01/2024) for F/U, pain med refill, Keondra Haydu PCP.   Total time spent:30 Minutes Time spent includes review of chart, medications, test results, and follow up plan with the patient.   [place]  Controlled Substance Database was reviewed by me.  This patient was seen by Mardy Maxin, FNP-C in collaboration with Dr. Sigrid Bathe as a part of collaborative care agreement.   Aelyn Stanaland R. Maxin, MSN, FNP-C Internal medicine

## 2024-05-25 ENCOUNTER — Other Ambulatory Visit: Payer: Self-pay | Admitting: Nurse Practitioner

## 2024-05-25 DIAGNOSIS — Z8739 Personal history of other diseases of the musculoskeletal system and connective tissue: Secondary | ICD-10-CM

## 2024-05-27 ENCOUNTER — Encounter: Payer: Self-pay | Admitting: Nurse Practitioner

## 2024-05-27 DIAGNOSIS — M545 Low back pain, unspecified: Secondary | ICD-10-CM | POA: Insufficient documentation

## 2024-05-27 DIAGNOSIS — G8929 Other chronic pain: Secondary | ICD-10-CM | POA: Insufficient documentation

## 2024-06-10 ENCOUNTER — Other Ambulatory Visit: Payer: Self-pay | Admitting: Nurse Practitioner

## 2024-06-10 DIAGNOSIS — Z79899 Other long term (current) drug therapy: Secondary | ICD-10-CM

## 2024-07-03 ENCOUNTER — Encounter: Payer: Self-pay | Admitting: Internal Medicine

## 2024-07-03 ENCOUNTER — Telehealth: Payer: Self-pay | Admitting: Internal Medicine

## 2024-07-03 ENCOUNTER — Ambulatory Visit (INDEPENDENT_AMBULATORY_CARE_PROVIDER_SITE_OTHER): Payer: Medicare Other | Admitting: Internal Medicine

## 2024-07-03 VITALS — BP 126/93 | HR 60 | Temp 98.0°F | Resp 16 | Ht 72.0 in | Wt 209.0 lb

## 2024-07-03 DIAGNOSIS — R0602 Shortness of breath: Secondary | ICD-10-CM

## 2024-07-03 DIAGNOSIS — J4489 Other specified chronic obstructive pulmonary disease: Secondary | ICD-10-CM | POA: Diagnosis not present

## 2024-07-03 DIAGNOSIS — G4733 Obstructive sleep apnea (adult) (pediatric): Secondary | ICD-10-CM | POA: Diagnosis not present

## 2024-07-03 DIAGNOSIS — Z7722 Contact with and (suspected) exposure to environmental tobacco smoke (acute) (chronic): Secondary | ICD-10-CM

## 2024-07-03 NOTE — Patient Instructions (Signed)
 Steps to Quit Smoking Smoking tobacco is the leading cause of preventable death. It can affect almost every organ in the body. Smoking puts you and people around you at risk for many serious, long-lasting (chronic) diseases. Quitting smoking can be hard, but it is one of the best things that you can do for your health. It is never too late to quit. Do not give up if you cannot quit the first time. Some people need to try many times to quit. Do your best to stick to your quit plan, and talk with your doctor if you have any questions or concerns. How do I get ready to quit? Pick a date to quit. Set a date within the next 2 weeks to give you time to prepare. Write down the reasons why you are quitting. Keep this list in places where you will see it often. Tell your family, friends, and co-workers that you are quitting. Their support is important. Talk with your doctor about the choices that may help you quit. Find out if your health insurance will pay for these treatments. Know the people, places, things, and activities that make you want to smoke (triggers). Avoid them. What first steps can I take to quit smoking? Throw away all cigarettes at home, at work, and in your car. Throw away the things that you use when you smoke, such as ashtrays and lighters. Clean your car. Empty the ashtray. Clean your home, including curtains and carpets. What can I do to help me quit smoking? Talk with your doctor about taking medicines and seeing a counselor. You are more likely to succeed when you do both. If you are pregnant or breastfeeding: Talk with your doctor about counseling or other ways to quit smoking. Do not take medicine to help you quit smoking unless your doctor tells you to. Quit right away Quit smoking completely, instead of slowly cutting back on how much you smoke over a period of time. Stopping smoking right away may be more successful than slowly quitting. Go to counseling. In-person is best  if this is an option. You are more likely to quit if you go to counseling sessions regularly. Take medicine You may take medicines to help you quit. Some medicines need a prescription, and some you can buy over-the-counter. Some medicines may contain a drug called nicotine  to replace the nicotine  in cigarettes. Medicines may: Help you stop having the desire to smoke (cravings). Help to stop the problems that come when you stop smoking (withdrawal symptoms). Your doctor may ask you to use: Nicotine  patches, gum, or lozenges. Nicotine  inhalers or sprays. Non-nicotine  medicine that you take by mouth. Find resources Find resources and other ways to help you quit smoking and remain smoke-free after you quit. They include: Online chats with a Veterinary surgeon. Phone quitlines. Printed Materials engineer. Support groups or group counseling. Text messaging programs. Mobile phone apps. Use apps on your mobile phone or tablet that can help you stick to your quit plan. Examples of free services include Quit Guide from the CDC and smokefree.gov  What can I do to make it easier to quit?  Talk to your family and friends. Ask them to support and encourage you. Call a phone quitline, such as 1-800-QUIT-NOW, reach out to support groups, or work with a Veterinary surgeon. Ask people who smoke to not smoke around you. Avoid places that make you want to smoke, such as: Bars. Parties. Smoke-break areas at work. Spend time with people who do not smoke. Lower  the stress in your life. Stress can make you want to smoke. Try these things to lower stress: Getting regular exercise. Doing deep-breathing exercises. Doing yoga. Meditating. What benefits will I see if I quit smoking? Over time, you may have: A better sense of smell and taste. Less coughing and sore throat. A slower heart rate. Lower blood pressure. Clearer skin. Better breathing. Fewer sick days. Summary Quitting smoking can be hard, but it is one of  the best things that you can do for your health. Do not give up if you cannot quit the first time. Some people need to try many times to quit. When you decide to quit smoking, make a plan to help you succeed. Quit smoking right away, not slowly over a period of time. When you start quitting, get help and support to keep you smoke-free. This information is not intended to replace advice given to you by your health care provider. Make sure you discuss any questions you have with your health care provider. Document Revised: 08/28/2021 Document Reviewed: 08/28/2021 Elsevier Patient Education  2024 Elsevier Inc.Chronic Obstructive Pulmonary Disease  Chronic obstructive pulmonary disease (COPD) is a long-term (chronic) lung problem. When you have COPD, it can feel harder to breathe in or out. The condition may get worse over time. There are things you can do to keep yourself as healthy as possible. What are the causes? Smoking. This is the most common cause. Breathing in fumes, smoke, or chemicals for a long time. Genes that are inherited, which means they are passed down from parent to child. What are the signs or symptoms? Shortness of breath. This may happen all the time. This may get worse when you move your body. This may get worse over time. You may have times when this becomes much worse all of a sudden. These are called flare-ups or exacerbations. A long-term cough, with or without thick mucus. Wheezing. Chest tightness. Feeling tired. Not being able to do activities like you used to do. How is this diagnosed? This condition is diagnosed based on: Your medical history. A physical exam. Lung (pulmonary) function tests. You may have a test that measures the air flow out of the lungs when you breathe out. You may also have tests, including: Chest X-ray. CT scan. Blood tests. How is this treated? This condition may be treated by: Quitting smoking, if you smoke. Using  oxygen. Taking medicines. These may include: Inhalers. These have medicines in them that you breathe in. Daily inhalers. These help to prevent symptoms from happening. They are usually taken every day to prevent COPD flare-ups. Quick relief inhalers. These act fast to relieve symptoms. They are used only when needed and provide short-term relief. Other medicines that you breathe in or swallow. These may be used to open the airways, thin mucus, or treat infections. Breathing exercises to help you control or catch your breath. A mucus clearing device, if you have a lot of thick mucus. Pulmonary rehab. A place where you will learn about your condition and the best ways for you to manage it. Surgery. Follow these instructions at home: Medicines Take your medicines as told by your health care provider. Talk to your provider before taking any cough or allergy medicines. You may need to avoid medicines that cause your lungs to be dry. Lifestyle Several times a day, wash your hands with soap and water for at least 20 seconds. If you cannot use soap and water, use hand sanitizer. This may help keep you  from getting an infection. Avoid being around crowds or people who are sick. Do not smoke or use any products that contain nicotine  or tobacco. If you need help quitting, ask your provider. Stay active. Learn how to pace your activity during the day. Learn how to breathe to control your stress and catch your breath. Drink enough fluid to keep your pee (urine) pale yellow, unless you have been told not to. Eat healthy foods. Eat smaller meals more often. Get enough sleep. Most adults need 7 or more hours per night. General instructions Make a COPD action plan with your provider. This helps you to know what to do if you feel worse than usual. Make sure you get all the shots, also called vaccines, that your provider recommends. Ask your provider about a flu shot and a pneumonia shot. If you need home  oxygen therapy, ask your provider how often to check your oxygen level with a device called an oximeter. Keep all follow-up visits to review your COPD action plan. Your provider will want to check on your condition often to keep you healthy and out of the hospital. Contact a health care provider if: You are coughing up more mucus than usual. There is a change in the color or thickness of the mucus. It is harder to breathe than usual or you are short of breath while you are resting. You need to use your quick relief inhaler more often. You have trouble doing your normal activities such as getting dressed or walking in the house. Your skin color or fingernails turn blue. You have a fever or chills. Get help right away if: You are short of breath and cannot: Talk in full sentences. Do normal activities. You have chest pain. You feel confused. These symptoms may be an emergency. Call 911 right away. Do not wait to see if the symptoms will go away. Do not drive yourself to the hospital. This information is not intended to replace advice given to you by your health care provider. Make sure you discuss any questions you have with your health care provider. Document Revised: 06/09/2023 Document Reviewed: 11/22/2022 Elsevier Patient Education  2024 [employer].

## 2024-07-03 NOTE — Progress Notes (Signed)
 [facility] [address] [place], [place] [address]  Pulmonary Sleep Medicine   Office Visit Note  Patient Name: [name] [name] DOB: 04/30/1956 MRN [id number]  Date of Service: 07/03/2024  Complaints/HPI: States he is still smoking about a half pack. Patient has some cough no sputum production. He feels shortness of breath with exertion. He feels more with going up stairs. Denies having chest pain. No fevers or chill. Has already had his flu shot. He had a PSG done home study which showed moderate OSA but he states he did not follow up. He also had desaturations to 82% with that study. He states he is willing to get a titration done now  Office Spirometry Results: Peak Flow: (!) 7 L/min FEV1: 2.8 liters FVC: 3.37 liters FEV1/FVC: 83.1 % FVC  % Predicted: 71 % FEV % Predicted: 80 % FeF 25-75: 3.07 liters FeF 25-75 % Predicted: 115   ROS  General: (-) fever, (-) chills, (-) night sweats, (-) weakness Skin: (-) rashes, (-) itching,. Eyes: (-) visual changes, (-) redness, (-) itching. Nose and Sinuses: (-) nasal stuffiness or itchiness, (-) postnasal drip, (-) nosebleeds, (-) sinus trouble. Mouth and Throat: (-) sore throat, (-) hoarseness. Neck: (-) swollen glands, (-) enlarged thyroid , (-) neck pain. Respiratory: + cough, (-) bloody sputum, + shortness of breath, + wheezing. Cardiovascular: - ankle swelling, (-) chest pain. Lymphatic: (-) lymph node enlargement. Neurologic: (-) numbness, (-) tingling. Psychiatric: (-) anxiety, (-) depression   Current Medication: Outpatient Encounter Medications as of 07/03/2024  Medication Sig   albuterol  (VENTOLIN  HFA) 108 (90 Base) MCG/ACT inhaler Inhale 2 puffs into the lungs every 6 (six) hours as needed for wheezing or shortness of breath.   allopurinol  (ZYLOPRIM ) 100 MG tablet TAKE 2 TABLETS BY MOUTH DAILY   aspirin  EC 81 MG EC tablet Take 1 tablet (81 mg total) by mouth daily. Swallow whole.   atorvastatin   (LIPITOR ) 80 MG tablet TAKE 1 TABLET(80 MG) BY MOUTH DAILY   buPROPion  (WELLBUTRIN  SR) 150 MG 12 hr tablet TAKE 1 TABLET(150 MG) BY MOUTH TWICE DAILY   butalbital -acetaminophen -caffeine  (FIORICET) 50-325-40 MG tablet Take 1-2 tablets by mouth 2 (two) times daily as needed for headache.   DULoxetine  (CYMBALTA ) 60 MG capsule Take 1 capsule (60 mg total) by mouth daily.   Fluticasone -Umeclidin-Vilant (TRELEGY ELLIPTA ) 100-62.5-25 MCG/ACT AEPB TAKE 1 INHALATION INTO THE LUNGS BY MOUTH DAILY   HYDROcodone -acetaminophen  (NORCO/VICODIN) 5-325 MG tablet Take 1 tablet by mouth daily as needed for moderate pain (pain score 4-6) or severe pain (pain score 7-10).   HYDROcodone -acetaminophen  (NORCO/VICODIN) 5-325 MG tablet Take 1 tablet by mouth daily as needed for moderate pain (pain score 4-6) or severe pain (pain score 7-10).   ibuprofen  (ADVIL ) 800 MG tablet Take 1 tablet (800 mg total) by mouth every 8 (eight) hours as needed for moderate pain.   nitroGLYCERIN  (NITROSTAT ) 0.4 MG SL tablet Place 1 tablet (0.4 mg total) under the tongue every 5 (five) minutes as needed for chest pain.   tadalafil (CIALIS) 10 MG tablet Take 20 mg by mouth daily as needed.   tamsulosin  (FLOMAX ) 0.4 MG CAPS capsule TAKE 1 CAPSULE(0.4 MG) BY MOUTH DAILY   tolterodine  (DETROL  LA) 4 MG 24 hr capsule TAKE 1 CAPSULE BY MOUTH DAILY   varenicline  (CHANTIX ) 1 MG tablet TAKE 1 TABLET(1 MG) BY MOUTH TWICE DAILY   No facility-administered encounter medications on file as of 07/03/2024.    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    Medical History: Past Medical History:  Diagnosis Date   Arthritis    COPD (chronic obstructive pulmonary disease) (HCC)    Depression    Emphysema of lung (HCC)    Gout    left knee   Heart attack (HCC)    Kidney stones    bilateral    Family History: Family History  Problem Relation Age of Onset   Alcohol abuse Mother    Alcohol abuse Father    Alcohol abuse Brother     Social History: Social History   Socioeconomic History   Marital status: Married    Spouse name: Not on file   Number of children: Not on file   Years of education: Not on file   Highest education level: Not on file  Occupational History   Not on file  Tobacco Use   Smoking status: Every Day    Current packs/day: 2.00    Average packs/day: 2.0 packs/day for 52.0 years (104.0 ttl pk-yrs)    Types: Cigarettes   Smokeless tobacco: Never   Tobacco comments:    1/2 pack daily  Vaping Use   Vaping status: Never Used  Substance and Sexual Activity   Alcohol use: Not Currently   Drug use: Never   Sexual activity: Not on file  Other Topics Concern   Not on file  Social History Narrative   Not on file   Social Drivers of Health   Financial Resource Strain: Low Risk  (04/02/2024)   Received from [facility]   Overall Financial Resource Strain (CARDIA)    Difficulty of Paying Living Expenses: Not hard at all  Food Insecurity: No Food Insecurity (04/02/2024)   [facility] from [facility]   Hunger Vital Sign    Within the past 12 months, you worried that your food would run out before you got the money to buy more.: Never true    Within the past 12 months, the food you bought just didn't last and you didn't have money to get more.: Never true  Transportation Needs: No Transportation Needs (04/02/2024)   [facility] from [facility]  Transportation    In the past 12 months, has lack of transportation kept you from medical appointments or from getting medications?: No    Lack of Transportation (Non-Medical): No  Physical Activity: Not on file  Stress: Not on file  Social Connections: Not on file  Intimate Partner Violence: Not on file    Vital Signs: Blood pressure (!) 126/93, pulse 60, temperature 98 F (36.7 C), resp. rate 16, height 6' (1.829 m), weight 209 lb (94.8 kg), SpO2 98%, peak flow (!) 7 L/min.  Examination: General Appearance: The patient is well-developed, well-nourished, and in no distress. Skin: Gross inspection of skin unremarkable. Head: normocephalic, no gross deformities. Eyes: no gross deformities noted. ENT: ears appear grossly normal no exudates. Neck: Supple. No thyromegaly. No LAD. Respiratory: no rhonchi noted. Cardiovascular: Normal S1 and S2 without murmur or rub. Extremities: No  cyanosis. pulses are equal. Neurologic: Alert and oriented. No involuntary movements.  LABS: No results found for this or any previous visit (from the past 2160 hours).  Radiology: MR BRAIN WO CONTRAST Result Date: 04/12/2024 CLINICAL DATA:  Provided history: Mild cognitive impairment. Seizure-like activity. EXAM: MRI HEAD WITHOUT CONTRAST TECHNIQUE: Multiplanar, multiecho pulse sequences of the brain and surrounding structures were obtained without intravenous contrast. COMPARISON:  Head CT 09/07/2023. FINDINGS: A routine protocol non-contrast brain MRI was ordered and performed. Brain: Mild generalized cerebral atrophy. Multifocal T2 FLAIR hyperintense signal abnormality within the cerebral white matter, nonspecific but compatible with mild chronic small vessel ischemic disease. No cortical encephalomalacia is identified. There is no acute infarct. No evidence of an intracranial mass. No chronic intracranial blood products. No extra-axial fluid collection. No midline shift. Vascular: Maintained flow voids within  the proximal large arterial vessels. Skull and upper cervical spine: No focal worrisome marrow lesion. Sinuses/Orbits: No mass or acute finding within the imaged orbits. No significant paranasal sinus disease. IMPRESSION: 1.  No evidence of an acute intracranial abnormality. 2. No specific seizure etiology is identified. 3. Mild chronic small vessel ischemic changes within the cerebral white matter. 4. Mild generalized cerebral atrophy. Electronically Signed   By: Rockey Childs D.O.   On: 04/12/2024 09:17    No results found.  No results found.  Assessment and Plan: Patient Active Problem List   Diagnosis Date Noted   Chronic pain of left knee 05/27/2024   Chronic right-sided low back pain without sciatica 05/27/2024   Prostate cancer (HCC) 10/17/2023   Prediabetes 10/17/2023   Excessive daytime sleepiness 09/15/2023   Aortic atherosclerosis 09/15/2023   Coronary artery disease involving native coronary artery of native heart without angina pectoris    NSTEMI (non-ST elevated myocardial infarction) (HCC) 05/17/2021   Centrilobular emphysema (HCC) 04/07/2021   Cigarette nicotine  dependence with nicotine -induced disorder 04/07/2021   Depression, major, single episode, mild 04/07/2021   History of colonic polyps    Polyp of sigmoid colon    Rectal polyp    Elevated PSA 12/26/2019   Benign prostatic hyperplasia with urinary frequency 12/26/2019   1. SOB (shortness of breath) (Primary) Moderate COPD today looks a little better - Spirometry with Graph  2. Obstructive chronic bronchitis without exacerbation (HCC) States he is using his inhalers 3x per week. States he will try to do better Follow up CPFT  3. OSA (obstructive sleep apnea) Moderate OSA will need a titration done and will schedule this for him - Cpap titration; Future  4. Smoker in home STOP SMOKING    General Counseling: I have discussed the findings of the evaluation and examination with [facility].  I have also discussed  any further diagnostic evaluation thatmay be needed or ordered today. Kahlel verbalizes understanding of the findings of todays visit. We also reviewed his medications today and discussed drug interactions and side effects including but not limited excessive drowsiness and altered mental states. We also discussed that there is always a risk not just to him but also people around him. he has been encouraged to call the office with any questions or concerns that should arise related to todays visit.  Orders Placed This Encounter  Procedures   Spirometry with Graph    Where should this test be performed?:   [facility]     Time spent: 76  I have personally obtained a history, examined the patient, evaluated laboratory and imaging results, formulated the assessment and plan and placed orders.  Elfreda DELENA Bathe, MD [facility] Pulmonary and Critical Care Sleep medicine

## 2024-07-03 NOTE — Telephone Encounter (Signed)
 Cpap titration order emailed to [facility]

## 2024-07-04 ENCOUNTER — Telehealth: Payer: Self-pay

## 2024-07-04 NOTE — Telephone Encounter (Signed)
 Pt seen in office for pulmonary visit, patient notified me during check-in that oxycodone  is not helping. Spoke with AA, and she recommends a pain management referral. Notified patient per AA that she will do this.

## 2024-07-11 ENCOUNTER — Telehealth: Payer: Self-pay

## 2024-07-11 DIAGNOSIS — G8929 Other chronic pain: Secondary | ICD-10-CM

## 2024-07-11 MED ORDER — HYDROCODONE-ACETAMINOPHEN 5-325 MG PO TABS: 1.0000 | ORAL_TABLET | Freq: Every day | ORAL | 0 refills | Status: DC | PRN

## 2024-07-16 NOTE — Telephone Encounter (Signed)
 Done

## 2024-08-04 ENCOUNTER — Other Ambulatory Visit: Payer: Self-pay | Admitting: Nurse Practitioner

## 2024-08-04 DIAGNOSIS — Z79899 Other long term (current) drug therapy: Secondary | ICD-10-CM

## 2024-08-13 ENCOUNTER — Other Ambulatory Visit: Payer: Self-pay | Admitting: Nurse Practitioner

## 2024-08-13 DIAGNOSIS — Z79899 Other long term (current) drug therapy: Secondary | ICD-10-CM

## 2024-08-27 ENCOUNTER — Other Ambulatory Visit: Payer: Self-pay | Admitting: Nurse Practitioner

## 2024-08-27 ENCOUNTER — Telehealth: Payer: Self-pay

## 2024-08-27 DIAGNOSIS — G8929 Other chronic pain: Secondary | ICD-10-CM

## 2024-08-27 MED ORDER — HYDROCODONE-ACETAMINOPHEN 5-325 MG PO TABS: 1.0000 | ORAL_TABLET | Freq: Every day | ORAL | 0 refills | Status: DC | PRN

## 2024-08-27 NOTE — Telephone Encounter (Signed)
 Pt called for pain medication as per alyssa he need to call pain management for appt once he make appt we can  refills his med until his appt

## 2024-09-03 ENCOUNTER — Other Ambulatory Visit: Payer: Self-pay | Admitting: Nurse Practitioner

## 2024-09-03 DIAGNOSIS — Z8739 Personal history of other diseases of the musculoskeletal system and connective tissue: Secondary | ICD-10-CM

## 2024-09-18 ENCOUNTER — Encounter: Payer: Self-pay | Admitting: Nurse Practitioner

## 2024-09-18 ENCOUNTER — Ambulatory Visit (INDEPENDENT_AMBULATORY_CARE_PROVIDER_SITE_OTHER): Admitting: Nurse Practitioner

## 2024-09-18 VITALS — BP 120/70 | HR 68 | Temp 96.9°F | Resp 16 | Ht 72.0 in | Wt 211.8 lb

## 2024-09-18 DIAGNOSIS — E559 Vitamin D deficiency, unspecified: Secondary | ICD-10-CM | POA: Diagnosis not present

## 2024-09-18 DIAGNOSIS — C61 Malignant neoplasm of prostate: Secondary | ICD-10-CM | POA: Diagnosis not present

## 2024-09-18 DIAGNOSIS — I700 Atherosclerosis of aorta: Secondary | ICD-10-CM

## 2024-09-18 DIAGNOSIS — F331 Major depressive disorder, recurrent, moderate: Secondary | ICD-10-CM | POA: Diagnosis not present

## 2024-09-18 DIAGNOSIS — Z0001 Encounter for general adult medical examination with abnormal findings: Secondary | ICD-10-CM | POA: Diagnosis not present

## 2024-09-18 DIAGNOSIS — I252 Old myocardial infarction: Secondary | ICD-10-CM | POA: Diagnosis not present

## 2024-09-18 DIAGNOSIS — G8929 Other chronic pain: Secondary | ICD-10-CM

## 2024-09-18 DIAGNOSIS — M545 Low back pain, unspecified: Secondary | ICD-10-CM | POA: Diagnosis not present

## 2024-09-18 DIAGNOSIS — M25562 Pain in left knee: Secondary | ICD-10-CM | POA: Diagnosis not present

## 2024-09-18 DIAGNOSIS — E538 Deficiency of other specified B group vitamins: Secondary | ICD-10-CM

## 2024-09-18 DIAGNOSIS — F17210 Nicotine dependence, cigarettes, uncomplicated: Secondary | ICD-10-CM | POA: Diagnosis not present

## 2024-09-18 DIAGNOSIS — R7303 Prediabetes: Secondary | ICD-10-CM

## 2024-09-18 MED ORDER — BUPROPION HCL ER (SR) 150 MG PO TB12: ORAL_TABLET | ORAL | 1 refills | Status: AC

## 2024-09-18 MED ORDER — HYDROCODONE-ACETAMINOPHEN 7.5-325 MG PO TABS: 1.0000 | ORAL_TABLET | Freq: Two times a day (BID) | ORAL | 0 refills | Status: DC | PRN

## 2024-09-18 NOTE — Progress Notes (Signed)
 [facility] [address] [place], [place] [address]  Internal MEDICINE  Office Visit Note  Patient Name: [name] [name]  [id number]  [id number]  Date of Service: 09/18/2024  Chief Complaint  Patient presents with   Depression   Medicare Wellness    HPI [name] presents for a medicare annual wellness visit.  Well-appearing 68 y.o. male with prostate cancer, CAD, aortic atherosclerosis,  emphysema, BPH, depression, prediabetes, chronic low back pain, chronic left knee pain, and history of MI. He is currently still smoking and he is due for a lung cancer screening with low dose CT chest. Routine CRC screening: colonoscopy done in 2022, due to repeat in 2027 Labs: due for routine labs New or worsening pain: chronic low back and left knee pain. He easily gets dependent on narcotic pain medications. He has an upcoming visit with pain management in late January for his initial consult.  Other concerns: none     09/18/2024   10:02 AM 09/15/2023   10:00 AM 09/09/2022    8:54 AM  MMSE - Mini Mental State Exam  Orientation to time 5 5 5   Orientation to Place 5 5 5   Registration 3 3 3   Attention/ Calculation 5 5 5   Recall 3 3 3   Language- name 2 objects 2 2 2   Language- repeat 1 1 1   Language- follow 3 step command 3 3 3   Language- read & follow direction 1 1 1   Write a sentence 1 1 1   Copy design 1 1 1   Total score 30 30 30     Functional Status Survey: Is the patient deaf or have difficulty hearing?: Yes Does the patient have difficulty seeing, even when wearing glasses/contacts?: Yes Does the patient have difficulty concentrating, remembering, or making decisions?: Yes Does the patient have difficulty walking or climbing stairs?: No Does the patient have difficulty dressing or bathing?: No Does the patient have difficulty doing errands alone such as visiting a doctor's office or shopping?: No     06/21/2022    3:37 PM 09/09/2022    8:53 AM 12/09/2022    1:24  PM 09/15/2023    9:59 AM 09/18/2024   10:01 AM  Fall Risk  Falls in the past year? 1 0 0 0 1  Was there an injury with Fall? 0  0  0   0  Fall Risk Category Calculator 1 0 0  1  Fall Risk Category (Retired) Low  Low      (RETIRED) Patient Fall Risk Level Low fall risk  Low fall risk      Patient at Risk for Falls Due to Impaired balance/gait No Fall Risks No Fall Risks    Fall risk Follow up Falls evaluation completed  Falls evaluation completed  Falls evaluation completed  Falls evaluation completed     Data saved with a previous flowsheet row definition       09/15/2023   11:27 AM  Depression screen PHQ 2/9  Decreased Interest 0  Down, Depressed, Hopeless 0  PHQ - 2 Score 0        Current Medication: Outpatient Encounter Medications as of 09/18/2024  Medication Sig   HYDROcodone -acetaminophen  (NORCO) 7.5-325 MG tablet Take 1 tablet by mouth 2 (two) times daily as needed for severe pain (pain score 7-10).   albuterol  (VENTOLIN  HFA) 108 (90 Base) MCG/ACT inhaler Inhale 2 puffs into the lungs every 6 (six) hours as needed for wheezing or shortness of breath.   allopurinol  (ZYLOPRIM ) 100 MG tablet  TAKE 2 TABLETS BY MOUTH DAILY   aspirin  EC 81 MG EC tablet Take 1 tablet (81 mg total) by mouth daily. Swallow whole.   atorvastatin  (LIPITOR ) 80 MG tablet TAKE 1 TABLET(80 MG) BY MOUTH DAILY   buPROPion  (WELLBUTRIN  SR) 150 MG 12 hr tablet TAKE 1 TABLET(150 MG) BY MOUTH TWICE DAILY   butalbital -acetaminophen -caffeine  (FIORICET) 50-325-40 MG tablet Take 1-2 tablets by mouth 2 (two) times daily as needed for headache.   DULoxetine  (CYMBALTA ) 60 MG capsule Take 1 capsule (60 mg total) by mouth daily.   Fluticasone -Umeclidin-Vilant (TRELEGY ELLIPTA ) 100-62.5-25 MCG/ACT AEPB TAKE 1 INHALATION INTO THE LUNGS BY MOUTH DAILY   ibuprofen  (ADVIL ) 800 MG tablet Take 1 tablet (800 mg total) by mouth every 8 (eight) hours as needed for moderate pain.   nitroGLYCERIN  (NITROSTAT ) 0.4 MG SL tablet  Place 1 tablet (0.4 mg total) under the tongue every 5 (five) minutes as needed for chest pain.   tadalafil (CIALIS) 10 MG tablet Take 20 mg by mouth daily as needed.   tamsulosin  (FLOMAX ) 0.4 MG CAPS capsule TAKE 1 CAPSULE(0.4 MG) BY MOUTH DAILY   tolterodine  (DETROL  LA) 4 MG 24 hr capsule TAKE 1 CAPSULE BY MOUTH DAILY   [DISCONTINUED] buPROPion  (WELLBUTRIN  SR) 150 MG 12 hr tablet TAKE 1 TABLET(150 MG) BY MOUTH TWICE DAILY   [DISCONTINUED] HYDROcodone -acetaminophen  (NORCO/VICODIN) 5-325 MG tablet Take 1 tablet by mouth daily as needed for moderate pain (pain score 4-6) or severe pain (pain score 7-10).   [DISCONTINUED] varenicline  (CHANTIX ) 1 MG tablet TAKE 1 TABLET(1 MG) BY MOUTH TWICE DAILY   No facility-administered encounter medications on file as of 09/18/2024.    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    Medical History: Past Medical History:  Diagnosis Date   Arthritis    COPD (chronic obstructive pulmonary disease) (HCC)    Depression    Emphysema of lung (HCC)    Gout    left knee   Heart attack (HCC)    Kidney stones    bilateral    Family History: Family History  Problem Relation Age of Onset   Alcohol abuse Mother    Alcohol abuse Father    Alcohol abuse Brother     Social History   Socioeconomic History   Marital status: Married    Spouse name: Not on file   Number of children: Not on file   Years of education: Not on file   Highest education level: Not on  file  Occupational History   Not on file  Tobacco Use   Smoking status: Every Day    Current packs/day: 2.00    Average packs/day: 2.0 packs/day for 52.0 years (104.0 ttl pk-yrs)    Types: Cigarettes   Smokeless tobacco: Never   Tobacco comments:    1/2 pack daily  Vaping Use   Vaping status: Never Used  Substance and Sexual Activity   Alcohol use: Not Currently   Drug use: Never   Sexual activity: Not on file  Other Topics Concern  Not on file  Social History Narrative   Not on file   Social Drivers of Health   Tobacco Use: High Risk (09/18/2024)   Patient History    Smoking Tobacco Use: Every Day    Smokeless Tobacco Use: Never    Passive Exposure: Not on file  Financial Resource Strain: Low Risk  (04/02/2024)   Received from [facility]   Overall Financial Resource Strain (CARDIA)    Difficulty of Paying Living Expenses: Not hard at all  Food Insecurity: No Food Insecurity (04/02/2024)   Received from [facility]   Epic    Within the past 12 months, you worried that your food would run out before you got the money to buy more.: Never true    Within the past 12 months, the food you bought just didn't last and you didn't have money to get more.: Never true  Transportation Needs: No Transportation Needs (04/02/2024)   Received from [facility] - Transportation    In the past 12 months, has lack of transportation kept you from medical appointments or from getting medications?: No    Lack of Transportation (Non-Medical): No  Physical Activity: Not on file  Stress: Not on file  Social Connections: Not on file  Intimate Partner Violence: Not on file  Depression (PHQ2-9): Low Risk (09/15/2023)   Depression (PHQ2-9)    PHQ-2 Score: 0  Alcohol Screen: Low Risk (06/21/2022)   Alcohol Screen    Last Alcohol Screening Score (AUDIT): 0  Housing: Low Risk  (04/02/2024)   Received from [facility]    Epic    In the last 12 months, was there a time when you were not able to pay the mortgage or rent on time?: No    In the past 12 months, how many times have you moved where you were living?: 0    At any time in the past 12 months, were you homeless or living in a shelter (including now)?: No  Utilities: Not At Risk (04/02/2024)   Received from [facility]   Epic    In the past 12 months has the electric, gas, oil, or water company threatened to shut off services in your home?: No  Health Literacy: Not on file      Review of Systems  Constitutional:  Negative for activity change, appetite change, chills, fatigue, fever and unexpected weight change.  HENT: Negative.  Negative for congestion, ear pain, rhinorrhea, sore throat and trouble swallowing.   Eyes: Negative.   Respiratory: Negative.  Negative for cough, chest tightness, shortness of breath and wheezing.   Cardiovascular: Negative.  Negative for chest pain.  Gastrointestinal: Negative.  Negative for abdominal pain, blood in stool, constipation, diarrhea, nausea and vomiting.  Endocrine: Negative.   Genitourinary: Negative.  Negative for difficulty urinating, dysuria, frequency, hematuria and urgency.  Musculoskeletal:  Positive for arthralgias. Negative for back pain, joint swelling, myalgias and neck pain.       Left knee pain  Skin: Negative.  Negative for rash and wound.  Allergic/Immunologic: Negative.  Negative for immunocompromised state.  Neurological: Negative.  Negative for dizziness, seizures, numbness and headaches.  Hematological: Negative.   Psychiatric/Behavioral:  Negative for behavioral problems, self-injury and suicidal ideas. The patient is not nervous/anxious.     Vital Signs: BP 120/70   Pulse 68   Temp (!) 96.9 F (36.1 C)   Resp 16   Ht 6' (1.829  m)   Wt 211 lb 12.8 oz (96.1 kg)   SpO2 97%   BMI 28.73 kg/m    Physical Exam Vitals reviewed.  Constitutional:      General: He  is awake. He is not in acute distress.    Appearance: Normal appearance. He is well-developed and well-groomed. He is obese. He is not ill-appearing or diaphoretic.  HENT:     Head: Normocephalic and atraumatic.     Mouth/Throat:     Lips: Pink.     Pharynx: Uvula midline.  Eyes:     General: Lids are normal. Vision grossly intact. Gaze aligned appropriately.     Extraocular Movements: Extraocular movements intact.     Conjunctiva/sclera: Conjunctivae normal.     Pupils: Pupils are equal, round, and reactive to light.     Funduscopic exam:    Right eye: Red reflex present.        Left eye: Red reflex present. Neck:     Thyroid : No thyromegaly.     Vascular: No JVD.     Trachea: Trachea and phonation normal. No tracheal deviation.  Cardiovascular:     Rate and Rhythm: Normal rate and regular rhythm.     Pulses: Normal pulses.     Heart sounds: Normal heart sounds, S1 normal and S2 normal. No murmur heard.    No friction rub. No gallop.  Pulmonary:     Effort: Pulmonary effort is normal. No accessory muscle usage or respiratory distress.     Breath sounds: Normal breath sounds and air entry. No stridor. No wheezing or rales.  Chest:     Chest wall: No tenderness.  Abdominal:     General: Bowel sounds are normal. There is no distension.     Palpations: Abdomen is soft. There is no shifting dullness, fluid wave or pulsatile mass.     Tenderness: There is no abdominal tenderness. There is no guarding.  Musculoskeletal:     Cervical back: Neck supple.  Skin:    General: Skin is warm and dry.     Capillary Refill: Capillary refill takes less than 2 seconds.     Coloration: Skin is not pale.     Findings: No erythema or rash.  Neurological:     Mental Status: He is alert and oriented to person, place, and time.     Cranial Nerves: No cranial nerve deficit.     Motor: No abnormal muscle tone.     Coordination: Coordination normal.     Gait: Gait normal.     Deep Tendon Reflexes:  Reflexes are normal and symmetric.  Psychiatric:        Mood and Affect: Mood normal.        Behavior: Behavior normal. Behavior is cooperative.        Assessment/Plan: 1. Encounter for Medicare annual examination with abnormal findings (Primary) Age-appropriate preventive screenings and vaccinations discussed. Routine labs for health maintenance ordered, see below. PHM updated.   - CBC with Differential/Platelet - CMP14+EGFR - Lipid Profile - PSA Total (Reflex To Free) - B12 and Folate Panel - Vitamin D  (25 hydroxy) - Hgb A1C w/o eAG  2. Aortic atherosclerosis Routine labs ordered  - CBC with Differential/Platelet - CMP14+EGFR - Lipid Profile  3. Prediabetes Routine labs ordered  - CBC with Differential/Platelet - CMP14+EGFR - Lipid Profile - Hgb A1C w/o eAG  4. History of MI (myocardial infarction) Continue medications as prescribed.   5. Prostate cancer (HCC) Routine lab ordered  - PSA Total (  Reflex To Free)  6. Chronic right-sided low back pain without sciatica Continue hydrocodone  at current dose as prescribed until patient is seen by pain management clinic  - HYDROcodone -acetaminophen  (NORCO) 7.5-325 MG tablet; Take 1 tablet by mouth 2 (two) times daily as needed for severe pain (pain score 7-10).  Dispense: 60 tablet; Refill: 0  7. Chronic pain of left knee Continue hydrocodone  at current dose as prescribed until patient is seen by pain management clinic  - HYDROcodone -acetaminophen  (NORCO) 7.5-325 MG tablet; Take 1 tablet by mouth 2 (two) times daily as needed for severe pain (pain score 7-10).  Dispense: 60 tablet; Refill: 0  8. B12 deficiency Routine labs ordered - CBC with Differential/Platelet - B12 and Folate Panel  9. Vitamin D  deficiency Routine lab ordered - Vitamin D  (25 hydroxy)  10. Smokes one pack per day or less and motivated to quit Low dose CT chest ordered  - CT CHEST LUNG CA SCREEN LOW DOSE W/O CM; Future  11. Moderate episode of  recurrent major depressive disorder (HCC) Continue bupropion  as prescribed  - buPROPion  (WELLBUTRIN  SR) 150 MG 12 hr tablet; TAKE 1 TABLET(150 MG) BY MOUTH TWICE DAILY  Dispense: 180 tablet; Refill: 1     General Counseling: Candon verbalizes understanding of the findings of todays visit and agrees with plan of treatment. I have discussed any further diagnostic evaluation that may be needed or ordered today. We also reviewed his medications today. he has been encouraged to call the office with any questions or concerns that should arise related to todays visit.    Orders Placed This Encounter  Procedures   CT CHEST LUNG CA SCREEN LOW DOSE W/O CM   CBC with Differential/Platelet   CMP14+EGFR   Lipid Profile   PSA Total (Reflex To Free)   B12 and Folate Panel   Vitamin D  (25 hydroxy)   Hgb A1C w/o eAG    Meds ordered this encounter  Medications   HYDROcodone -acetaminophen  (NORCO) 7.5-325 MG tablet    Sig: Take 1 tablet by mouth 2 (two) times daily as needed for severe pain (pain score 7-10).    Dispense:  60 tablet    Refill:  0    Fill new script today, note increased dose and change in frequency, patient has appt with pain management next month   buPROPion  (WELLBUTRIN  SR) 150 MG 12 hr tablet    Sig: TAKE 1 TABLET(150 MG) BY MOUTH TWICE DAILY    Dispense:  180 tablet    Refill:  1    Return in about 1 month (around 10/19/2024) for F/U, Labs, Jahmani Staup PCP.   Total time spent:30 Minutes Time spent includes review of chart, medications, test results, and follow up plan with the patient.   [place] Controlled Substance Database was reviewed by me.  This patient was seen by Mardy Maxin, FNP-C in collaboration with Dr. Sigrid Bathe as a part of collaborative care agreement.  Tijuan Dantes R. Maxin, MSN, FNP-C Internal medicine

## 2024-09-25 ENCOUNTER — Inpatient Hospital Stay
Admission: RE | Admit: 2024-09-25 | Discharge: 2024-09-25 | Disposition: A | Source: Ambulatory Visit | Attending: Nurse Practitioner

## 2024-09-25 DIAGNOSIS — F17210 Nicotine dependence, cigarettes, uncomplicated: Secondary | ICD-10-CM

## 2024-09-28 ENCOUNTER — Telehealth: Payer: Self-pay | Admitting: Internal Medicine

## 2024-09-28 NOTE — Telephone Encounter (Signed)
 Per [facility], 12/28/23 appointment was cancelled. No note for a follow up. I emailed them new order 07/03/24-Toni

## 2024-10-04 ENCOUNTER — Ambulatory Visit: Payer: Self-pay | Admitting: Nurse Practitioner

## 2024-10-15 ENCOUNTER — Ambulatory Visit: Admitting: Pain Medicine

## 2024-10-22 ENCOUNTER — Telehealth: Payer: Self-pay | Admitting: Nurse Practitioner

## 2024-10-22 MED ORDER — HYDROCODONE-ACETAMINOPHEN 5-325 MG PO TABS: 1.0000 | ORAL_TABLET | Freq: Two times a day (BID) | ORAL | 0 refills | Status: AC | PRN

## 2024-10-23 ENCOUNTER — Ambulatory Visit: Admitting: Pain Medicine

## 2024-10-23 NOTE — Telephone Encounter (Signed)
Patient wife will have him call the office back

## 2024-10-23 NOTE — Telephone Encounter (Signed)
 Patient notified

## 2024-10-30 ENCOUNTER — Ambulatory Visit: Admitting: Nurse Practitioner

## 2024-11-06 ENCOUNTER — Ambulatory Visit: Admitting: Internal Medicine

## 2024-11-19 ENCOUNTER — Ambulatory Visit: Admitting: Pain Medicine

## 2025-09-19 ENCOUNTER — Ambulatory Visit: Admitting: Nurse Practitioner
